# Patient Record
Sex: Female | Born: 1940
Health system: Southern US, Community
[De-identification: ages and names within clinical notes are randomized; demographics above are authoritative.]

## PROBLEM LIST (undated history)

## (undated) DIAGNOSIS — S62309A Unspecified fracture of unspecified metacarpal bone, initial encounter for closed fracture: Secondary | ICD-10-CM

## (undated) DIAGNOSIS — T148XXA Other injury of unspecified body region, initial encounter: Secondary | ICD-10-CM

## (undated) DIAGNOSIS — Z923 Personal history of irradiation: Secondary | ICD-10-CM

## (undated) DIAGNOSIS — T8859XA Other complications of anesthesia, initial encounter: Secondary | ICD-10-CM

## (undated) DIAGNOSIS — Z9289 Personal history of other medical treatment: Secondary | ICD-10-CM

## (undated) DIAGNOSIS — C50919 Malignant neoplasm of unspecified site of unspecified female breast: Secondary | ICD-10-CM

## (undated) DIAGNOSIS — M810 Age-related osteoporosis without current pathological fracture: Secondary | ICD-10-CM

## (undated) DIAGNOSIS — N8501 Benign endometrial hyperplasia: Secondary | ICD-10-CM

## (undated) DIAGNOSIS — E039 Hypothyroidism, unspecified: Secondary | ICD-10-CM

## (undated) DIAGNOSIS — M5020 Other cervical disc displacement, unspecified cervical region: Secondary | ICD-10-CM

## (undated) DIAGNOSIS — T4145XA Adverse effect of unspecified anesthetic, initial encounter: Secondary | ICD-10-CM

## (undated) DIAGNOSIS — N840 Polyp of corpus uteri: Secondary | ICD-10-CM

## (undated) DIAGNOSIS — C4441 Basal cell carcinoma of skin of scalp and neck: Secondary | ICD-10-CM

## (undated) HISTORY — PX: TONSILLECTOMY: SUR1361

## (undated) HISTORY — DX: Personal history of other medical treatment: Z92.89

## (undated) HISTORY — PX: OTHER SURGICAL HISTORY: SHX169

## (undated) HISTORY — DX: Malignant neoplasm of unspecified site of unspecified female breast: C50.919

## (undated) HISTORY — DX: Basal cell carcinoma of skin of scalp and neck: C44.41

## (undated) HISTORY — DX: Personal history of irradiation: Z92.3

## (undated) HISTORY — DX: Benign endometrial hyperplasia: N85.01

## (undated) HISTORY — DX: Polyp of corpus uteri: N84.0

## (undated) HISTORY — DX: Unspecified fracture of unspecified metacarpal bone, initial encounter for closed fracture: S62.309A

## (undated) HISTORY — DX: Age-related osteoporosis without current pathological fracture: M81.0

## (undated) HISTORY — DX: Other injury of unspecified body region, initial encounter: T14.8XXA

## (undated) HISTORY — DX: Hypothyroidism, unspecified: E03.9

---
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
## 1969-04-01 DIAGNOSIS — Z9289 Personal history of other medical treatment: Secondary | ICD-10-CM

## 1969-04-01 HISTORY — DX: Personal history of other medical treatment: Z92.89

## 1991-08-02 DIAGNOSIS — Z8489 Family history of other specified conditions: Secondary | ICD-10-CM

## 1991-08-02 HISTORY — DX: Family history of other specified conditions: Z84.89

## 1997-10-23 ENCOUNTER — Other Ambulatory Visit: Admission: RE | Admit: 1997-10-23 | Discharge: 1997-10-23 | Payer: Self-pay | Admitting: Obstetrics and Gynecology

## 1999-05-21 ENCOUNTER — Encounter: Admission: RE | Admit: 1999-05-21 | Discharge: 1999-05-21 | Payer: Self-pay | Admitting: Obstetrics and Gynecology

## 1999-05-21 ENCOUNTER — Encounter: Payer: Self-pay | Admitting: Obstetrics and Gynecology

## 1999-07-02 HISTORY — PX: HYSTEROSCOPY: SHX211

## 1999-07-06 ENCOUNTER — Ambulatory Visit (HOSPITAL_COMMUNITY): Admission: RE | Admit: 1999-07-06 | Discharge: 1999-07-06 | Payer: Self-pay | Admitting: Obstetrics and Gynecology

## 2000-01-05 ENCOUNTER — Encounter: Payer: Self-pay | Admitting: Obstetrics and Gynecology

## 2000-01-05 ENCOUNTER — Encounter: Admission: RE | Admit: 2000-01-05 | Discharge: 2000-01-05 | Payer: Self-pay | Admitting: Obstetrics and Gynecology

## 2001-06-13 ENCOUNTER — Encounter: Payer: Self-pay | Admitting: Obstetrics and Gynecology

## 2001-06-13 ENCOUNTER — Encounter: Admission: RE | Admit: 2001-06-13 | Discharge: 2001-06-13 | Payer: Self-pay | Admitting: Obstetrics and Gynecology

## 2001-06-21 ENCOUNTER — Other Ambulatory Visit: Admission: RE | Admit: 2001-06-21 | Discharge: 2001-06-21 | Payer: Self-pay | Admitting: Obstetrics and Gynecology

## 2002-06-17 ENCOUNTER — Encounter: Payer: Self-pay | Admitting: Internal Medicine

## 2002-06-17 ENCOUNTER — Encounter: Admission: RE | Admit: 2002-06-17 | Discharge: 2002-06-17 | Payer: Self-pay | Admitting: Internal Medicine

## 2002-07-15 ENCOUNTER — Other Ambulatory Visit: Admission: RE | Admit: 2002-07-15 | Discharge: 2002-07-15 | Payer: Self-pay | Admitting: *Deleted

## 2003-06-25 ENCOUNTER — Encounter: Admission: RE | Admit: 2003-06-25 | Discharge: 2003-06-25 | Payer: Self-pay | Admitting: Internal Medicine

## 2004-07-06 ENCOUNTER — Encounter: Admission: RE | Admit: 2004-07-06 | Discharge: 2004-07-06 | Payer: Self-pay | Admitting: Internal Medicine

## 2004-07-19 ENCOUNTER — Encounter: Admission: RE | Admit: 2004-07-19 | Discharge: 2004-07-19 | Payer: Self-pay | Admitting: Internal Medicine

## 2005-04-15 ENCOUNTER — Other Ambulatory Visit: Admission: RE | Admit: 2005-04-15 | Discharge: 2005-04-15 | Payer: Self-pay | Admitting: *Deleted

## 2005-08-04 ENCOUNTER — Encounter: Admission: RE | Admit: 2005-08-04 | Discharge: 2005-08-04 | Payer: Self-pay | Admitting: Internal Medicine

## 2006-07-10 ENCOUNTER — Encounter: Admission: RE | Admit: 2006-07-10 | Discharge: 2006-07-10 | Payer: Self-pay | Admitting: Internal Medicine

## 2006-08-07 ENCOUNTER — Encounter: Admission: RE | Admit: 2006-08-07 | Discharge: 2006-08-07 | Payer: Self-pay | Admitting: Internal Medicine

## 2007-08-06 ENCOUNTER — Other Ambulatory Visit: Admission: RE | Admit: 2007-08-06 | Discharge: 2007-08-06 | Payer: Self-pay | Admitting: Obstetrics & Gynecology

## 2007-08-09 ENCOUNTER — Encounter: Admission: RE | Admit: 2007-08-09 | Discharge: 2007-08-09 | Payer: Self-pay | Admitting: Internal Medicine

## 2008-07-14 ENCOUNTER — Encounter: Admission: RE | Admit: 2008-07-14 | Discharge: 2008-07-14 | Payer: Self-pay | Admitting: Internal Medicine

## 2008-08-11 ENCOUNTER — Encounter: Admission: RE | Admit: 2008-08-11 | Discharge: 2008-08-11 | Payer: Self-pay | Admitting: Internal Medicine

## 2008-08-11 ENCOUNTER — Other Ambulatory Visit: Admission: RE | Admit: 2008-08-11 | Discharge: 2008-08-11 | Payer: Self-pay | Admitting: Obstetrics & Gynecology

## 2009-08-12 ENCOUNTER — Encounter: Admission: RE | Admit: 2009-08-12 | Discharge: 2009-08-12 | Payer: Self-pay | Admitting: Internal Medicine

## 2010-07-15 ENCOUNTER — Encounter
Admission: RE | Admit: 2010-07-15 | Discharge: 2010-07-15 | Payer: Self-pay | Source: Home / Self Care | Attending: Internal Medicine | Admitting: Internal Medicine

## 2010-08-13 ENCOUNTER — Encounter
Admission: RE | Admit: 2010-08-13 | Discharge: 2010-08-13 | Payer: Self-pay | Source: Home / Self Care | Attending: Internal Medicine | Admitting: Internal Medicine

## 2010-08-22 ENCOUNTER — Encounter: Payer: Self-pay | Admitting: Internal Medicine

## 2010-12-10 ENCOUNTER — Emergency Department (HOSPITAL_BASED_OUTPATIENT_CLINIC_OR_DEPARTMENT_OTHER): Admission: EM | Admit: 2010-12-10 | Payer: Medicare Other | Source: Home / Self Care

## 2010-12-10 ENCOUNTER — Emergency Department (HOSPITAL_BASED_OUTPATIENT_CLINIC_OR_DEPARTMENT_OTHER)
Admission: EM | Admit: 2010-12-10 | Discharge: 2010-12-10 | Disposition: A | Discharge status: Home / Self Care | Payer: Medicare Other | Attending: Emergency Medicine | Admitting: Emergency Medicine

## 2010-12-10 DIAGNOSIS — H81399 Other peripheral vertigo, unspecified ear: Secondary | ICD-10-CM | POA: Insufficient documentation

## 2010-12-10 DIAGNOSIS — M810 Age-related osteoporosis without current pathological fracture: Secondary | ICD-10-CM | POA: Insufficient documentation

## 2010-12-10 DIAGNOSIS — E039 Hypothyroidism, unspecified: Secondary | ICD-10-CM | POA: Insufficient documentation

## 2010-12-17 NOTE — Op Note (Signed)
[facility]  Patient:    Jamie Cole                      MRN: [id number] Proc. Date: 07/06/99 Adm. Date:  [id number] Attending:  Amanda Cockayne                           Operative Report  PREOPERATIVE DIAGNOSIS:       Thick endometrium on ultrasound.  POSTOPERATIVE DIAGNOSIS:      Thick endometrium on ultrasound.  Probably endometrial polyp or fibroid, I could not be sure.  OPERATION:                    Resection of endometrial growth, anterior wall. Suture of cervix.  SURGEON:                      Esmeralda Arthur, M.D.  ASSISTANT:  ANESTHESIA:                   General anesthesia.  PACKS:                        None.  ESTIMATED BLOOD LOSS:  DESCRIPTION OF PROCEDURE:     The patient was carried to the operating room and  after satisfactory general anesthesia, placed in the lithotomy position and prepped and draped in the usual sterile fashion.  Examination revealed the uterus to feel anterior.  I could feel no mass of the adnexa.  A weighted speculum was placed in the posterior vagina.  The patient has a large enterocele, it was hard to see the cervix.  We finally grasped the cervix.  She was easily sounded to 7 cm.  We then dilated her to a #23 Hegar and looked with the  observation scope.  There was an anterior growth coming from the uterus and I think a lateral posterior.  I then could see under this to see the tubal openings.  I  then changed and then dilated her to a #33.  We did tear the cervix anteriorly dilating her because of the pressure.  She has an atrophic cervix.  We then put the resectoscope in, pressure of 90.  We resected this area.  The monitor was fluxuating from 40 to 80 and then when we were through, it suddenly went to 350.  I was not aware of perforating the uterus and I wonder if it was just that the machine was not measuring accurately.  However, we did stop.  We observed the bleeding.   She did not have excess bleeding. She did have some bleeding from the cervix.  So I put two sutures in the anterior cervix of 0 chromic.  The patient tolerated the procedure well and was carried to the recovery room in good condition. DD:  07/06/99 TD:  07/07/99 Job: [id number]

## 2011-07-19 ENCOUNTER — Other Ambulatory Visit: Payer: Self-pay | Admitting: Internal Medicine

## 2011-07-19 DIAGNOSIS — Z1231 Encounter for screening mammogram for malignant neoplasm of breast: Secondary | ICD-10-CM

## 2011-08-08 ENCOUNTER — Encounter: Payer: Self-pay | Admitting: Internal Medicine

## 2011-08-11 DIAGNOSIS — L820 Inflamed seborrheic keratosis: Secondary | ICD-10-CM | POA: Diagnosis not present

## 2011-08-11 DIAGNOSIS — L821 Other seborrheic keratosis: Secondary | ICD-10-CM | POA: Diagnosis not present

## 2011-08-11 DIAGNOSIS — D485 Neoplasm of uncertain behavior of skin: Secondary | ICD-10-CM | POA: Diagnosis not present

## 2011-08-11 DIAGNOSIS — Z411 Encounter for cosmetic surgery: Secondary | ICD-10-CM | POA: Diagnosis not present

## 2011-08-11 DIAGNOSIS — L439 Lichen planus, unspecified: Secondary | ICD-10-CM | POA: Diagnosis not present

## 2011-08-15 ENCOUNTER — Ambulatory Visit
Admission: RE | Admit: 2011-08-15 | Discharge: 2011-08-15 | Disposition: A | Discharge status: Home / Self Care | Payer: Medicare Other | Source: Ambulatory Visit | Attending: Internal Medicine | Admitting: Internal Medicine

## 2011-08-15 DIAGNOSIS — Z1231 Encounter for screening mammogram for malignant neoplasm of breast: Secondary | ICD-10-CM | POA: Diagnosis not present

## 2011-08-19 ENCOUNTER — Other Ambulatory Visit: Payer: Self-pay | Admitting: Internal Medicine

## 2011-08-19 DIAGNOSIS — R928 Other abnormal and inconclusive findings on diagnostic imaging of breast: Secondary | ICD-10-CM

## 2011-08-21 ENCOUNTER — Emergency Department (HOSPITAL_BASED_OUTPATIENT_CLINIC_OR_DEPARTMENT_OTHER)
Admission: EM | Admit: 2011-08-21 | Discharge: 2011-08-22 | Disposition: A | Discharge status: Home / Self Care | Payer: Medicare Other | Attending: Emergency Medicine | Admitting: Emergency Medicine

## 2011-08-21 ENCOUNTER — Encounter (HOSPITAL_BASED_OUTPATIENT_CLINIC_OR_DEPARTMENT_OTHER): Payer: Self-pay | Admitting: *Deleted

## 2011-08-21 DIAGNOSIS — M545 Low back pain, unspecified: Secondary | ICD-10-CM | POA: Insufficient documentation

## 2011-08-21 NOTE — ED Notes (Signed)
Pt states she fell a couple of weeks ago and tonight "almost couldn't get out of the bed"

## 2011-08-21 NOTE — ED Provider Notes (Signed)
History   This chart was scribed for Hanley Seamen, MD by Melba Coon. The patient was seen in room MH04/MH04 and the patient's care was started at 11:35PM.    CSN: [id number]  Arrival date & time 08/21/11  2201   First MD Initiated Contact with Patient 08/21/11 2332      Chief Complaint  Patient presents with  . Back Pain    (Consider location/radiation/quality/duration/timing/severity/associated sxs/prior treatment) HPI Jamie Cole is a 71 y.o. female who presents to the Emergency Department complaining of constant, moderate to severe lower back pain with an onset 2 weeks ago. Pt was involved in a fall forward towards a table and the impact involved her being crunched over the table (did not fall flat). After fall, pt was sore all over but was ambulatory. Pt has been "self-doctoring" her pain with her own pain meds and hot showers, but pt has been having a hard time getting out of bed and her back has been "locking up". Her whole back aches but the pain is worse in her lower back. Sitting up aggravates the pain. No numbness or weakness or change in bowel patterns.    History reviewed. No pertinent past medical history.  Past Surgical History  Procedure Date  . Endometrial polyp   . Tonsillectomy     History reviewed. No pertinent family history.  History  Substance Use Topics  . Smoking status: Never Smoker   . Smokeless tobacco: Not on file  . Alcohol Use: No   Pt is an Tourist information centre manager.  OB History    Grav Para Term Preterm Abortions TAB SAB Ect Mult Living                  Review of Systems 10 Systems reviewed and are negative for acute change except as noted in the HPI.  Allergies  Review of patient's allergies indicates no known allergies.  Home Medications   Current Outpatient Rx  Name Route Sig Dispense Refill  . ASPIRIN EC 81 MG PO TBEC Oral Take 81 mg by mouth daily.    Marland Kitchen LEVOTHYROXINE SODIUM 112 MCG PO TABS Oral Take 112 mcg by  mouth daily.    Marland Kitchen VITAMIN D (ERGOCALCIFEROL) 50000 UNITS PO CAPS Oral Take 50,000 Units by mouth every 14 (fourteen) days.      BP 160/73  Pulse 90  Temp(Src) 97.9 F (36.6 C) (Oral)  Resp 18  Ht 5\' 5"  (1.651 m)  Wt 205 lb (92.987 kg)  BMI 34.11 kg/m2  SpO2 100%  Physical Exam  Constitutional: She is oriented to person, place, and time. She appears well-developed.  HENT:  Head: Normocephalic and atraumatic.  Right Ear: External ear normal.  Left Ear: External ear normal.  Eyes: Conjunctivae and EOM are normal. Pupils are equal, round, and reactive to light. No scleral icterus.  Neck: Normal range of motion. Neck supple. No thyromegaly present.  Cardiovascular: Normal rate, regular rhythm and normal heart sounds.  Exam reveals no gallop and no friction rub.   No murmur heard. Pulmonary/Chest: Effort normal and breath sounds normal. No stridor. She has no wheezes. She has no rales. She exhibits no tenderness.  Abdominal: Soft. She exhibits no distension. There is no tenderness. There is no rebound.  Musculoskeletal: Normal range of motion. She exhibits tenderness (Bilateral sacroiliac tenderness). She exhibits no edema.       Straight leg raise test  Lymphadenopathy:    She has no cervical adenopathy.  Neurological: She is  alert and oriented to person, place, and time. Coordination normal.  Skin: No rash noted. No erythema.  Psychiatric: She has a normal mood and affect. Her behavior is normal.    ED Course  Procedures (including critical care time)  DIAGNOSTIC STUDIES: Oxygen Saturation is 100% on room air, normal by my interpretation.       MDM  Nursing notes and vitals signs, including pulse oximetry, reviewed.  Summary of this visit's results, reviewed by myself:   Imaging Studies: Dg Lumbar Spine Complete  08/22/2011  *RADIOLOGY REPORT*  Clinical Data: Status post fall 2 weeks ago, with lower back pain.  LUMBAR SPINE - COMPLETE 4+ VIEW  Comparison: None.   Findings: There is no evidence of acute fracture or subluxation. Mild grade 1 anterolisthesis of L4 on L5 appears to reflect facet disease.  Vertebral bodies demonstrate normal height and alignment. Intervertebral disc spaces are grossly preserved.  The visualized bowel gas pattern is unremarkable in appearance; air and stool are noted within the colon.  The sacroiliac joints are within normal limits.  IMPRESSION:  1.  No evidence of acute fracture or subluxation along the lumbar spine. 2.  Mild grade 1 anterolisthesis of L4 on L5 appears to reflect underlying facet disease.  Original Report Authenticated By: Tonia Ghent, M.D.      I personally performed the services described in this documentation, which was scribed in my presence.  The recorded information has been reviewed and considered.          Hanley Seamen, MD 08/22/11 [phone]

## 2011-08-22 ENCOUNTER — Emergency Department (INDEPENDENT_AMBULATORY_CARE_PROVIDER_SITE_OTHER): Payer: Medicare Other

## 2011-08-22 DIAGNOSIS — W19XXXA Unspecified fall, initial encounter: Secondary | ICD-10-CM | POA: Diagnosis not present

## 2011-08-22 DIAGNOSIS — M545 Low back pain, unspecified: Secondary | ICD-10-CM | POA: Diagnosis not present

## 2011-08-22 MED ORDER — NAPROXEN 250 MG PO TABS
500.0000 mg | ORAL_TABLET | Freq: Once | ORAL | Status: AC
  Administered 2011-08-22: 500 mg via ORAL
  Filled 2011-08-22: qty 2

## 2011-08-22 MED ORDER — HYDROCODONE-ACETAMINOPHEN 5-325 MG PO TABS
1.0000 | ORAL_TABLET | Freq: Once | ORAL | Status: AC
  Administered 2011-08-22: 1 via ORAL
  Filled 2011-08-22: qty 1

## 2011-08-22 MED ORDER — HYDROCODONE-ACETAMINOPHEN 5-325 MG PO TABS: 1.0000 | ORAL_TABLET | Freq: Four times a day (QID) | ORAL | Status: AC | PRN

## 2011-08-31 ENCOUNTER — Ambulatory Visit
Admission: RE | Admit: 2011-08-31 | Discharge: 2011-08-31 | Disposition: A | Discharge status: Home / Self Care | Payer: BC Managed Care – PPO | Source: Ambulatory Visit | Attending: Internal Medicine | Admitting: Internal Medicine

## 2011-08-31 ENCOUNTER — Other Ambulatory Visit: Payer: Self-pay | Admitting: Internal Medicine

## 2011-08-31 ENCOUNTER — Ambulatory Visit
Admission: RE | Admit: 2011-08-31 | Discharge: 2011-08-31 | Disposition: A | Discharge status: Home / Self Care | Payer: Medicare Other | Source: Ambulatory Visit | Attending: Internal Medicine | Admitting: Internal Medicine

## 2011-08-31 DIAGNOSIS — N63 Unspecified lump in unspecified breast: Secondary | ICD-10-CM

## 2011-08-31 DIAGNOSIS — C50419 Malignant neoplasm of upper-outer quadrant of unspecified female breast: Secondary | ICD-10-CM | POA: Diagnosis not present

## 2011-08-31 DIAGNOSIS — R928 Other abnormal and inconclusive findings on diagnostic imaging of breast: Secondary | ICD-10-CM

## 2011-08-31 DIAGNOSIS — C50919 Malignant neoplasm of unspecified site of unspecified female breast: Secondary | ICD-10-CM

## 2011-08-31 HISTORY — DX: Malignant neoplasm of unspecified site of unspecified female breast: C50.919

## 2011-09-01 ENCOUNTER — Other Ambulatory Visit: Payer: Self-pay | Admitting: Internal Medicine

## 2011-09-01 ENCOUNTER — Ambulatory Visit
Admission: RE | Admit: 2011-09-01 | Discharge: 2011-09-01 | Disposition: A | Discharge status: Home / Self Care | Payer: Medicare Other | Source: Ambulatory Visit | Attending: Internal Medicine | Admitting: Internal Medicine

## 2011-09-01 DIAGNOSIS — N63 Unspecified lump in unspecified breast: Secondary | ICD-10-CM | POA: Diagnosis not present

## 2011-09-01 DIAGNOSIS — C50911 Malignant neoplasm of unspecified site of right female breast: Secondary | ICD-10-CM

## 2011-09-01 DIAGNOSIS — S2000XA Contusion of breast, unspecified breast, initial encounter: Secondary | ICD-10-CM | POA: Diagnosis not present

## 2011-09-01 DIAGNOSIS — D0590 Unspecified type of carcinoma in situ of unspecified breast: Secondary | ICD-10-CM | POA: Diagnosis not present

## 2011-09-02 ENCOUNTER — Telehealth: Payer: Self-pay | Admitting: *Deleted

## 2011-09-02 ENCOUNTER — Other Ambulatory Visit: Payer: Self-pay | Admitting: *Deleted

## 2011-09-02 DIAGNOSIS — C50419 Malignant neoplasm of upper-outer quadrant of unspecified female breast: Secondary | ICD-10-CM

## 2011-09-02 HISTORY — PX: BREAST LUMPECTOMY: SHX2

## 2011-09-02 NOTE — Telephone Encounter (Signed)
Confirmed [facility] for 09/07/11 at 0815 .  Instructions and contact information given.  

## 2011-09-06 ENCOUNTER — Encounter: Payer: Medicare Other | Admitting: Internal Medicine

## 2011-09-06 ENCOUNTER — Ambulatory Visit
Admission: RE | Admit: 2011-09-06 | Discharge: 2011-09-06 | Disposition: A | Discharge status: Home / Self Care | Payer: Medicare Other | Source: Ambulatory Visit | Attending: Internal Medicine | Admitting: Internal Medicine

## 2011-09-06 DIAGNOSIS — C50419 Malignant neoplasm of upper-outer quadrant of unspecified female breast: Secondary | ICD-10-CM | POA: Diagnosis not present

## 2011-09-06 DIAGNOSIS — N63 Unspecified lump in unspecified breast: Secondary | ICD-10-CM | POA: Diagnosis not present

## 2011-09-06 DIAGNOSIS — C50911 Malignant neoplasm of unspecified site of right female breast: Secondary | ICD-10-CM

## 2011-09-06 MED ORDER — GADOBENATE DIMEGLUMINE 529 MG/ML IV SOLN: 19.0000 mL | Freq: Once | INTRAVENOUS | Status: AC | PRN

## 2011-09-07 ENCOUNTER — Encounter: Payer: Self-pay | Admitting: Oncology

## 2011-09-07 ENCOUNTER — Ambulatory Visit: Payer: BC Managed Care – PPO

## 2011-09-07 ENCOUNTER — Ambulatory Visit (HOSPITAL_BASED_OUTPATIENT_CLINIC_OR_DEPARTMENT_OTHER): Payer: BC Managed Care – PPO | Admitting: General Surgery

## 2011-09-07 ENCOUNTER — Encounter (INDEPENDENT_AMBULATORY_CARE_PROVIDER_SITE_OTHER): Payer: Self-pay | Admitting: General Surgery

## 2011-09-07 ENCOUNTER — Other Ambulatory Visit: Payer: Self-pay | Admitting: Internal Medicine

## 2011-09-07 ENCOUNTER — Ambulatory Visit: Payer: Medicare Other | Attending: General Surgery | Admitting: Physical Therapy

## 2011-09-07 ENCOUNTER — Encounter: Payer: Self-pay | Admitting: *Deleted

## 2011-09-07 ENCOUNTER — Ambulatory Visit
Admission: RE | Admit: 2011-09-07 | Discharge: 2011-09-07 | Disposition: A | Discharge status: Home / Self Care | Payer: BC Managed Care – PPO | Source: Ambulatory Visit | Attending: Radiation Oncology | Admitting: Radiation Oncology

## 2011-09-07 ENCOUNTER — Other Ambulatory Visit: Payer: BC Managed Care – PPO | Admitting: Lab

## 2011-09-07 ENCOUNTER — Ambulatory Visit (HOSPITAL_BASED_OUTPATIENT_CLINIC_OR_DEPARTMENT_OTHER): Payer: BC Managed Care – PPO | Admitting: Oncology

## 2011-09-07 VITALS — BP 150/84 | HR 81 | Temp 97.8°F | Ht 64.0 in | Wt 206.7 lb

## 2011-09-07 DIAGNOSIS — M25619 Stiffness of unspecified shoulder, not elsewhere classified: Secondary | ICD-10-CM | POA: Diagnosis not present

## 2011-09-07 DIAGNOSIS — C50419 Malignant neoplasm of upper-outer quadrant of unspecified female breast: Secondary | ICD-10-CM

## 2011-09-07 DIAGNOSIS — E039 Hypothyroidism, unspecified: Secondary | ICD-10-CM

## 2011-09-07 DIAGNOSIS — IMO0001 Reserved for inherently not codable concepts without codable children: Secondary | ICD-10-CM | POA: Diagnosis not present

## 2011-09-07 DIAGNOSIS — R293 Abnormal posture: Secondary | ICD-10-CM | POA: Insufficient documentation

## 2011-09-07 DIAGNOSIS — Z9181 History of falling: Secondary | ICD-10-CM | POA: Diagnosis not present

## 2011-09-07 LAB — COMPREHENSIVE METABOLIC PANEL
Albumin: 3.9 g/dL (ref 3.5–5.2)
BUN: 18 mg/dL (ref 6–23)
CO2: 28 mEq/L (ref 19–32)
Calcium: 9.9 mg/dL (ref 8.4–10.5)
Chloride: 102 mEq/L (ref 96–112)
Glucose, Bld: 110 mg/dL — ABNORMAL HIGH (ref 70–99)
Potassium: 3.8 mEq/L (ref 3.5–5.3)

## 2011-09-07 LAB — CBC WITH DIFFERENTIAL/PLATELET
Basophils Absolute: 0.1 10*3/uL (ref 0.0–0.1)
Eosinophils Absolute: 0.2 10*3/uL (ref 0.0–0.5)
HGB: 14.7 g/dL (ref 11.6–15.9)
MCV: 94.8 fL (ref 79.5–101.0)
MONO#: 0.6 10*3/uL (ref 0.1–0.9)
NEUT#: 3.8 10*3/uL (ref 1.5–6.5)
RDW: 13.3 % (ref 11.2–14.5)
WBC: 6.9 10*3/uL (ref 3.9–10.3)
lymph#: 2.2 10*3/uL (ref 0.9–3.3)

## 2011-09-07 NOTE — Progress Notes (Signed)
[facility] Radiation Oncology NEW PATIENT EVALUATION  Name: Jamie Cole MRN: [id number]  Date: 09/07/2011  DOB: June 18, 1941  Status: outpatient   CC: No primary provider on file.  Ernestene Mention, MD , Dr. Pierce Crane   REFERRING PHYSICIAN: Ernestene Mention, MD   DIAGNOSIS: Clinical stage I (T1, N0, M0) invasive ductal carcinoma the right breast   HISTORY OF PRESENT ILLNESS:  Jamie Cole is a 71 y.o. female who is seen today at the [facility]. for evaluation of her stage I (T1, N0, M0) invasive ductal carcinoma of the right breast at the time of a screening mammogram on 08/15/2011 she is due to have a possible mass within the right breast. Additional views and ultrasound on 08/31/2011 showed a 6 mm irregular mass with associated calcifications within the upper outer quadrant of the right breast. Calcifications extended 1 cm anterior to the mass. An ultrasound-guided biopsy on 10 10/29/2011 revealed invasive ductal carcinoma with associated calcifications and DCIS present breast MRI on 09/06/2011 showed a 0.9 cm solitary enhancing focus was associated clip artifact within the upper-outer quadrant of the right breast. Her skin that her tumor is ER/PR positive. I do not have this in print. She is seen today along with Dr. Derrell Lolling and Dr. Donnie Coffin   PREVIOUS RADIATION THERAPY: She had 4 kilovoltage treatments to her face for acne back in 1957 and [facility].   PAST MEDICAL HISTORY:  has a past medical history of Skin cancer; Endometrial hyperplasia, simple; Endometrial polyp; Wears glasses; and Hypothyroidism.    PAST SURGICAL HISTORY:  Past Surgical History  Procedure Date  . Endometrial polyp   . Tonsillectomy      FAMILY HISTORY: family history includes Cancer in her cousins and paternal aunt and Cancer (age of onset:70) in her father and paternal grandmother. Her father died following a stroke at 60 and her mother died of what sounds like pulmonary  emboli at age 22.   SOCIAL HISTORY:  reports that she has never smoked. She does not have any smokeless tobacco history on file. She reports that she does not drink alcohol or use illicit drugs. 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 Married, one son.   ALLERGIES: Review of patient's allergies indicates no known allergies.   MEDICATIONS:  Current Outpatient Prescriptions  Medication Sig Dispense Refill  . Ascorbic Acid (VITAMIN C) 1000 MG tablet Take 1,000 mg by mouth daily.      Marland Kitchen aspirin EC 81 MG tablet Take 81 mg by mouth daily.      . calcium citrate-vitamin D (CITRACAL+D) 315-200 MG-UNIT per tablet Take 1 tablet by mouth 2 (two) times daily.      . fish oil-omega-3 fatty acids 1000 MG capsule Take 1 g by mouth daily.      . Flaxseed, Linseed, (FLAX SEEDS PO) Take 1,000 mg by mouth daily.      Marland Kitchen glucosamine-chondroitin 500-400 MG tablet Take 1 tablet by mouth 3 (three) times daily.      Marland Kitchen levothyroxine (LEVOTHROID) 112 MCG tablet Take 112 mcg by mouth daily.      . Methylcellulose, Laxative, (CITRUCEL PO) Take 4 capsules by mouth daily.      . Multiple Vitamin (MULTIVITAMIN) capsule Take 1 capsule by mouth daily.      . Nutritional Supplements (GRAPESEED EXTRACT PO) Take by mouth.      . RESVERATROL PO Take 500 mg by mouth daily.      . Vitamin D, Ergocalciferol, (DRISDOL) 50000  UNITS CAPS Take 50,000 Units by mouth every 14 (fourteen) days.       No current facility-administered medications for this encounter.   Facility-Administered Medications Ordered in Other Encounters  Medication Dose Route Frequency Provider Last Rate Last Dose  . gadobenate dimeglumine (MULTIHANCE) injection 19 mL  19 mL Intravenous Once PRN Medication Radiologist, MD          REVIEW OF SYSTEMS:  Pertinent items are noted in HPI.    PE: Alert and oriented 71 year old female appearing younger than her stated age. Vital signs: BP 150/84, P. 81, RR 20, T. 97.8 Head and neck: Grossly unremarkable. Nodes: Without  palpable cervical or supraclavicular lymphadenopathy. Chest: Lungs clear. Back: Without spinal CVA tenderness. Heart: Regular rate and rhythm. Breasts: There is a punctate biopsy wound at approximately 9:30 to 10:00 within the upper-outer quadrant of the right breast. There is a superficial 8mm nodule noted which could represent tumor or small hematoma from her recent biopsy. There is surrounding ecchymosis. No other masses are appreciated. Left breast without masses or lesions. Abdomen without masses or organomegaly. Extremities without edema. Neurologic examination grossly nonfocal.    LABORATORY DATA:  Lab Results  Component Value Date   WBC 6.9 09/07/2011   HGB 14.7 09/07/2011   HCT 43.7 09/07/2011   MCV 94.8 09/07/2011   PLT 274 09/07/2011   Lab Results  Component Value Date   NA 140 09/07/2011   K 3.8 09/07/2011   CL 102 09/07/2011   CO2 28 09/07/2011   Lab Results  Component Value Date   ALT 13 09/07/2011   AST 15 09/07/2011   ALKPHOS 118* 09/07/2011   BILITOT 0.2* 09/07/2011      IMPRESSION: Clinical stage I (T1, N0, M0) invasive ductal carcinoma the right breast. I explained to the patient and her husband that her local treatment options include mastectomy or partial mastectomy with or without radiation therapy or with without adjuvant hormone therapy. In view of her good performance status with the tail of expectancy, I would offer her hypo-fractionated radiation therapy following a partial mastectomy. She may also take adjuvant hormone following radiation therapy. I discussed the potential acute and late toxicities of radiation therapy, she has been she did breast preservation. I can see her for a followup visit following her definitive surgery with Dr. Derrell Lolling.   PLAN: As discussed above.   I spent 40 minutes minutes face to face with the patient and more than 50% of that time was spent in counseling and/or coordination of care.

## 2011-09-07 NOTE — Progress Notes (Signed)
Patient ID: Jamie Cole, female   DOB: [birth year], 71 y.o.   MRN: [id number]  Chief Complaint  Patient presents with  . Breast Cancer Long Term Follow Up    HPI Jamie Cole is a 71 y.o. female.  The patient was referred to the breast multidisciplinary clinic by Dr. Kerry Kass at the breast and arrange for.  The patient has not had any breast problems in the past. She is generally healthy. Recent screening mammogram showed a 6 mm mass in the upper outer quadrant of the right breast with faint calcifications extending 1 cm anteriorly.  Image guided biopsy revealed invasive ductal carcinoma which was estrogen receptor positive, HER-2-negative, Ki-67 approximately 15%. She has had an MRI which shows a 9 mm focus in the right breast upper outer quadrant, and this is a completely solitary finding. There was a small cyst in the left lobe of the liver.  Family history is positive for breast cancer in 2 aunts and 5 cousins. She has no sisters. She has one son.  She is being seen at the [facility] with  Dr. Chipper Herb and Dr. Pierce Crane. HPI  Past Medical History  Diagnosis Date  . Skin cancer     no additional information known  . Endometrial hyperplasia, simple   . Endometrial polyp   . Wears glasses   . Hypothyroidism     Past Surgical History  Procedure Date  . Endometrial polyp   . Tonsillectomy     Family History  Problem Relation Age of Onset  . Cancer Father 28    "skin cancer"  . Cancer Paternal Aunt     paternal aunts x 2 with breast ca; approx 70 at dx  . Cancer Paternal Grandmother 25    gastric ca  . Cancer Cousin     5 paternal cousins with breast ca; ages 34-60 at dx  . Cancer Cousin     maternal cousins with prostate & tonsillar ca    Social History History  Substance Use Topics  . Smoking status: Never Smoker   . Smokeless tobacco: Not on file  . Alcohol Use: No    No Known Allergies  Current Outpatient Prescriptions  Medication Sig  Dispense Refill  . Ascorbic Acid (VITAMIN C) 1000 MG tablet Take 1,000 mg by mouth daily.      Marland Kitchen aspirin EC 81 MG tablet Take 81 mg by mouth daily.      . calcium citrate-vitamin D (CITRACAL+D) 315-200 MG-UNIT per tablet Take 1 tablet by mouth 2 (two) times daily.      . fish oil-omega-3 fatty acids 1000 MG capsule Take 1 g by mouth daily.      . Flaxseed, Linseed, (FLAX SEEDS PO) Take 1,000 mg by mouth daily.      Marland Kitchen glucosamine-chondroitin 500-400 MG tablet Take 1 tablet by mouth 3 (three) times daily.      Marland Kitchen levothyroxine (LEVOTHROID) 112 MCG tablet Take 112 mcg by mouth daily.      . Methylcellulose, Laxative, (CITRUCEL PO) Take 4 capsules by mouth daily.      . Multiple Vitamin (MULTIVITAMIN) capsule Take 1 capsule by mouth daily.      . Nutritional Supplements (GRAPESEED EXTRACT PO) Take by mouth.      . RESVERATROL PO Take 500 mg by mouth daily.      . Vitamin D, Ergocalciferol, (DRISDOL) 50000 UNITS CAPS Take 50,000 Units by mouth every 14 (fourteen) days.  No current facility-administered medications for this visit.   Facility-Administered Medications Ordered in Other Visits  Medication Dose Route Frequency Provider Last Rate Last Dose  . gadobenate dimeglumine (MULTIHANCE) injection 19 mL  19 mL Intravenous Once PRN Medication Radiologist, MD        Review of Systems Review of Systems  Constitutional: Negative for fever, chills and unexpected weight change.  HENT: Negative for hearing loss, congestion, sore throat, trouble swallowing and voice change.   Eyes: Negative for visual disturbance.  Respiratory: Negative for cough and wheezing.   Cardiovascular: Negative for chest pain, palpitations and leg swelling.  Gastrointestinal: Negative for nausea, vomiting, abdominal pain, diarrhea, constipation, blood in stool, abdominal distention and anal bleeding.  Genitourinary: Negative for hematuria, vaginal bleeding and difficulty urinating.  Musculoskeletal: Negative for  arthralgias.  Skin: Negative for rash and wound.  Neurological: Negative for seizures, syncope and headaches.  Hematological: Negative for adenopathy. Does not bruise/bleed easily.  Psychiatric/Behavioral: Negative for confusion.    There were no vitals taken for this visit.  Physical Exam Physical Exam  Constitutional: She is oriented to person, place, and time. She appears well-developed and well-nourished. No distress.  HENT:  Head: Normocephalic and atraumatic.  Nose: Nose normal.  Mouth/Throat: No oropharyngeal exudate.  Eyes: Conjunctivae and EOM are normal. Pupils are equal, round, and reactive to light. Left eye exhibits no discharge. No scleral icterus.  Neck: Neck supple. No JVD present. No tracheal deviation present. No thyromegaly present.  Cardiovascular: Normal rate, regular rhythm, normal heart sounds and intact distal pulses.   No murmur heard. Pulmonary/Chest: Effort normal and breath sounds normal. No respiratory distress. She has no wheezes. She has no rales. She exhibits no tenderness.         Right breast reveals a small bruise at the 10:00 position. There is no mass in either breast. There is no other skin changes. There is no axillary adenopathy.  Abdominal: Soft. Bowel sounds are normal. She exhibits no distension and no mass. There is no tenderness. There is no rebound and no guarding.  Musculoskeletal: She exhibits no edema and no tenderness.  Lymphadenopathy:    She has no cervical adenopathy.  Neurological: She is alert and oriented to person, place, and time. She exhibits normal muscle tone. Coordination normal.  Skin: Skin is warm. No rash noted. She is not diaphoretic. No erythema. No pallor.  Psychiatric: She has a normal mood and affect. Her behavior is normal. Judgment and thought content normal.    Data Reviewed I have reviewed all of her imaging studies and her pathology slides. I have discussed her care at the breast conference this morning and  at the clinic with Dr. Donnie Coffin and Dr. Dayton Scrape.  Assessment    Invasive ductal carcinoma right breast, 9 mm diameter, upper outer quadrant, clinical stage T1b., N0. ER positive. HER-2 negative. Ki-67 15%  Family history of breast cancer in 7 relatives.    Plan    I had a lengthy discussion with the patient, her husband, and her son. We discussed partial mastectomy and sentinel load biopsy, total mastectomy and sentinel node biopsy, reconstruction following mastectomy. She took some time to make a decision, but at the end of the clinic she clearly wanted to proceed with right partial mastectomy and sentinel lead biopsy. We will schedule that surgery in the near future.  She will be referred to the genetics counselor at this time.  I discussed the indications and details and risks of the proposed surgery. Risks  and complications have been outlined, including but limited to bleeding, infection, cosmetic deformity, reoperation for positive margins were positive nodes, skin necrosis, nerve damage chronic pain. She understands these issues. Her questions were answered. She agrees with this plan.       [name]. Derrell Lolling, M.D., [facility] General and Minimally invasive Surgery Breast and Colorectal Surgery Office:   [phone] Pager:   [phone]  09/07/2011, 11:33 AM

## 2011-09-07 NOTE — Progress Notes (Signed)
Referral MD  Dr Leda Quail; Dr Guerry Bruin; Dr Campbell Stall   Reason for Referral: Breast cancer  HPI  : This is a delightful 71-year-old woman here today with her family for discussion of her recent diagnosis of breast cancer. She does undergo annual screening mammography. She did not take any abnormalities in her breast. A screening mammogram 08/15/2011 showed a possible mass in the right breast additional views were recommended. Last mammogram right breast ultrasound performed 08/31/2011 showed a suspicious 5 x 6 x 6 mm mass in the upper outer right breast with calcifications and a biopsy was recommended. Biopsy. performed 08/31/2011 demonstrated a invasive ductal cancer likely grade 2 HER-2 was negative the ratio 1.56 the tumor was strongly ER and PR positive. An MRI scans performed to 09/06/11 showed a mass in the right breast measuring 9 x 9 x 8 mm. No other lesions were seen. There is no other evidence of adenopathy. A 1 cm lesion in the left lobe of the liver was seen likely a cyst .   Past Medical History  Diagnosis Date  . Skin cancer     no additional information known  . Endometrial hyperplasia, simple   . Endometrial polyp   . Wears glasses   . Hypothyroidism   :  Past Surgical History  Procedure Date  . Endometrial polyp   . Tonsillectomy   :  Current outpatient prescriptions:levothyroxine (LEVOTHROID) 112 MCG tablet, Take 112 mcg by mouth daily., Disp: , Rfl: ;  Ascorbic Acid (VITAMIN C) 1000 MG tablet, Take 1,000 mg by mouth daily., Disp: , Rfl: ;  aspirin EC 81 MG tablet, Take 81 mg by mouth daily., Disp: , Rfl: ;  calcium citrate-vitamin D (CITRACAL+D) 315-200 MG-UNIT per tablet, Take 1 tablet by mouth 2 (two) times daily., Disp: , Rfl:  fish oil-omega-3 fatty acids 1000 MG capsule, Take 1 g by mouth daily., Disp: , Rfl: ;  Flaxseed, Linseed, (FLAX SEEDS PO), Take 1,000 mg by mouth daily., Disp: , Rfl: ;  glucosamine-chondroitin 500-400 MG tablet, Take 1 tablet by  mouth 3 (three) times daily., Disp: , Rfl: ;  Methylcellulose, Laxative, (CITRUCEL PO), Take 4 capsules by mouth daily., Disp: , Rfl:  Multiple Vitamin (MULTIVITAMIN) capsule, Take 1 capsule by mouth daily., Disp: , Rfl: ;  Nutritional Supplements (GRAPESEED EXTRACT PO), Take by mouth., Disp: , Rfl: ;  RESVERATROL PO, Take 500 mg by mouth daily., Disp: , Rfl: ;  Vitamin D, Ergocalciferol, (DRISDOL) 50000 UNITS CAPS, Take 50,000 Units by mouth every 14 (fourteen) days., Disp: , Rfl:  No current facility-administered medications for this visit. Facility-Administered Medications Ordered in Other Visits: gadobenate dimeglumine (MULTIHANCE) injection 19 mL, 19 mL, Intravenous, Once PRN, Medication Radiologist, MD:    :  No Known Allergies:  Family History  Problem Relation Age of Onset  . Cancer Father 36    "skin cancer"  . Cancer Paternal Aunt     paternal aunts x 2 with breast ca; approx 70 at dx  . Cancer Paternal Grandmother 74    gastric ca  . Cancer Cousin     5 paternal cousins with breast ca; ages 69-60 at dx  . Cancer Cousin     maternal cousins with prostate & tonsillar ca  :  History   Social History  . Marital Status: Married x40 y , retired Runner, broadcasting/film/video    Spouse Name: N/A    Number of Children: X 1   . Years of Education: N/A   Occupational  History  . Not on file.  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 Smoking status: Never Smoker   . Smokeless tobacco: Not on file  . Alcohol Use: No  . Drug Use: No  . Sexually Active: Yes    Birth Control/ Protection: Post-menopausal   Other Topics Concern  REPRO Hx      G1P1, menarche 58, menopause 18 HRT x 2-3y   Social History Narrative  . No narrative on file  :  A comprehensive review of systems was negative.  Exam: @IPVITALS @ General appearance: alert, cooperative and appears stated age Eyes: conjunctivae/corneas clear. PERRL, EOM's intact. Fundi benign. Throat: lips, mucosa,  and tongue normal; teeth and gums normal Resp: clear to auscultation bilaterally and normal percussion bilaterally Breasts: normal appearance, no masses or tenderness, Inspection negative, s/p Biopsy Cardio: regular rate and rhythm, S1, S2 normal, no murmur, click, rub or gallop and prominent apical impulse GI: soft, non-tender; bowel sounds normal; no masses,  no organomegaly Extremities: extremities normal, atraumatic, no cyanosis or edema Lymph nodes: Cervical, supraclavicular, and axillary nodes normal. Neurologic: Grossly normal   Basename 09/07/11 0805  WBC 6.9  HGB 14.7  HCT 43.7  PLT 274    Basename 09/07/11 0805  NA 140  K 3.8  CL 102  CO2 28  GLUCOSE 110*  BUN 18  CREATININE 0.68  CALCIUM 9.9    Blood smear review: n/a  Pathology:as above   Dg Lumbar Spine Complete  08/22/2011  *RADIOLOGY REPORT*  Clinical Data: Status post fall 2 weeks ago, with lower back pain.  LUMBAR SPINE - COMPLETE 4+ VIEW  Comparison: None.  Findings: There is no evidence of acute fracture or subluxation. Mild grade 1 anterolisthesis of L4 on L5 appears to reflect facet disease.  Vertebral bodies demonstrate normal height and alignment. Intervertebral disc spaces are grossly preserved.  The visualized bowel gas pattern is unremarkable in appearance; air and stool are noted within the colon.  The sacroiliac joints are within normal limits.  IMPRESSION:  1.  No evidence of acute fracture or subluxation along the lumbar spine. 2.  Mild grade 1 anterolisthesis of L4 on L5 appears to reflect underlying facet disease.  Original Report Authenticated By: Tonia Ghent, M.D.   US Breast Right  08/31/2011  *RADIOLOGY REPORT*  Clinical Data:  71 year old female with abnormal screening mammogram - new right breast mass.  DIGITAL DIAGNOSTIC RIGHT MAMMOGRAM  AND RIGHT BREAST ULTRASOUND:  Comparison:  08/15/2011 and prior mammograms dating back to 08/04/2005  Findings:  CC and MLO spot compression views of the  right breast demonstrate a 6 mm irregular mass with associated calcifications in the upper outer right breast.  There are faint calcifications in the linear configuration extending 1 cm anterior to the mass.  On physical exam, no palpable abnormalities identified in the upper outer right breast  Ultrasound is performed, showing a 5 x 6 x 6 mm irregular hypoechoic mass with echogenic rim and posterior acoustic shadowing located at the 10 o'clock position of the right breast 7 cm from the nipple. No enlarged or abnormal-appearing right axillary lymph nodes are identified.  IMPRESSION: Suspicious 5 x 6 x 6 mm irregular mass in the upper outer right breast with faint calcifications extending 1 cm anteriorly.  Tissue sampling is recommended.  This finding was discussed with the patient and she desires to proceed with ultrasound guided right breast biopsy at this time.  BI-RADS CATEGORY 4:  Suspicious abnormality -  biopsy should be considered.  Recommend ultrasound guided biopsy of the right breast, which will be performed today but dictated in a separate report.  Original Report Authenticated By: Rosendo Gros, M.D.   Mr Breast Bilateral W Wo Contrast  09/06/2011  *RADIOLOGY REPORT*  Clinical Data: Recently diagnosed right breast carcinoma. Preoperative planning.  BUN and creatinine were obtained on site at [facility] at [address] Results:  BUN 12 mg/dL,  Creatinine 0.7 mg/dL.  BILATERAL BREAST MRI WITH AND WITHOUT CONTRAST  Technique: Multiplanar, multisequence MR images of both breasts were obtained prior to and following the intravenous administration of 19ml of Multihance.  Three dimensional images were evaluated at the independent DynaCad workstation.  Comparison:  08/31/2011, 08/15/2011, 08/13/2010, 08/12/2009, 08/11/2008 mammograms.  Findings: There is a small irregular enhancing mass located within the upper-outer quadrant of the posterior one third of the right breast with associated central  clip artifact corresponding to the recently diagnosed right breast carcinoma.  This measures 9 x 9 x 8 mm in size.  There is mild adjacent post biopsy change present. There are no additional worrisome enhancing foci within the right breast and no worrisome enhancing foci within the left breast. There is no evidence for adenopathy.  There is a 1 cm in size due to right lesion within the left lobe of the liver most consistent an incidental cyst.  IMPRESSION:  1.  9 mm solitary enhancing focus with associated clip artifact within the upper-outer quadrant of the right breast corresponding to a recently diagnosed right breast carcinoma. 2.  1 cm T2 and inversion recovery bright lesion within the left lobe of the liver most consistent with an incidental hepatic cyst.  THREE-DIMENSIONAL MR IMAGE RENDERING ON INDEPENDENT WORKSTATION:  Three-dimensional MR images were rendered by post-processing of the original MR data on an independent workstation.  The three- dimensional MR images were interpreted, and findings were reported in the accompanying complete MRI report for this study.  BI-RADS CATEGORY 6:  Known biopsy-proven malignancy - appropriate action should be taken.  Original Report Authenticated By: Rolla Plate, M.D.   [place] Core Biopsy  08/31/2011  *RADIOLOGY REPORT*  Clinical Data:  71 year old female with suspicious 6 mm mass in the upper outer right breast - for tissue sampling  ULTRASOUND GUIDED VACUUM ASSISTED CORE BIOPSY OF THE RIGHT BREAST  Comparison: Prior mammograms  I met with the patient and we discussed the procedure of ultrasound- guided biopsy, including benefits and alternatives.  We discussed the high likelihood of a successful procedure. We discussed the risks of the procedure, including infection, bleeding, tissue injury, clip migration, and inadequate sampling.  Informed, written consent was given.  Using sterile technique, 2% lidocaine, ultrasound guidance, and a 12 gauge vacuum assisted  needle, biopsy was performed of the 5 x 6 mm irregular hypoechoic mass at the 10 o'clock position of the right breast 7 cm from the nipple.  At the conclusion of the procedure, a tissue marker clip was deployed into the biopsy cavity.  Follow-up 2-view mammogram was performed and dictated separately.  IMPRESSION: Ultrasound-guided biopsy of right breast mass.  No apparent complications.  Pathology will be followed.  Original Report Authenticated By: Rosendo Gros, M.D.   Mm Digital Diag Ltd R  08/31/2011  *RADIOLOGY REPORT*  Clinical Data:  71 year old female with abnormal screening mammogram - new right breast mass.  DIGITAL DIAGNOSTIC RIGHT MAMMOGRAM  AND RIGHT BREAST ULTRASOUND:  Comparison:  08/15/2011 and prior mammograms dating back to 08/04/2005  Findings:  CC and MLO spot compression views of the right breast demonstrate a 6 mm irregular mass with associated calcifications in the upper outer right breast.  There are faint calcifications in the linear configuration extending 1 cm anterior to the mass.  On physical exam, no palpable abnormalities identified in the upper outer right breast  Ultrasound is performed, showing a 5 x 6 x 6 mm irregular hypoechoic mass with echogenic rim and posterior acoustic shadowing located at the 10 o'clock position of the right breast 7 cm from the nipple. No enlarged or abnormal-appearing right axillary lymph nodes are identified.  IMPRESSION: Suspicious 5 x 6 x 6 mm irregular mass in the upper outer right breast with faint calcifications extending 1 cm anteriorly.  Tissue sampling is recommended.  This finding was discussed with the patient and she desires to proceed with ultrasound guided right breast biopsy at this time.  BI-RADS CATEGORY 4:  Suspicious abnormality - biopsy should be considered.  Recommend ultrasound guided biopsy of the right breast, which will be performed today but dictated in a separate report.  Original Report Authenticated By: Rosendo Gros, M.D.     Mm Digital Diagnostic Unilat R  08/31/2011  *RADIOLOGY REPORT*  Clinical Data:  Evaluate clip placement following ultrasound guided right breast biopsy.  DIGITAL DIAGNOSTIC RIGHT MAMMOGRAM  Comparison:  Prior studies  Findings:  Films are performed following ultrasound guided biopsy of the 5 x 6 mm irregular mass in the upper outer right breast. The biopsy clip is in satisfactory position. Minimal postbiopsy hemorrhage is identified.  IMPRESSION: Satisfactory clip placement following ultrasound guided right breast biopsy.  Pathology will be followed.  Original Report Authenticated By: Rosendo Gros, M.D.   Mm Digital Screening  08/16/2011  DG SCREEN MAMMOGRAM BILATERAL Bilateral CC and MLO view(s) were taken.  DIGITAL SCREENING MAMMOGRAM WITH CAD: The breast tissue is almost entirely fatty.  A possible mass is noted in the right breast.  Spot  compression views and possibly sonography are recommended for further evaluation.  In the left  breast, no masses or malignant type calcifications are identified.  Compared with prior studies.  Images were processed with CAD.  IMPRESSION: Possible mass, right breast.  Additional evaluation is indicated.  The patient will be contacted  for additional studies and a supplementary report will follow.  No specific mammographic evidence  of malignancy, left breast.  ASSESSMENT: Need additional imaging evaluation and/or prior mammograms for comparison - BI-RADS 0  Further imaging of the right breast. ,   Mm Radiologist Eval And Mgmt  09/01/2011  *RADIOLOGY REPORT*  ESTABLISHED PATIENT OFFICE VISIT - LEVEL II ([id number])  Chief Complaint:  Status post ultrasound-guided core needle biopsy of a 6 mm mass in the 10 o'clock position of the right breast.  History:  The patient reports no pain at the biopsy site.  Exam:  The patient has an approximately 3 cm rounded area of bruising at the biopsy site in the upper outer right breast.  There is no palpable hematoma.  She was not  tender to palpation in that area.  Assessment and Plan:  The final pathological diagnosis is invasive ductal carcinoma and ductal carcinoma in situ.  This is concordant with the imaging findings.  This was discussed with the patient, her husband and her son.  Their questions were answered.  She was given an appointment for a bilateral breast MR without and with contrast at [facility] at [address] at 9:15 a.m.  on 09/06/2011.  She will be contacted by the Multidisciplinary Clinic for an appointment.  Original Report Authenticated By: Darrol Angel, M.D.    Assessment and Plan : Delightful 71 year old woman who presents with a low-grade ER/PR positive lesion in her breast. She was seen today by the radiation oncologist and surgeon as well. I discussed the option of giving her adjuvant hormonal therapy. I do not believe that she will require chemotherapy given the nature of her cancer. I have also discussed side effects of anti-estrogen therapy as well. She has had recent bone density test as well.  I plan to see her again in followup after she has had her surgery to discuss the option of adjuvant antiestrogen therapy more fully. I should mention that a bone density test in December 2011 percent she showed mild  osteopenia in the spine T score -1.7 and a normal bone density in the left hip.   55 minutes was spent with this patient half the time and patient-related counseling  Pierce Crane M.D., [place] [facility]  09/07/2011

## 2011-09-07 NOTE — Patient Instructions (Signed)
You have a small invasive cancer in the upper outer quadrant of your right breast. My office will call you tomorrow to schedule surgery. The surgery will be a right partial mastectomy with needle localization and right axillary sentinel lymph node biopsy.  Lumpectomy, Breast Conserving Surgery A lumpectomy is breast surgery that removes only part of the breast. Another name used may be partial mastectomy. The amount removed varies. Make sure you understand how much of your breast will be removed. Reasons for a lumpectomy:  Any solid breast mass.   Grouped significant nodularity that may be confused with a solitary breast mass.  Lumpectomy is the most common form of breast cancer surgery today. The surgeon removes the portion of your breast which contains the tumor (cancer). This is the lump. Some normal tissue around the lump is also removed to be sure that all the tumor has been removed.  If cancer cells are found in the margins where the breast tissue was removed, your surgeon will do more surgery to remove the remaining cancer tissue. This is called re-excision surgery. Radiation and/or chemotherapy treatments are often given following a lumpectomy to kill any cancer cells that could possibly remain.  REASONS YOU MAY NOT BE ABLE TO HAVE BREAST CONSERVING SURGERY:  The tumor is located in more than one place.   Your breast is small and the tumor is large so the breast would be disfigured.   The entire tumor removal is not successful with a lumpectomy.   You cannot commit to a full course of chemotherapy, radiation therapy or are pregnant and cannot have radiation.   You have previously had radiation to the breast to treat cancer.  HOW A LUMPECTOMY IS PERFORMED If overnight nursing is not required following a biopsy, a lumpectomy can be performed as a same-day surgery. This can be done in a hospital, clinic, or surgical center. The anesthesia used will depend on your surgeon. They will  discuss this with you. A general anesthetic keeps you sleeping through the procedure. LET YOUR CAREGIVERS KNOW ABOUT THE FOLLOWING:  Allergies   Medications taken including herbs, eye drops, over the counter medications, and creams.   Use of steroids (by mouth or creams)   Previous problems with anesthetics or Novocaine.   Possibility of pregnancy, if this applies   History of blood clots (thrombophlebitis)   History of bleeding or blood problems.   Previous surgery   Other health problems  BEFORE THE PROCEDURE You should be present one hour prior to your procedure unless directed otherwise.  AFTER THE PROCEDURE  After surgery, you will be taken to the recovery area where a nurse will watch and check your progress. Once you're awake, stable, and taking fluids well, barring other problems you will be allowed to go home.   Ice packs applied to your operative site may help with discomfort and keep the swelling down.   A small rubber drain may be placed in the breast for a couple of days to prevent a hematoma from developing in the breast.   A pressure dressing may be applied for 24 to 48 hours to prevent bleeding.   Keep the wound dry.   You may resume a normal diet and activities as directed. Avoid strenuous activities affecting the arm on the side of the biopsy site such as tennis, swimming, heavy lifting (more than 10 pounds) or pulling.   Bruising in the breast is normal following this procedure.   Wearing a bra - even to  bed - may be more comfortable and also help keep the dressing on.   Change dressings as directed.   Only take over-the-counter or prescription medicines for pain, discomfort, or fever as directed by your caregiver.  Call for your results as instructed by your surgeon. Remember it is your responsibility to get the results of your lumpectomy if your surgeon asked you to follow-up. Do not assume everything is fine if you have not heard from your  caregiver. SEEK MEDICAL CARE IF:   There is increased bleeding (more than a small spot) from the wound.   You notice redness, swelling, or increasing pain in the wound.   Pus is coming from wound.   An unexplained oral temperature above 102 F (38.9 C) develops.   You notice a foul smell coming from the wound or dressing.  SEEK IMMEDIATE MEDICAL CARE IF:   You develop a rash.   You have difficulty breathing.   You have any allergic problems.  Document Released: 08/29/2006 Document Revised: 03/30/2011 Document Reviewed: 11/30/2006 [facility] Patient Information 2012 [place], [place].

## 2011-09-09 ENCOUNTER — Ambulatory Visit: Payer: BC Managed Care – PPO | Admitting: Lab

## 2011-09-09 ENCOUNTER — Other Ambulatory Visit (INDEPENDENT_AMBULATORY_CARE_PROVIDER_SITE_OTHER): Payer: Self-pay | Admitting: General Surgery

## 2011-09-09 ENCOUNTER — Ambulatory Visit: Payer: BC Managed Care – PPO

## 2011-09-09 ENCOUNTER — Other Ambulatory Visit: Payer: Self-pay | Admitting: Internal Medicine

## 2011-09-09 ENCOUNTER — Encounter: Payer: Self-pay | Admitting: *Deleted

## 2011-09-09 ENCOUNTER — Telehealth: Payer: Self-pay | Admitting: *Deleted

## 2011-09-09 ENCOUNTER — Telehealth (INDEPENDENT_AMBULATORY_CARE_PROVIDER_SITE_OTHER): Payer: Self-pay

## 2011-09-09 DIAGNOSIS — Z1231 Encounter for screening mammogram for malignant neoplasm of breast: Secondary | ICD-10-CM

## 2011-09-09 DIAGNOSIS — C50919 Malignant neoplasm of unspecified site of unspecified female breast: Secondary | ICD-10-CM | POA: Diagnosis not present

## 2011-09-09 DIAGNOSIS — C50419 Malignant neoplasm of upper-outer quadrant of unspecified female breast: Secondary | ICD-10-CM

## 2011-09-09 DIAGNOSIS — Z803 Family history of malignant neoplasm of breast: Secondary | ICD-10-CM | POA: Diagnosis not present

## 2011-09-09 NOTE — Telephone Encounter (Signed)
Spoke to pt concerning [facility] on 2/6.  Pt relate understanding of dx and treatment care plan.  Denies questions or concerns at this time.  Encourage pt to call with needs.  Received verbal understanding.  Contact information given.  Surgery is scheduled for 2/13.

## 2011-09-09 NOTE — Progress Notes (Signed)
Pt seen for genetic counseling.  Blood drawn for BRCA 1/2 

## 2011-09-09 NOTE — Telephone Encounter (Signed)
LMOM that I am returning pts call. I have asked pt to call me back and have me paged. She wants to know if partial mastectomy and lumpectomy are the same procedure.

## 2011-09-09 NOTE — Progress Notes (Signed)
Mailed after appt letter to pt. 

## 2011-09-12 ENCOUNTER — Ambulatory Visit
Admission: RE | Admit: 2011-09-12 | Discharge: 2011-09-12 | Disposition: A | Discharge status: Home / Self Care | Payer: BC Managed Care – PPO | Source: Ambulatory Visit | Attending: General Surgery | Admitting: General Surgery

## 2011-09-12 ENCOUNTER — Encounter: Payer: Self-pay | Admitting: *Deleted

## 2011-09-12 ENCOUNTER — Encounter (HOSPITAL_BASED_OUTPATIENT_CLINIC_OR_DEPARTMENT_OTHER): Payer: Self-pay | Admitting: *Deleted

## 2011-09-12 ENCOUNTER — Encounter (HOSPITAL_BASED_OUTPATIENT_CLINIC_OR_DEPARTMENT_OTHER)
Admission: RE | Admit: 2011-09-12 | Discharge: 2011-09-12 | Disposition: A | Discharge status: Home / Self Care | Payer: BC Managed Care – PPO | Source: Ambulatory Visit | Attending: General Surgery | Admitting: General Surgery

## 2011-09-12 ENCOUNTER — Encounter: Payer: Self-pay | Admitting: Specialist

## 2011-09-12 ENCOUNTER — Telehealth: Payer: Self-pay | Admitting: Oncology

## 2011-09-12 DIAGNOSIS — I517 Cardiomegaly: Secondary | ICD-10-CM | POA: Diagnosis not present

## 2011-09-12 DIAGNOSIS — Z170 Estrogen receptor positive status [ER+]: Secondary | ICD-10-CM | POA: Diagnosis not present

## 2011-09-12 DIAGNOSIS — Z01811 Encounter for preprocedural respiratory examination: Secondary | ICD-10-CM | POA: Diagnosis not present

## 2011-09-12 DIAGNOSIS — E039 Hypothyroidism, unspecified: Secondary | ICD-10-CM | POA: Diagnosis not present

## 2011-09-12 DIAGNOSIS — C50419 Malignant neoplasm of upper-outer quadrant of unspecified female breast: Secondary | ICD-10-CM | POA: Diagnosis not present

## 2011-09-12 LAB — COMPREHENSIVE METABOLIC PANEL
BUN: 14 mg/dL (ref 6–23)
Calcium: 9.6 mg/dL (ref 8.4–10.5)
Creatinine, Ser: 0.55 mg/dL (ref 0.50–1.10)
GFR calc Af Amer: >90 mL/min (ref >90)
Glucose, Bld: 92 mg/dL (ref 70–99)
Sodium: 141 mEq/L (ref 135–145)
Total Protein: 7.1 g/dL (ref 6.0–8.3)

## 2011-09-12 LAB — CBC
HCT: 44.1 % (ref 36.0–46.0)
Hemoglobin: 15.1 g/dL — ABNORMAL HIGH (ref 12.0–15.0)
MCH: 32.1 pg (ref 26.0–34.0)
MCHC: 34.2 g/dL (ref 30.0–36.0)
MCV: 93.6 fL (ref 78.0–100.0)

## 2011-09-12 NOTE — Progress Notes (Signed)
I saw the patient in the [facility] on 09/07/2011.  She was accompanied by her husband and adult son and appeared to be dealing well with her diagnosis and proposed treatment plan.  I told Jamie Cole about the Breast Cancer Support Group, gave her a support program calendar, and at her request, I have made a referral to [facility].

## 2011-09-12 NOTE — Telephone Encounter (Signed)
S/w the pt and she is aware of her feb appts. °

## 2011-09-12 NOTE — Progress Notes (Signed)
To come in for labs,ekg,cxr

## 2011-09-13 ENCOUNTER — Encounter: Payer: Self-pay | Admitting: *Deleted

## 2011-09-13 NOTE — H&P (Signed)
Jamie Cole    MRN: [id number]   Description: 71 year old female  Provider: Ernestene Mention, MD  Department: [facility]    Diagnoses     Cancer of upper-outer quadrant of female breast   - Primary    174.4    Hypothyroid     244.9        History and physical   Ernestene Mention, MD   Patient ID: Jamie Cole, female   DOB: 07-14-1941, 71 y.o.   MRN: [id number]    Chief Complaint   Patient presents with   .  Breast Cancer Long Term Follow Up      HPI Jamie Cole is a 71 y.o. female.  The patient was referred to the breast multidisciplinary clinic by Dr. Zella Ball at the [facility] [place].  The patient has not had any breast problems in the past. She is generally healthy. Recent screening mammogram showed a 6 mm mass in the upper outer quadrant of the right breast with faint calcifications extending 1 cm anteriorly.  Image guided biopsy revealed invasive ductal carcinoma which was estrogen receptor positive, HER-2-negative, Ki-67 approximately 15%. She has had an MRI which shows a 9 mm focus in the right breast upper outer quadrant, and this is a completely solitary finding. There was a small cyst in the left lobe of the liver.  Family history is positive for breast cancer in 2 aunts and 5 cousins. She has no sisters. She has one son.  She is being seen at the [facility] with  Dr. Chipper Herb and Dr. Pierce Crane.    Past Medical History   Diagnosis  Date   .  Skin cancer         no additional information known   .  Endometrial hyperplasia, simple     .  Endometrial polyp     .  Wears glasses     .  Hypothyroidism         Past Surgical History   Procedure  Date   .  Endometrial polyp     .  Tonsillectomy         Family History   Problem  Relation  Age of Onset   .  Cancer  Father  25       "skin cancer"   .  Cancer  Paternal Aunt         paternal aunts x 2 with breast ca; approx 70 at dx   .  Cancer  Paternal Grandmother   42       gastric ca   .  Cancer  Cousin         5 paternal cousins with breast ca; ages 77-60 at dx   .  Cancer  Cousin         maternal cousins with prostate & tonsillar ca     Social History History   Substance Use Topics   .  Smoking status:  Never Smoker    .  Smokeless tobacco:  Not on file   .  Alcohol Use:  No     No Known Allergies    Current Outpatient Prescriptions   Medication  Sig  Dispense  Refill   .  Ascorbic Acid (VITAMIN C) 1000 MG tablet  Take 1,000 mg by mouth daily.         Marland Kitchen  aspirin EC 81 MG tablet  Take 81 mg by mouth daily.         Marland Kitchen  calcium citrate-vitamin D (CITRACAL+D) 315-200 MG-UNIT per tablet  Take 1 tablet by mouth 2 (two) times daily.         .  fish oil-omega-3 fatty acids 1000 MG capsule  Take 1 g by mouth daily.         .  Flaxseed, Linseed, (FLAX SEEDS PO)  Take 1,000 mg by mouth daily.         Marland Kitchen  glucosamine-chondroitin 500-400 MG tablet  Take 1 tablet by mouth 3 (three) times daily.         Marland Kitchen  levothyroxine (LEVOTHROID) 112 MCG tablet  Take 112 mcg by mouth daily.         .  Methylcellulose, Laxative, (CITRUCEL PO)  Take 4 capsules by mouth daily.         .  Multiple Vitamin (MULTIVITAMIN) capsule  Take 1 capsule by mouth daily.         .  Nutritional Supplements (GRAPESEED EXTRACT PO)  Take by mouth.         .  RESVERATROL PO  Take 500 mg by mouth daily.         .  Vitamin D, Ergocalciferol, (DRISDOL) 50000 UNITS CAPS  Take 50,000 Units by mouth every 14 (fourteen) days.             No current facility-administered medications for this visit.       Facility-Administered Medications Ordered in Other Visits   Medication  Dose  Route  Frequency  Provider  Last Rate  Last Dose   .  gadobenate dimeglumine (MULTIHANCE) injection 19 mL   19 mL  Intravenous  Once PRN  Medication Radiologist, MD            Review of Systems   Constitutional: Negative for fever, chills and unexpected weight change.  HENT: Negative for hearing loss,  congestion, sore throat, trouble swallowing and voice change.   Eyes: Negative for visual disturbance.  Respiratory: Negative for cough and wheezing.   Cardiovascular: Negative for chest pain, palpitations and leg swelling.  Gastrointestinal: Negative for nausea, vomiting, abdominal pain, diarrhea, constipation, blood in stool, abdominal distention and anal bleeding.  Genitourinary: Negative for hematuria, vaginal bleeding and difficulty urinating.  Musculoskeletal: Negative for arthralgias.  Skin: Negative for rash and wound.  Neurological: Negative for seizures, syncope and headaches.  Hematological: Negative for adenopathy. Does not bruise/bleed easily.  Psychiatric/Behavioral: Negative for confusion.    There were no vitals taken for this visit.   Physical Exam   Constitutional: She is oriented to person, place, and time. She appears well-developed and well-nourished. No distress.  HENT:   Head: Normocephalic and atraumatic.   Nose: Nose normal.   Mouth/Throat: No oropharyngeal exudate.  Eyes: Conjunctivae and EOM are normal. Pupils are equal, round, and reactive to light. Left eye exhibits no discharge. No scleral icterus.  Neck: Neck supple. No JVD present. No tracheal deviation present. No thyromegaly present.  Cardiovascular: Normal rate, regular rhythm, normal heart sounds and intact distal pulses.    No murmur heard. Pulmonary/Chest: Effort normal and breath sounds normal. No respiratory distress. She has no wheezes. She has no rales. She exhibits no tenderness.        Right breast reveals a small bruise at the 10:00 position. There is no mass in either breast. There is no other skin changes. There is no axillary adenopathy.  Abdominal: Soft. Bowel sounds are normal. She exhibits no distension and no mass. There is no tenderness. There is  no rebound and no guarding.  Musculoskeletal: She exhibits no edema and no tenderness.  Lymphadenopathy:    She has no cervical  adenopathy.  Neurological: She is alert and oriented to person, place, and time. She exhibits normal muscle tone. Coordination normal.  Skin: Skin is warm. No rash noted. She is not diaphoretic. No erythema. No pallor.  Psychiatric: She has a normal mood and affect. Her behavior is normal. Judgment and thought content normal.    Data Reviewed I have reviewed all of her imaging studies and her pathology slides. I have discussed her care at the breast conference this morning and at the clinic with Dr. Donnie Coffin and Dr. Dayton Scrape.   Assessment Invasive ductal carcinoma right breast, 9 mm diameter, upper outer quadrant, clinical stage T1b., N0. ER positive. HER-2 negative. Ki-67 15%  Family history of breast cancer in 7 relatives.   Plan I had a lengthy discussion with the patient, her husband, and her son. We discussed partial mastectomy and sentinel load biopsy, total mastectomy and sentinel node biopsy, reconstruction following mastectomy. She took some time to make a decision, but at the end of the clinic she clearly wanted to proceed with right partial mastectomy and sentinel lead biopsy. We will schedule that surgery in the near future.  She will be referred to the genetics counselor at this time.  I discussed the indications and details and risks of the proposed surgery. Risks and complications have been outlined, including but limited to bleeding, infection, cosmetic deformity, reoperation for positive margins or positive nodes, skin necrosis, nerve damage, chronic pain. She understands these issues. Her questions were answered. She agrees with this plan.    [name]. Derrell Lolling, M.D., [facility] General and Minimally invasive Surgery Breast and Colorectal Surgery Office:   [phone] Pager:   [phone]

## 2011-09-14 ENCOUNTER — Ambulatory Visit
Admission: RE | Admit: 2011-09-14 | Discharge: 2011-09-14 | Disposition: A | Discharge status: Home / Self Care | Payer: Medicare Other | Source: Ambulatory Visit | Attending: General Surgery | Admitting: General Surgery

## 2011-09-14 ENCOUNTER — Encounter (HOSPITAL_BASED_OUTPATIENT_CLINIC_OR_DEPARTMENT_OTHER): Payer: Self-pay | Admitting: Anesthesiology

## 2011-09-14 ENCOUNTER — Other Ambulatory Visit (INDEPENDENT_AMBULATORY_CARE_PROVIDER_SITE_OTHER): Payer: Self-pay | Admitting: General Surgery

## 2011-09-14 ENCOUNTER — Ambulatory Visit (HOSPITAL_BASED_OUTPATIENT_CLINIC_OR_DEPARTMENT_OTHER): Payer: Medicare Other | Admitting: Anesthesiology

## 2011-09-14 ENCOUNTER — Ambulatory Visit (HOSPITAL_COMMUNITY)
Admission: RE | Admit: 2011-09-14 | Discharge: 2011-09-14 | Disposition: A | Discharge status: Home / Self Care | Payer: Medicare Other | Source: Ambulatory Visit | Attending: General Surgery | Admitting: General Surgery

## 2011-09-14 ENCOUNTER — Encounter (HOSPITAL_BASED_OUTPATIENT_CLINIC_OR_DEPARTMENT_OTHER): Payer: Self-pay | Admitting: *Deleted

## 2011-09-14 ENCOUNTER — Ambulatory Visit (HOSPITAL_BASED_OUTPATIENT_CLINIC_OR_DEPARTMENT_OTHER)
Admission: RE | Admit: 2011-09-14 | Discharge: 2011-09-14 | Disposition: A | Discharge status: Home / Self Care | Payer: Medicare Other | Source: Ambulatory Visit | Attending: General Surgery | Admitting: General Surgery

## 2011-09-14 ENCOUNTER — Encounter (HOSPITAL_BASED_OUTPATIENT_CLINIC_OR_DEPARTMENT_OTHER)
Admission: RE | Disposition: A | Discharge status: Home / Self Care | Payer: Self-pay | Source: Ambulatory Visit | Attending: General Surgery

## 2011-09-14 DIAGNOSIS — C50419 Malignant neoplasm of upper-outer quadrant of unspecified female breast: Secondary | ICD-10-CM

## 2011-09-14 DIAGNOSIS — C50919 Malignant neoplasm of unspecified site of unspecified female breast: Secondary | ICD-10-CM

## 2011-09-14 DIAGNOSIS — Z170 Estrogen receptor positive status [ER+]: Secondary | ICD-10-CM | POA: Insufficient documentation

## 2011-09-14 DIAGNOSIS — E039 Hypothyroidism, unspecified: Secondary | ICD-10-CM | POA: Diagnosis not present

## 2011-09-14 DIAGNOSIS — D0590 Unspecified type of carcinoma in situ of unspecified breast: Secondary | ICD-10-CM | POA: Diagnosis not present

## 2011-09-14 HISTORY — DX: Other complications of anesthesia, initial encounter: T88.59XA

## 2011-09-14 HISTORY — PX: MASTECTOMY, PARTIAL: SHX709

## 2011-09-14 LAB — URINALYSIS, ROUTINE W REFLEX MICROSCOPIC
Leukocytes, UA: NEGATIVE
Nitrite: NEGATIVE
Specific Gravity, Urine: >1.03 — ABNORMAL HIGH (ref 1.005–1.030)
pH: 5 (ref 5.0–8.0)

## 2011-09-14 SURGERY — PARTIAL MASTECTOMY WITH NEEDLE LOCALIZATION AND AXILLARY SENTINEL LYMPH NODE BX
Anesthesia: General | Site: Breast | Laterality: Right | Wound class: Clean

## 2011-09-14 MED ORDER — ACETAMINOPHEN 10 MG/ML IV SOLN
1000.0000 mg | Freq: Once | INTRAVENOUS | Status: AC
  Administered 2011-09-14: 1000 mg via INTRAVENOUS

## 2011-09-14 MED ORDER — FENTANYL CITRATE 0.05 MG/ML IJ SOLN
INTRAMUSCULAR | Status: DC | PRN
  Administered 2011-09-14 (×2): 25 ug via INTRAVENOUS
  Administered 2011-09-14 (×2): 50 ug via INTRAVENOUS

## 2011-09-14 MED ORDER — DEXAMETHASONE SODIUM PHOSPHATE 4 MG/ML IJ SOLN
INTRAMUSCULAR | Status: DC | PRN
  Administered 2011-09-14: 10 mg via INTRAVENOUS

## 2011-09-14 MED ORDER — SODIUM CHLORIDE 0.9 % IJ SOLN
INTRAMUSCULAR | Status: DC | PRN
  Administered 2011-09-14: 3 mL via INTRAVENOUS

## 2011-09-14 MED ORDER — PROMETHAZINE HCL 25 MG/ML IJ SOLN: 6.2500 mg | INTRAMUSCULAR | Status: DC | PRN

## 2011-09-14 MED ORDER — CHLORHEXIDINE GLUCONATE 4 % EX LIQD: 1.0000 "application " | Freq: Once | CUTANEOUS | Status: DC

## 2011-09-14 MED ORDER — LACTATED RINGERS IV SOLN
INTRAVENOUS | Status: DC
  Administered 2011-09-14: 11:00:00 via INTRAVENOUS
  Administered 2011-09-14: 20 mL/h via INTRAVENOUS
  Administered 2011-09-14: 12:00:00 via INTRAVENOUS

## 2011-09-14 MED ORDER — CEFAZOLIN SODIUM-DEXTROSE 2-3 GM-% IV SOLR
2.0000 g | INTRAVENOUS | Status: AC
  Administered 2011-09-14: 2 g via INTRAVENOUS

## 2011-09-14 MED ORDER — HYDROMORPHONE HCL PF 1 MG/ML IJ SOLN
0.2500 mg | INTRAMUSCULAR | Status: DC | PRN
  Administered 2011-09-14 (×3): 0.5 mg via INTRAVENOUS

## 2011-09-14 MED ORDER — MIDAZOLAM HCL 2 MG/2ML IJ SOLN
1.0000 mg | INTRAMUSCULAR | Status: DC | PRN
  Administered 2011-09-14: 2 mg via INTRAVENOUS

## 2011-09-14 MED ORDER — TECHNETIUM TC 99M SULFUR COLLOID FILTERED
1.0000 | Freq: Once | INTRAVENOUS | Status: AC | PRN
  Administered 2011-09-14: 1 via INTRADERMAL

## 2011-09-14 MED ORDER — HYDROCODONE-ACETAMINOPHEN 5-325 MG PO TABS: 1.0000 | ORAL_TABLET | ORAL | Status: AC | PRN

## 2011-09-14 MED ORDER — PROPOFOL 10 MG/ML IV EMUL
INTRAVENOUS | Status: DC | PRN
  Administered 2011-09-14: 120 mg via INTRAVENOUS

## 2011-09-14 MED ORDER — MIDAZOLAM HCL 2 MG/2ML IJ SOLN: 1.0000 mg | INTRAMUSCULAR | Status: DC | PRN

## 2011-09-14 MED ORDER — METHYLENE BLUE 1 % INJ SOLN
INTRAMUSCULAR | Status: DC | PRN
  Administered 2011-09-14: 2 mL via SUBMUCOSAL

## 2011-09-14 MED ORDER — LIDOCAINE HCL (CARDIAC) 20 MG/ML IV SOLN
INTRAVENOUS | Status: DC | PRN
  Administered 2011-09-14: 100 mg via INTRAVENOUS

## 2011-09-14 MED ORDER — HEPARIN SODIUM (PORCINE) 5000 UNIT/ML IJ SOLN
5000.0000 [IU] | Freq: Once | INTRAMUSCULAR | Status: AC
  Administered 2011-09-14: 5000 [IU] via SUBCUTANEOUS

## 2011-09-14 MED ORDER — FENTANYL CITRATE 0.05 MG/ML IJ SOLN
50.0000 ug | INTRAMUSCULAR | Status: DC | PRN
  Administered 2011-09-14: 100 ug via INTRAVENOUS

## 2011-09-14 MED ORDER — ONDANSETRON HCL 4 MG/2ML IJ SOLN
INTRAMUSCULAR | Status: DC | PRN
  Administered 2011-09-14: 4 mg via INTRAVENOUS

## 2011-09-14 MED ORDER — BUPIVACAINE-EPINEPHRINE 0.5% -1:200000 IJ SOLN
INTRAMUSCULAR | Status: DC | PRN
  Administered 2011-09-14: 18 mL

## 2011-09-14 MED ORDER — SCOPOLAMINE 1 MG/3DAYS TD PT72
1.0000 | MEDICATED_PATCH | Freq: Once | TRANSDERMAL | Status: DC
  Administered 2011-09-14: 1.5 mg via TRANSDERMAL

## 2011-09-14 MED ORDER — LORAZEPAM 2 MG/ML IJ SOLN: 1.0000 mg | Freq: Once | INTRAMUSCULAR | Status: DC | PRN

## 2011-09-14 SURGICAL SUPPLY — 68 items
ADH SKN CLS APL DERMABOND .7 (GAUZE/BANDAGES/DRESSINGS) ×1
APL SKNCLS STERI-STRIP NONHPOA (GAUZE/BANDAGES/DRESSINGS)
APPLIER CLIP 11 MED OPEN (CLIP)
APR CLP MED 11 20 MLT OPN (CLIP)
BANDAGE ELASTIC 6 VELCRO ST LF (GAUZE/BANDAGES/DRESSINGS) IMPLANT
BENZOIN TINCTURE PRP APPL 2/3 (GAUZE/BANDAGES/DRESSINGS) IMPLANT
BINDER BREAST XLRG (GAUZE/BANDAGES/DRESSINGS) ×1 IMPLANT
BLADE HEX COATED 2.75 (ELECTRODE) ×2 IMPLANT
BLADE SURG 10 STRL SS (BLADE) ×2 IMPLANT
BLADE SURG 15 STRL LF DISP TIS (BLADE) ×2 IMPLANT
BLADE SURG 15 STRL SS (BLADE) ×4
CANISTER SUCTION 1200CC (MISCELLANEOUS) ×2 IMPLANT
CHLORAPREP W/TINT 26ML (MISCELLANEOUS) ×2 IMPLANT
CLIP APPLIE 11 MED OPEN (CLIP) IMPLANT
CLIP TI MEDIUM 6 (CLIP) ×2 IMPLANT
CLOTH BEACON ORANGE TIMEOUT ST (SAFETY) ×2 IMPLANT
COVER MAYO STAND STRL (DRAPES) ×2 IMPLANT
COVER PROBE W GEL 5X96 (DRAPES) ×2 IMPLANT
COVER TABLE BACK 60X90 (DRAPES) ×2 IMPLANT
DECANTER SPIKE VIAL GLASS SM (MISCELLANEOUS) IMPLANT
DERMABOND ADVANCED (GAUZE/BANDAGES/DRESSINGS) ×1
DERMABOND ADVANCED .7 DNX12 (GAUZE/BANDAGES/DRESSINGS) IMPLANT
DEVICE DUBIN W/COMP PLATE 8390 (MISCELLANEOUS) ×2 IMPLANT
DRAIN CHANNEL 19F RND (DRAIN) IMPLANT
DRAIN HEMOVAC 1/8 X 5 (WOUND CARE) IMPLANT
DRAPE LAPAROSCOPIC ABDOMINAL (DRAPES) ×2 IMPLANT
DRAPE UTILITY XL STRL (DRAPES) ×2 IMPLANT
DRSG PAD ABDOMINAL 8X10 ST (GAUZE/BANDAGES/DRESSINGS) IMPLANT
ELECT REM PT RETURN 9FT ADLT (ELECTROSURGICAL) ×2
ELECTRODE REM PT RTRN 9FT ADLT (ELECTROSURGICAL) ×1 IMPLANT
EVACUATOR SILICONE 100CC (DRAIN) IMPLANT
GAUZE SPONGE 4X4 12PLY STRL LF (GAUZE/BANDAGES/DRESSINGS) IMPLANT
GAUZE SPONGE 4X4 16PLY XRAY LF (GAUZE/BANDAGES/DRESSINGS) IMPLANT
GLOVE EUDERMIC 7 POWDERFREE (GLOVE) ×2 IMPLANT
GLOVE SKINSENSE NS SZ7.0 (GLOVE) ×1
GLOVE SKINSENSE STRL SZ7.0 (GLOVE) IMPLANT
GOWN PREVENTION PLUS XLARGE (GOWN DISPOSABLE) ×2 IMPLANT
GOWN PREVENTION PLUS XXLARGE (GOWN DISPOSABLE) ×2 IMPLANT
KIT MARKER MARGIN INK (KITS) ×2 IMPLANT
NDL HYPO 25X1 1.5 SAFETY (NEEDLE) ×2 IMPLANT
NDL SAFETY ECLIPSE 18X1.5 (NEEDLE) ×1 IMPLANT
NEEDLE HYPO 18GX1.5 SHARP (NEEDLE) ×2
NEEDLE HYPO 25X1 1.5 SAFETY (NEEDLE) ×4 IMPLANT
NS IRRIG 1000ML POUR BTL (IV SOLUTION) ×2 IMPLANT
PACK BASIN DAY SURGERY FS (CUSTOM PROCEDURE TRAY) ×2 IMPLANT
PAD ALCOHOL SWAB (MISCELLANEOUS) ×2 IMPLANT
PENCIL BUTTON HOLSTER BLD 10FT (ELECTRODE) ×2 IMPLANT
PIN SAFETY STERILE (MISCELLANEOUS) IMPLANT
SLEEVE SCD COMPRESS KNEE MED (MISCELLANEOUS) ×1 IMPLANT
SPONGE GAUZE 4X4 12PLY (GAUZE/BANDAGES/DRESSINGS) ×2 IMPLANT
SPONGE LAP 18X18 X RAY DECT (DISPOSABLE) IMPLANT
SPONGE LAP 4X18 X RAY DECT (DISPOSABLE) ×2 IMPLANT
STAPLER VISISTAT 35W (STAPLE) ×2 IMPLANT
STRIP CLOSURE SKIN 1/2X4 (GAUZE/BANDAGES/DRESSINGS) IMPLANT
SUT ETHILON 3 0 FSL (SUTURE) IMPLANT
SUT MNCRL AB 4-0 PS2 18 (SUTURE) ×4 IMPLANT
SUT SILK 2 0 SH (SUTURE) ×2 IMPLANT
SUT VIC AB 2-0 CT1 27 (SUTURE)
SUT VIC AB 2-0 CT1 TAPERPNT 27 (SUTURE) IMPLANT
SUT VIC AB 3-0 SH 27 (SUTURE) ×2
SUT VIC AB 3-0 SH 27X BRD (SUTURE) IMPLANT
SUT VICRYL 3-0 CR8 SH (SUTURE) ×1 IMPLANT
SYR CONTROL 10ML LL (SYRINGE) ×4 IMPLANT
TOWEL OR 17X24 6PK STRL BLUE (TOWEL DISPOSABLE) ×3 IMPLANT
TOWEL OR NON WOVEN STRL DISP B (DISPOSABLE) ×2 IMPLANT
TUBE CONNECTING 20X1/4 (TUBING) ×2 IMPLANT
WATER STERILE IRR 1000ML POUR (IV SOLUTION) ×2 IMPLANT
YANKAUER SUCT BULB TIP NO VENT (SUCTIONS) ×2 IMPLANT

## 2011-09-14 NOTE — Discharge Instructions (Signed)
Keep ice pack on wounds intermittently for 36 hours. You may shower in 36 hours. Do not take a tub bath.  You may drive a car in 2 days. No sports or heavy lifting for 2 weeks.  We should be able to call fthe pathology report to you within 48 hours.  See detailed instructions below.   [facility] [employer] Office Phone Number [phone]  BREAST BIOPSY/ PARTIAL MASTECTOMY: POST OP INSTRUCTIONS  Always review your discharge instruction sheet given to you by the facility where your surgery was performed.  IF YOU HAVE DISABILITY OR FAMILY LEAVE FORMS, YOU MUST BRING THEM TO THE OFFICE FOR PROCESSING.  DO NOT GIVE THEM TO YOUR DOCTOR.  1. A prescription for pain medication may be given to you upon discharge.  Take your pain medication as prescribed, if needed.  If narcotic pain medicine is not needed, then you may take acetaminophen (Tylenol) or ibuprofen (Advil) as needed. 2. Take your usually prescribed medications unless otherwise directed 3. If you need a refill on your pain medication, please contact your pharmacy.  They will contact our office to request authorization.  Prescriptions will not be filled after 5pm or on week-ends. 4. You should eat very light the first 24 hours after surgery, such as soup, crackers, pudding, etc.  Resume your normal diet the day after surgery. 5. Most patients will experience some swelling and bruising in the breast.  Ice packs and a good support bra will help.  Swelling and bruising can take several days to resolve.  6. It is common to experience some constipation if taking pain medication after surgery.  Increasing fluid intake and taking a stool softener will usually help or prevent this problem from occurring.  A mild laxative (Milk of Magnesia or Miralax) should be taken according to package directions if there are no bowel movements after 48 hours. 7. Unless discharge instructions indicate otherwise, you may remove your bandages 24-48 hours after  surgery, and you may shower at that time.  You may have steri-strips (small skin tapes) in place directly over the incision.  These strips should be left on the skin for 7-10 days.  If your surgeon used skin glue on the incision, you may shower in 24 hours.  The glue will flake off over the next 2-3 weeks.  Any sutures or staples will be removed at the office during your follow-up visit. 8. ACTIVITIES:  You may resume regular daily activities (gradually increasing) beginning the next day.  Wearing a good support bra or sports bra minimizes pain and swelling.  You may have sexual intercourse when it is comfortable. a. You may drive when you no longer are taking prescription pain medication, you can comfortably wear a seatbelt, and you can safely maneuver your car and apply brakes. b. RETURN TO WORK:  ______________________________________________________________________________________ 9. You should see your doctor in the office for a follow-up appointment approximately two weeks after your surgery.  Your doctor's nurse will typically make your follow-up appointment when she calls you with your pathology report.  Expect your pathology report 2-3 business days after your surgery.  You may call to check if you do not hear from [place] after three days. 10. OTHER INSTRUCTIONS: _______________________________________________________________________________________________ _____________________________________________________________________________________________________________________________________ _____________________________________________________________________________________________________________________________________ _____________________________________________________________________________________________________________________________________  WHEN TO CALL YOUR DOCTOR: 1. Fever over 101.0 2. Nausea and/or vomiting. 3. Extreme swelling or bruising. 4. Continued bleeding from  incision. 5. Increased pain, redness, or drainage from the incision.  The clinic staff is available to answer your questions  during regular business hours.  Please don't hesitate to call and ask to speak to one of the nurses for clinical concerns.  If you have a medical emergency, go to the nearest emergency room or call 911.  A surgeon from [facility] is always on call at the hospital.  For further questions, please visit [email or web address]   [facility]  [address] [place], [place] [address] [phone]      Post Anesthesia Home Care Instructions  Activity: Get plenty of rest for the remainder of the day. A responsible adult should stay with you for 24 hours following the procedure.  For the next 24 hours, DO NOT: -Drive a car -Advertising copywriter -Drink alcoholic beverages -Take any medication unless instructed by your physician -Make any legal decisions or sign important papers.  Meals: Start with liquid foods such as gelatin or soup. Progress to regular foods as tolerated. Avoid greasy, spicy, heavy foods. If nausea and/or vomiting occur, drink only clear liquids until the nausea and/or vomiting subsides. Call your physician if vomiting continues.  Special Instructions/Symptoms: Your throat may feel dry or sore from the anesthesia or the breathing tube placed in your throat during surgery. If this causes discomfort, gargle with warm salt water. The discomfort should disappear within 24 hours.      Call your surgeon if you experience:   1.  Fever over 101.0. 2.  Inability to urinate. 3.  Nausea and/or vomiting. 4.  Extreme swelling or bruising at the surgical site. 5.  Continued bleeding from the incision. 6.  Increased pain, redness or drainage from the incision. 7.  Problems related to your pain medication.

## 2011-09-14 NOTE — Interval H&P Note (Signed)
History and Physical Interval Note:  09/14/2011 11:23 AM  Jamie Cole  has presented today for surgery, with the diagnosis of Invasive ductal carcinoma right breast   The goals of treatment and the various methods of treatment have been discussed with the patient and family. After consideration of risks, benefits and other options for treatment, the patient has consented to  Procedure(s) (LRB): RIGHT PARTIAL MASTECTOMY WITH NEEDLE LOCALIZATION AND AXILLARY SENTINEL LYMPH NODE BX (Right) as a surgical intervention .  The patients' history has been reviewed, patient examined, no change in status, stable for surgery.  I have reviewed the patients' chart and labs.  Questions were answered to the patient's satisfaction.     Ernestene Mention 09/14/2011 11:23 AM

## 2011-09-14 NOTE — Anesthesia Postprocedure Evaluation (Signed)
  Anesthesia Post-op Note  Patient: Jamie Cole  Procedure(s) Performed: Procedure(s) (LRB): PARTIAL MASTECTOMY WITH NEEDLE LOCALIZATION AND AXILLARY SENTINEL LYMPH NODE BX (Right)  Patient Location: PACU  Anesthesia Type: General  Level of Consciousness: awake and alert   Airway and Oxygen Therapy: Patient Spontanous Breathing  Post-op Pain: mild  Post-op Assessment: Post-op Vital signs reviewed, Patient's Cardiovascular Status Stable, Respiratory Function Stable, Patent Airway, No signs of Nausea or vomiting, Adequate PO intake and Pain level controlled  Post-op Vital Signs: stable  Complications: No apparent anesthesia complications

## 2011-09-14 NOTE — Anesthesia Procedure Notes (Signed)
Procedure Name: LMA Insertion Date/Time: 09/14/2011 11:45 AM Performed by: Meyer Russel Pre-anesthesia Checklist: Patient identified, Emergency Drugs available, Suction available, Patient being monitored and Timeout performed Patient Re-evaluated:Patient Re-evaluated prior to inductionOxygen Delivery Method: Circle System Utilized Preoxygenation: Pre-oxygenation with 100% oxygen Intubation Type: IV induction Ventilation: Mask ventilation without difficulty LMA: LMA inserted LMA Size: 4.0 Number of attempts: 1 Airway Equipment and Method: bite block Placement Confirmation: positive ETCO2 and breath sounds checked- equal and bilateral Tube secured with: Tape Dental Injury: Teeth and Oropharynx as per pre-operative assessment

## 2011-09-14 NOTE — Anesthesia Preprocedure Evaluation (Addendum)
Anesthesia Evaluation  Patient identified by MRN, date of birth, ID band Patient awake    Reviewed: Allergy & Precautions, H&P , NPO status , Patient's Chart, lab work & pertinent test results  Airway Mallampati: I TM Distance: >3 FB Neck ROM: Full    Dental   Pulmonary    Pulmonary exam normal       Cardiovascular     Neuro/Psych    GI/Hepatic   Endo/Other  Hypothyroidism   Renal/GU      Musculoskeletal   Abdominal (+) obese,   Peds  Hematology   Anesthesia Other Findings   Reproductive/Obstetrics                           Anesthesia Physical Anesthesia Plan  ASA: II  Anesthesia Plan: General   Post-op Pain Management:    Induction: Intravenous  Airway Management Planned: LMA  Additional Equipment:   Intra-op Plan:   Post-operative Plan: Extubation in OR  Informed Consent: I have reviewed the patients History and Physical, chart, labs and discussed the procedure including the risks, benefits and alternatives for the proposed anesthesia with the patient or authorized representative who has indicated his/her understanding and acceptance.   Dental Advisory Given  Plan Discussed with: CRNA and Surgeon  Anesthesia Plan Comments:        Anesthesia Quick Evaluation

## 2011-09-14 NOTE — Op Note (Signed)
Patient Name:           Jamie Cole   Date of Surgery:        09/14/2011  Pre op Diagnosis:      Invasive ductal carcinoma right breast, upper outer quadrant, clinical stage T1b., N0, ER-positive, HER-2-negative, Ki 67 15%  Post op Diagnosis:    same  Procedure:                 Inject blue dye right breast, right partial mastectomy with needle localization, right axillary sentinel lymph node biopsy  Surgeon:                     [name]. Derrell Lolling, M.D., FACS  Assistant:                      none  Operative Indications:    The patient has not had any breast problems in the past. She is generally healthy. Recent screening mammogram showed a 6 mm mass in the upper outer quadrant of the right breast with faint calcifications extending 1 cm anteriorly.  Image guided biopsy revealed invasive ductal carcinoma which was estrogen receptor positive, HER-2-negative, Ki-67 approximately 15%. She has had an MRI which shows a 9 mm focus in the right breast upper outer quadrant, and this is a completely solitary finding. There was a small cyst in the left lobe of the liver.  Family history is positive for breast cancer in 2 aunts and 5 cousins. She has no sisters. She has one son. She was evaluated in the multidisciplinary clinic, and it was her decision to proceed with breast conservation surgery.   Operative Findings:       The right breast tissue felt normal. The specimen mammogram showed that the marker clip and the wire were contained within the center of the specimen. I found 3 sentinel lymph nodes.  Procedure in Detail:          The patient underwent wire localization at the breast center of [facility] and was sent to [facility]  surgery center. The nuclear medicine technician injected radionuclide into the right breast. The patient was taken to operating room. Gen. Anesthesia was induced. Intravenous antibiotics were given. Surgical time out was performed. Following an alcohol prep I injected 5 cc of  blue dye into the right breast, subareolar area. This was 2 cc of methylene blue mixed with 3 cc of saline. The breast was massaged for 5 minutes.  The right breast and axilla and chest wall were then prepped and draped in a sterile fashion. 0.5% Marcaine with epinephrine was used as a local infiltration anesthetic. I observed the localizing wire entering the very lateral aspect of the right breast at the 9:00 position. A transverse, radially oriented incision was made and dissected down into the breast tissue and around the localizing wire. The specimen was removed and marked with the ink color kit. Specimen mammogram showed that the marker clip and the wire were contained within the center of the specimen. I felt this was adequate. The specimen was sent to pathology. Right breast wound was irrigated with saline. Hemostasis was excellent. I used metal clips to mark the lumpectomy cavity. The tissues were then closed in layers with interrupted sutures of 3-0 Vicryl and the skin closed with a running subcuticular suture of 4-0 Monocryl and Dermabond.  I then made a transverse incision in the right axilla at the hairline. Dissection was carried down through  the subcutaneous tissue. Using the neoprobe guidance directed my dissection and incised the clavipectoral fascia. Isolated 3 very hot, blue lymph nodes. After these 3 sentinel nodes were removed there was no other significant radioactivity. Hemostasis was excellent. The deeper tissues were closed with interrupted sutures of 3-0 Vicryl and the skin closed with running subcuticular suture of 4-0 Monocryl and Dermabond. Dry bandages and a breast binder were placed. The patient tolerated the procedure well and was taken to recovery room stable. EBL 20 cc. Counts correct. Complications none.     [name]. Derrell Lolling, M.D., FACS General and Minimally Invasive Surgery Breast and Colorectal Surgery  09/14/2011 12:57 PM

## 2011-09-14 NOTE — Progress Notes (Signed)
Emotional support during sentinel node injections 

## 2011-09-14 NOTE — Transfer of Care (Signed)
Immediate Anesthesia Transfer of Care Note  Patient: Jamie Cole  Procedure(s) Performed: Procedure(s) (LRB): PARTIAL MASTECTOMY WITH NEEDLE LOCALIZATION AND AXILLARY SENTINEL LYMPH NODE BX (Right)  Patient Location: PACU  Anesthesia Type: General  Level of Consciousness: awake, alert  and oriented  Airway & Oxygen Therapy: Patient Spontanous Breathing and Patient connected to face mask oxygen  Post-op Assessment: Report given to PACU RN and Post -op Vital signs reviewed and stable  Post vital signs: Reviewed and stable  Complications: No apparent anesthesia complications

## 2011-09-15 ENCOUNTER — Telehealth (INDEPENDENT_AMBULATORY_CARE_PROVIDER_SITE_OTHER): Payer: Self-pay | Admitting: General Surgery

## 2011-09-16 NOTE — Progress Notes (Signed)
Quick Note:  Inform patient of Pathology report,. Good margins, negative nodes. ______

## 2011-09-22 ENCOUNTER — Telehealth: Payer: Self-pay | Admitting: *Deleted

## 2011-09-22 NOTE — Telephone Encounter (Signed)
left voice message to inform the patient of the new time and the same date

## 2011-09-22 NOTE — Telephone Encounter (Signed)
Spoke to pt about upcoming appointments.  Confirmed appts with Dr. Donnie Coffin, Dr. Dayton Scrape and Dr. Derrell Lolling.  Encourage pt to call with needs. Received verbal understanding.  Contact information given.

## 2011-09-23 ENCOUNTER — Other Ambulatory Visit: Payer: Self-pay | Admitting: Physician Assistant

## 2011-09-23 DIAGNOSIS — C50419 Malignant neoplasm of upper-outer quadrant of unspecified female breast: Secondary | ICD-10-CM

## 2011-09-26 ENCOUNTER — Ambulatory Visit (HOSPITAL_BASED_OUTPATIENT_CLINIC_OR_DEPARTMENT_OTHER): Payer: BC Managed Care – PPO | Admitting: Oncology

## 2011-09-26 ENCOUNTER — Telehealth: Payer: Self-pay | Admitting: Oncology

## 2011-09-26 ENCOUNTER — Other Ambulatory Visit (HOSPITAL_BASED_OUTPATIENT_CLINIC_OR_DEPARTMENT_OTHER): Payer: BC Managed Care – PPO | Admitting: Lab

## 2011-09-26 VITALS — BP 150/83 | HR 80 | Temp 97.7°F | Ht 64.0 in | Wt 203.7 lb

## 2011-09-26 DIAGNOSIS — C50419 Malignant neoplasm of upper-outer quadrant of unspecified female breast: Secondary | ICD-10-CM

## 2011-09-26 LAB — COMPREHENSIVE METABOLIC PANEL
ALT: 16 U/L (ref 0–35)
AST: 17 U/L (ref 0–37)
Alkaline Phosphatase: 98 U/L (ref 39–117)
CO2: 24 mEq/L (ref 19–32)
Creatinine, Ser: 0.6 mg/dL (ref 0.50–1.10)
Sodium: 141 mEq/L (ref 135–145)
Total Bilirubin: 0.4 mg/dL (ref 0.3–1.2)
Total Protein: 6.3 g/dL (ref 6.0–8.3)

## 2011-09-26 LAB — CBC WITH DIFFERENTIAL/PLATELET
BASO%: 1.7 % (ref 0.0–2.0)
EOS%: 1.4 % (ref 0.0–7.0)
LYMPH%: 30.3 % (ref 14.0–49.7)
MCH: 31.5 pg (ref 25.1–34.0)
MCHC: 33 g/dL (ref 31.5–36.0)
MONO#: 0.5 10*3/uL (ref 0.1–0.9)
NEUT%: 60 % (ref 38.4–76.8)
Platelets: 244 10*3/uL (ref 145–400)
RBC: 4.62 10*6/uL (ref 3.70–5.45)
WBC: 8 10*3/uL (ref 3.9–10.3)
lymph#: 2.4 10*3/uL (ref 0.9–3.3)

## 2011-09-26 LAB — LACTATE DEHYDROGENASE: LDH: 164 U/L (ref 94–250)

## 2011-09-26 MED ORDER — ANASTROZOLE 1 MG PO TABS: 1.0000 mg | ORAL_TABLET | Freq: Every day | ORAL | Status: DC

## 2011-09-26 NOTE — Patient Instructions (Signed)
  You will start taking arimidex 1mg /day after you finish radiation. Expected side effects include joint pains, possible decrease in your bone density. Please continue to take vitamin D as you have been doing. I will see you in 3 months to review how you are doing.

## 2011-09-26 NOTE — Progress Notes (Signed)
Hematology and Oncology Follow Up Visit  Jamie Cole [id number] 11-03-1940 71 y.o. 09/26/2011 4:43 PM PCP  Principle Diagnosis: 71 yo  woman with history of breast cancer  Interim History:  There have been no intercurrent illness, since the ensuing she is undergone lumpectomy and sentinel lymph node evaluation. She showed a 0.9 cm grade 1/3 invasive ductal cancer, sentinel lymph node was negative and the surgical margins were clear.  Medications: I have reviewed the patient's current medications.  Allergies: No Known Allergies  Past Medical History, Surgical history, Social history, and Family History were reviewed and updated.  Review of Systems: Constitutional:  Negative for fever, chills, night sweats, anorexia, weight loss, pain. Cardiovascular: no chest pain or dyspnea on exertion Respiratory: negative Neurological: negative Dermatological: negative ENT: negative Skin Gastrointestinal: negative Genito-Urinary: negative Hematological and Lymphatic: negative Breast: negative Musculoskeletal: negative Remaining ROS negative.  Physical Exam: Blood pressure 150/83, pulse 80, temperature 97.7 F (36.5 C), height 5\' 4"  (1.626 m), weight 203 lb 11.2 oz (92.398 kg). ECOG: 0   Lab Results: Lab Results  Component Value Date   WBC 8.0 09/26/2011   HGB 14.6 09/26/2011   HCT 44.2 09/26/2011   MCV 95.5 09/26/2011   PLT 244 09/26/2011     Chemistry      Component Value Date/Time   NA 141 09/12/2011 1500   K 4.5 09/12/2011 1500   CL 104 09/12/2011 1500   CO2 24 09/12/2011 1500   BUN 14 09/12/2011 1500   CREATININE 0.55 09/12/2011 1500      Component Value Date/Time   CALCIUM 9.6 09/12/2011 1500   ALKPHOS 107 09/12/2011 1500   AST 20 09/12/2011 1500   ALT 15 09/12/2011 1500   BILITOT 0.2* 09/12/2011 1500      .pathology. Radiological Studies: chest X-ray n/a Mammogram n/a Bone density n/a  Impression and Plan: 71 year old woman with history of ER/PR positive grade 1  breast cancer T1bN 0. Discussed her pathology with her and have given her a copy of this. I discussed that adjuvant hormonal therapy would likely be the most efficient way of further reducing her risk of recurrence. She is to see Dr. Dayton Scrape who in turn discuss with your length of therapy. When she was completed therapy I will she will start on Arimidex. I sent a prescription of this to her home pharmacy. I discussed using a vitamin D supplementation and she does have a recent DEXA scan was within past 2 years.   More than 50% of the visit was spent in patient-related counselling   Pierce Crane, MD 2/25/20134:43 PM

## 2011-09-26 NOTE — Telephone Encounter (Signed)
gve the pt her may 2013 appt calendar 

## 2011-09-27 ENCOUNTER — Encounter (INDEPENDENT_AMBULATORY_CARE_PROVIDER_SITE_OTHER): Payer: Self-pay | Admitting: General Surgery

## 2011-09-27 ENCOUNTER — Encounter: Payer: Self-pay | Admitting: Radiation Oncology

## 2011-09-27 ENCOUNTER — Ambulatory Visit (INDEPENDENT_AMBULATORY_CARE_PROVIDER_SITE_OTHER): Payer: BC Managed Care – PPO | Admitting: General Surgery

## 2011-09-27 VITALS — BP 148/100 | HR 84 | Temp 97.4°F | Resp 18 | Ht 64.0 in | Wt 204.4 lb

## 2011-09-27 DIAGNOSIS — C50419 Malignant neoplasm of upper-outer quadrant of unspecified female breast: Secondary | ICD-10-CM

## 2011-09-27 NOTE — Progress Notes (Signed)
Subjective:     Patient ID: Jamie Cole, female   DOB: 10/17/1940, 71 y.o.   MRN: [id number]  HPI This 71 year old woman underwent right partial mastectomy and sentinel lymph node biopsy on September 14, 2011. She is doing well. It just a little bit sore.  The tumor was a 9 mm \ low-grade invasive ductal carcinoma. All 4 sentinel lymph nodes are negative. Margins are greater than 1 cm. Receptor positive. HER-2 negative. Ki-67 18%.  She is scheduled to see Dr. Chipper Cole tomorrow to discuss adjuvant radiation therapy. She has a prescription for arimidex which she is to begin after radiation therapy. She is scheduled to Dr. Donnie Cole in May.  Review of Systems     Objective:   Physical Exam Right breast incision and right axillary incisions look good. Minimal bruising. No infection. No hematoma. Range of motion right shoulder is pretty good. No arm swelling. No sensory deficit of the arm.    Assessment:     Invasive ductal carcinoma right breast, pathologic stage T1b., N0, receptor positive, HER-2-negative, Ki-67 18%.  Doing well in the early postop period following right partial mastectomy and sentinel node biopsy.    Plan:     ABC class referral. Range of motion exercise  Given to the patient. Wound care discussed.  Keep appointment with Dr. Chipper Cole tomorrow. Okay to proceed with adjuvant radiation therapy  Return to see me in 6 weeks.   Jamie Cole. Jamie Cole, M.D., [facility] General and Minimally invasive Surgery Breast and Colorectal Surgery Office:   [phone] Pager:   [phone]

## 2011-09-27 NOTE — Patient Instructions (Signed)
Your right breast and right axillary wounds are healing uneventfully paid to have a copy of her pathology report.  Keep your palm with Dr. Dayton Scrape tomorrow. He will schedule your radiation therapy treatments.  You have been given a referral to the ABC class and you have been given an exercise sheet for your right shoulder. Proceed with range of motion exercises.  Return to see Dr. Derrell Lolling in 6 weeks.

## 2011-09-27 NOTE — Progress Notes (Signed)
71 year old female.  Right partial mastectomy with needle localization, right axillary sentinel lymph node biopsy done 09/14/2011.  Invasive ductal carcinoma right breast, upper outer quadrant, clinical stage T1b, N0 , ER-positive, HER-2 negative. 0.9 cm sentinel lymph node was negative and surgical margins were clear. Dr. Donnie Coffin plans adjuvant hormonal therapy .   NKDA  No indication of a pacemaker No hx of radiation thearpy

## 2011-09-28 ENCOUNTER — Ambulatory Visit
Admission: RE | Admit: 2011-09-28 | Discharge: 2011-09-28 | Disposition: A | Discharge status: Home / Self Care | Payer: Medicare Other | Source: Ambulatory Visit | Attending: Radiation Oncology | Admitting: Radiation Oncology

## 2011-09-28 ENCOUNTER — Encounter: Payer: Self-pay | Admitting: Radiation Oncology

## 2011-09-28 VITALS — BP 145/86 | HR 83 | Temp 97.0°F | Resp 18 | Ht 64.0 in | Wt 203.6 lb

## 2011-09-28 DIAGNOSIS — C50419 Malignant neoplasm of upper-outer quadrant of unspecified female breast: Secondary | ICD-10-CM

## 2011-09-28 DIAGNOSIS — C50919 Malignant neoplasm of unspecified site of unspecified female breast: Secondary | ICD-10-CM | POA: Diagnosis not present

## 2011-09-28 DIAGNOSIS — Z510 Encounter for antineoplastic radiation therapy: Secondary | ICD-10-CM | POA: Insufficient documentation

## 2011-09-28 NOTE — Progress Notes (Signed)
Patient presents to the clinic today accompanied by her daughter and husband for a new consult with Dr. Dayton Scrape reference breast cancer. Patient is alert and oriented to person, place, and time. No distress noted. Steady gait noted. Pleasant affect noted. Patient denies pain at this time. Patient reports her breast continues to be sore. Patient denies nausea, vomiting, headache or dizziness. Patient and family had numerous question and all were answered. Reported all findings to Dr. Dayton Scrape.

## 2011-09-28 NOTE — Progress Notes (Signed)
Please see the Nurse Progress Note in the MD Initial Consult Encounter for this patient. 

## 2011-09-28 NOTE — Progress Notes (Addendum)
Followup note:  Diagnosis pathologic stage I (T1, N0, M0) invasive ductal/DCIS of the right breast  Ms. Sauber is seen today for review and consideration of radiation therapy to her right breast in the management of her stage I (T1 N0) invasive ductal/DCIS of the right breast. I saw the patient in consultation on 09/07/2011. She presented with a 0.6 cm irregular mass within the upper-outer quadrant of the right breast with associated calcifications on mammography. Breast MRI on 09/06/2011 showed a 0.9 cm solitary enhancing focus within the upper-outer quadrant of the right breast. Biopsy on 08/31/2011 was diagnostic for invasive ductal carcinoma with associated calcifications and DCIS. The tumor was strongly ER/PR positive at 100% with a borderline proliferation marker Ki-67 of 19%. On 09/14/2011 she underwent a right partial mastectomy and sentinel lymph node biopsy. She is found to have a 0.9 cm invasive ductal carcinoma along with high grade DCIS (grade 3 of 3) with surgical margins for the invasive disease and DCIS greater than 1 cm. 4 sentinel lymph nodes were free of metastatic disease. The patient was seen by Dr. Donnie Coffin who recommended adjuvant hormone therapy with Arimidex.  Physical examination: Alert and oriented. Vital signs stable. Nodes: Without palpable cervical, supraclavicular, or axillary lymphadenopathy. Chest: Lungs clear. Back: Without spinal or CVA tenderness. Heart: Regular rate and rhythm. Breasts: There is a partial mastectomy wound along the upper-outer quadrant of the right breast in addition to a lower axillary wound, both well healed. No masses are appreciated. Abdomen without hepatomegaly. Extremities without edema.  Impression: Pathologic stage I (T1, N0, M0) invasive ductal/DCIS of the right breast. As discussed previously, her local treatment options include mastectomy versus partial mastectomy with or without radiation therapy and consideration of adjuvant hormone therapy. I  do not feel that she needs to have a repeat mammogram at this time to confirm removal of all suspicious microcalcifications with her white surgical margins. She is probably at higher risk for recurrence of her DCIS since it is high grade with comedonecrosis. The risk for local failure of DCIS alone without adjuvant hormone therapy may be as high as 30%, which could be decreased to 15% with adjuvant hormone therapy. Considering her excellent health and lack of medical comorbidities, I would certainly offer her hypo-fractionated radiation therapy. I discussed the potential acute and late toxicities of radiation therapy and she very much would like to proceed with radiation therapy followed by adjuvant hormone therapy. Consent was signed today. I'll have her return the week of March 11 for treatment planning.  45 minutes was spent face-to-face with the patient, primarily counseling the patient and coordinating her care.

## 2011-09-28 NOTE — Progress Notes (Signed)
Complete PATIENT MEASURE OF DISTRESS worksheet with a score of 3 submitted to social work. Also, completed NUTRITION RISK SCREEN worksheet without concern submitted to Zenovia Jarred, RD.

## 2011-09-29 ENCOUNTER — Telehealth: Payer: Self-pay | Admitting: Radiation Oncology

## 2011-09-29 NOTE — Telephone Encounter (Signed)
Returning patient's call from this morning. Patient phoned to request that Dr. Dayton Scrape call her at home, number [phone]. Patient states,"I got lost in the conversation yesterday and need some clarification." Also, patient states,"I lost it when he reference high grade 3 and now I am unsure as to why he doesn't think I need a repeat mammogram." Read Dr. Rennie Plowman office note to the patient in an attempt to clarify yesterday's conversation but, patient would prefer Dr. Dayton Scrape phone her. Assured patient that Dr. Dayton Scrape would call her later today. Routed this message to Dr. Dayton Scrape.

## 2011-09-29 NOTE — Progress Notes (Signed)
Encounter addended by: Ardell Isaacs, RN on: 09/29/2011 11:55 AM<BR>     Documentation filed: Charges VN, Inpatient Patient Education

## 2011-09-30 ENCOUNTER — Telehealth: Payer: Self-pay | Admitting: Radiation Oncology

## 2011-09-30 NOTE — Telephone Encounter (Signed)
Patient phoned today requesting Dr. Dayton Scrape call her at his earliest convenience. Patient is questioning if she should "remove her whole breast." Instructed patient this writer would relay this message and request he call her at [phone].

## 2011-10-04 ENCOUNTER — Encounter (HOSPITAL_BASED_OUTPATIENT_CLINIC_OR_DEPARTMENT_OTHER): Payer: Self-pay | Admitting: *Deleted

## 2011-10-04 ENCOUNTER — Emergency Department (HOSPITAL_BASED_OUTPATIENT_CLINIC_OR_DEPARTMENT_OTHER)
Admission: EM | Admit: 2011-10-04 | Discharge: 2011-10-04 | Disposition: A | Discharge status: Home / Self Care | Payer: Medicare Other | Attending: Emergency Medicine | Admitting: Emergency Medicine

## 2011-10-04 DIAGNOSIS — Z85828 Personal history of other malignant neoplasm of skin: Secondary | ICD-10-CM | POA: Diagnosis not present

## 2011-10-04 DIAGNOSIS — C50919 Malignant neoplasm of unspecified site of unspecified female breast: Secondary | ICD-10-CM | POA: Insufficient documentation

## 2011-10-04 DIAGNOSIS — Z9010 Acquired absence of unspecified breast and nipple: Secondary | ICD-10-CM | POA: Insufficient documentation

## 2011-10-04 DIAGNOSIS — K921 Melena: Secondary | ICD-10-CM | POA: Diagnosis not present

## 2011-10-04 DIAGNOSIS — Z79899 Other long term (current) drug therapy: Secondary | ICD-10-CM | POA: Diagnosis not present

## 2011-10-04 DIAGNOSIS — K625 Hemorrhage of anus and rectum: Secondary | ICD-10-CM

## 2011-10-04 LAB — DIFFERENTIAL
Basophils Absolute: 0.1 10*3/uL (ref 0.0–0.1)
Basophils Relative: 1 % (ref 0–1)
Eosinophils Absolute: 0.2 10*3/uL (ref 0.0–0.7)
Eosinophils Relative: 4 % (ref 0–5)
Monocytes Absolute: 0.8 10*3/uL (ref 0.1–1.0)
Monocytes Relative: 12 % (ref 3–12)

## 2011-10-04 LAB — COMPREHENSIVE METABOLIC PANEL
AST: 14 U/L (ref 0–37)
Albumin: 3.5 g/dL (ref 3.5–5.2)
BUN: 21 mg/dL (ref 6–23)
Calcium: 9.4 mg/dL (ref 8.4–10.5)
Chloride: 105 mEq/L (ref 96–112)
Creatinine, Ser: 0.6 mg/dL (ref 0.50–1.10)
Total Bilirubin: 0.1 mg/dL — ABNORMAL LOW (ref 0.3–1.2)

## 2011-10-04 LAB — CBC
HCT: 40.2 % (ref 36.0–46.0)
Hemoglobin: 13.9 g/dL (ref 12.0–15.0)
MCH: 31.9 pg (ref 26.0–34.0)
MCHC: 34.6 g/dL (ref 30.0–36.0)
MCV: 92.2 fL (ref 78.0–100.0)
RDW: 12.5 % (ref 11.5–15.5)

## 2011-10-04 LAB — OCCULT BLOOD X 1 CARD TO LAB, STOOL: Fecal Occult Bld: POSITIVE

## 2011-10-04 NOTE — ED Notes (Signed)
Pt c/o bloody loose stool x 1

## 2011-10-04 NOTE — ED Provider Notes (Signed)
History     CSN: [id number]  Arrival date & time 10/04/11  2102   First MD Initiated Contact with Patient 10/04/11 2135      Chief Complaint  Patient presents with  . Rectal Bleeding    (Consider location/radiation/quality/duration/timing/severity/associated sxs/prior treatment) HPI Comments: Went to bathroom to have a bowel movement.  She looked down and saw blood.  There was no pain and she has not been constipated.  Last colonoscopy was 10 years ago and was okay.    Patient is a 71 y.o. female presenting with hematochezia. The history is provided by the patient.  Rectal Bleeding  The current episode started today. Episode frequency: once. The problem has been resolved. The patient is experiencing no pain. The stool is described as soft. There was no prior successful therapy. There was no prior unsuccessful therapy.    Past Medical History  Diagnosis Date  . Endometrial hyperplasia, simple   . Endometrial polyp   . Wears glasses   . Hypothyroidism   . Complication of anesthesia     hard to wake up  . Skin cancer     no additional information known  . Breast cancer     DCIS  . Breast CA     Past Surgical History  Procedure Date  . Endometrial polyp   . Tonsillectomy   . Colonoscopy   . Mastectomy, partial 09/14/11    right breast  . Breast biopsy   . Breast lumpectomy     Family History  Problem Relation Age of Onset  . Cancer Father 33    "skin cancer"  . Cancer Paternal Aunt     paternal aunts x 2 with breast ca; approx 70 at dx  . Cancer Paternal Grandmother 25    gastric ca  . Cancer Cousin     5 paternal cousins with breast ca; ages 45-60 at dx  . Cancer Cousin     maternal cousins with prostate & tonsillar ca  . Cancer Maternal Uncle     brain    History  Substance Use Topics  . Smoking status: Never Smoker   . Smokeless tobacco: Not on file  . Alcohol Use: No    OB History    Grav Para Term Preterm Abortions TAB SAB Ect Mult Living             Review of Systems  Gastrointestinal: Positive for hematochezia.  All other systems reviewed and are negative.    Allergies  Review of patient's allergies indicates no known allergies.  Home Medications   Current Outpatient Rx  Name Route Sig Dispense Refill  . VITAMIN C 1000 MG PO TABS Oral Take 1,000 mg by mouth daily.    . ASPIRIN EC 81 MG PO TBEC Oral Take 81 mg by mouth daily.    Marland Kitchen GRAPE SEED PO Oral Take by mouth daily.    Marland Kitchen CALCIUM CITRATE-VITAMIN D 315-200 MG-UNIT PO TABS Oral Take 1 tablet by mouth 2 (two) times daily.    . OMEGA-3 FATTY ACIDS 1000 MG PO CAPS Oral Take 1 g by mouth daily.    Marland Kitchen FLAX SEEDS PO Oral Take 1,000 mg by mouth daily.    Marland Kitchen GLUCOSAMINE-CHONDROITIN 500-400 MG PO TABS Oral Take 1 tablet by mouth 3 (three) times daily.    Marland Kitchen LEVOTHYROXINE SODIUM 112 MCG PO TABS Oral Take 112 mcg by mouth daily.    Marland Kitchen CITRUCEL PO Oral Take 4 capsules by mouth daily.    Marland Kitchen  RESVERATROL PO Oral Take 500 mg by mouth daily.    . ANASTROZOLE 1 MG PO TABS Oral Take 1 tablet (1 mg total) by mouth daily. 30 tablet 11  . MAGNESIUM-POTASSIUM PO Oral Take 99 mg by mouth daily.      BP 151/78  Pulse 92  Temp(Src) 98.5 F (36.9 C) (Oral)  Resp 16  Ht 5\' 4"  (1.626 m)  Wt 203 lb (92.08 kg)  BMI 34.84 kg/m2  SpO2 100%  Physical Exam  Nursing note and vitals reviewed. Constitutional: She is oriented to person, place, and time. She appears well-developed and well-nourished. No distress.  HENT:  Head: Normocephalic and atraumatic.  Neck: Normal range of motion. Neck supple.  Cardiovascular: Normal rate and regular rhythm.  Exam reveals no gallop and no friction rub.   No murmur heard. Pulmonary/Chest: Effort normal and breath sounds normal. No respiratory distress. She has no wheezes.  Abdominal: Soft. Bowel sounds are normal. She exhibits no distension. There is no tenderness.  Genitourinary:       Rectal exam normal.  Traces of red, suspect this is likely blood.    Musculoskeletal: Normal range of motion.  Neurological: She is alert and oriented to person, place, and time.  Skin: Skin is warm and dry. She is not diaphoretic.    ED Course  Procedures (including critical care time)   Labs Reviewed  CBC  DIFFERENTIAL  COMPREHENSIVE METABOLIC PANEL   No results found.   No diagnosis found.    MDM  Labs look okay, no further bleeding.  She needs to follow up with her pcp tomorrow for GI referral.  She has upcoming radiation treatments for breast cancer and needs to discuss whether colonoscopy should come before or after radiation.  If worsens, to go to [facility] or [facility].        Geoffery Lyons, MD 10/04/11 2300

## 2011-10-04 NOTE — Discharge Instructions (Signed)
Rectal Bleeding  Rectal bleeding is when blood passes out of the anus. It is usually a sign that something is wrong. It may not be serious, but it should always be evaluated. Rectal bleeding may present as bright red blood or extremely dark stools. The color may range from dark red or maroon to black (like tar). It is important that the cause of rectal bleeding be identified so treatment can be started and the problem corrected.  CAUSES    Hemorrhoids. These are enlarged (dilated) blood vessels or veins in the anal or rectal area.   Fistulas. Theseare abnormal, burrowing channels that usually run from inside the rectum to the skin around the anus. They can bleed.   Anal fissures. This is a tear in the tissue of the anus. Bleeding occurs with bowel movements.   Diverticulosis. This is a condition in which pockets or sacs project from the bowel wall. Occasionally, the sacs can bleed.   Diverticulitis. Thisis an infection involving diverticulosis of the colon.   Proctitis and colitis. These are conditions in which the rectum, colon, or both, can become inflamed and pitted (ulcerated).   Polyps and cancer. Polyps are non-cancerous (benign) growths in the colon that may bleed. Certain types of polyps turn into cancer.   Protrusion of the rectum. Part of the rectum can project from the anus and bleed.   Certain medicines.   Intestinal infections.   Blood vessel abnormalities.  HOME CARE INSTRUCTIONS   Eat a high-fiber diet to keep your stool soft.   Limit activity.   Drink enough fluids to keep your urine clear or pale yellow.   Warm baths may be useful to soothe rectal pain.   Follow up with your caregiver as directed.  SEEK IMMEDIATE MEDICAL CARE IF:   You develop increased bleeding.   You have black or dark red stools.   You vomit blood or material that looks like coffee grounds.   You have abdominal pain or tenderness.   You have a fever.   You feel weak, nauseous, or you faint.   You have  severe rectal pain or you are unable to have a bowel movement.  MAKE SURE YOU:   Understand these instructions.   Will watch your condition.   Will get help right away if you are not doing well or get worse.  Document Released: 01/07/2002 Document Revised: 07/07/2011 Document Reviewed: 01/02/2011  ExitCare Patient Information 2012 [employer].

## 2011-10-05 DIAGNOSIS — E559 Vitamin D deficiency, unspecified: Secondary | ICD-10-CM | POA: Diagnosis not present

## 2011-10-06 ENCOUNTER — Encounter: Payer: Self-pay | Admitting: *Deleted

## 2011-10-06 ENCOUNTER — Telehealth: Payer: Self-pay | Admitting: *Deleted

## 2011-10-06 ENCOUNTER — Encounter: Payer: Self-pay | Admitting: Physician Assistant

## 2011-10-06 ENCOUNTER — Ambulatory Visit (INDEPENDENT_AMBULATORY_CARE_PROVIDER_SITE_OTHER): Payer: Medicare Other | Admitting: Physician Assistant

## 2011-10-06 ENCOUNTER — Telehealth: Payer: Self-pay | Admitting: Internal Medicine

## 2011-10-06 VITALS — BP 136/76 | HR 80 | Ht 64.0 in | Wt 203.4 lb

## 2011-10-06 DIAGNOSIS — K625 Hemorrhage of anus and rectum: Secondary | ICD-10-CM | POA: Diagnosis not present

## 2011-10-06 MED ORDER — PEG-KCL-NACL-NASULF-NA ASC-C 100 G PO SOLR: 1.0000 | Freq: Once | ORAL | Status: AC

## 2011-10-06 MED ORDER — PRAMOXINE-HC 1-2.5 % EX CREA: TOPICAL_CREAM | CUTANEOUS | Status: DC

## 2011-10-06 NOTE — Progress Notes (Signed)
[facility] came to the [facility] to inform me that she went to the ER on 10/04/11 for rectal bleeding.  The pt relayed they suggested she have a colonoscopy and if we could order the test.  Per Dr. Donnie Coffin pt needs to see GI physician.  The pt was referred to Dr. Dickie La for evaluation.  The pt saw Dr. Marian Sorrow PA today.  The pt is scheduled for colonoscopy on 3/11.  Her sim was moved to 3/12.

## 2011-10-06 NOTE — Patient Instructions (Signed)
We have scheduled the colonoscopy with Dr. Lina Sar at [facility], 1st floor. Directions provided. We sent a prescription for the Moviprep ( colonoscopy prep) to [facility] [place], [place]. We also sent a prescription for the Analpram rectal cream.

## 2011-10-06 NOTE — Progress Notes (Signed)
I have reviewed this note and agree that  source of bleeding is likely anorectal but she is due for colon screening.

## 2011-10-06 NOTE — Progress Notes (Signed)
Subjective:    Patient ID: Jamie Cole, female    DOB: March 22, 1941, 71 y.o.   MRN: [id number]  HPI Jamie Cole is a pleasant 71 year old white female known to Dr. Lina Cole  from remote colonoscopy which was done in December of 2002. This was a negative exam with finding of a redundant colon. Patient has recently been diagnosed with breast cancer and underwent a right mastectomy about  3 weeks ago. She is scheduled to start radiation treatments on March 18.  Patient comes in today after acute onset of rectal bleeding 2 days ago. She said prior to that time she had been feeling well and had not been having any GI issues. She said she had an urge for bowel movement,then  passed a stool which was coated with blood. The next couple of bowel movements seem to contain less blood. Yesterday she said she had 2 bowel movements , still containing red blood but less amount and today had a brown stool which did not have any obvious blood. She is not having any any rectal pain or pressure, has no complaints of abdominal pain cramping etc. She is not on any regular blood thinners- had been taking a baby aspirin which she has stopped. She did go to the emergency room on [address] on March 5 and they advised her to come here for evaluation. Her hemoglobin at that time was 13.9 hematocrit 40.2 and MCV of 92    Review of Systems  Constitutional: Negative.   HENT: Negative.   Eyes: Negative.   Respiratory: Negative.   Cardiovascular: Negative.   Gastrointestinal: Positive for anal bleeding.  Genitourinary: Negative.   Musculoskeletal: Negative.   Neurological: Negative.   Hematological: Negative.   Psychiatric/Behavioral: Negative.    Outpatient Prescriptions Prior to Visit  Medication Sig Dispense Refill  . levothyroxine (SYNTHROID, LEVOTHROID) 112 MCG tablet Take 112 mcg by mouth daily.      Marland Kitchen anastrozole (ARIMIDEX) 1 MG tablet Take 1 tablet (1 mg total) by mouth daily.  30 tablet  11  . Ascorbic Acid  (VITAMIN C) 1000 MG tablet Take 1,000 mg by mouth daily.      Marland Kitchen aspirin EC 81 MG tablet Take 81 mg by mouth daily.      Marland Kitchen Bioflavonoid Products (GRAPE SEED PO) Take by mouth daily.      . calcium citrate-vitamin D (CITRACAL+D) 315-200 MG-UNIT per tablet Take 1 tablet by mouth 2 (two) times daily.      . fish oil-omega-3 fatty acids 1000 MG capsule Take 1 g by mouth daily.      . Flaxseed, Linseed, (FLAX SEEDS PO) Take 1,000 mg by mouth daily.      Marland Kitchen glucosamine-chondroitin 500-400 MG tablet Take 1 tablet by mouth 3 (three) times daily.      Marland Kitchen MAGNESIUM-POTASSIUM PO Take 99 mg by mouth daily.      . Methylcellulose, Laxative, (CITRUCEL PO) Take 4 capsules by mouth daily.      Marland Kitchen RESVERATROL PO Take 500 mg by mouth daily.       No Known Allergies Patient Active Problem List  Diagnoses  . Cancer of upper-outer quadrant of female breast  . Hypothyroid       Objective:   Physical Exam well-developed white female in no acute distress, pleasant, accompanied by her husband. Blood pressure 136/76 pulse 80. HEENT; nontraumatic normocephalic EOMI PERRLA sclera anicteric,Neck; Supple no JVD, Cardiovascular; regular rate and rhythm with S1-S2 no murmur gallop, Pulmonary; clear bilaterally, Abdomen; soft  nontender nondistended bowel sounds are active no palpable mass or hepatosplenomegaly, Rectal; exam she does have an inflamed external hemorrhoid which is nontender and nonthrombosed, stool is brown and trace Hemoccult positive, Extremities; no clubbing, cyanosis, or edema skin warm dry, Psych; mood, and affect normal and appropriate        Assessment & Plan:  #29 71 year old female with new onset of painless rectal bleeding with initial episode 2 days ago. I suspect her bleeding may be secondary to the external hemorrhoid, however her last colonoscopy was a remote and cannot rule out occult colon lesion or low-grade diverticular bleeding it has resolved. #2 new diagnosis of breast cancer status post  mastectomy 3 weeks ago and anticipating initiation of radiation therapy on 10/17/2011.  Plan; Analpram cream 2.5% 2-3 times daily over the next week and then as needed Schedule for colonoscopy with Dr. Juanda Cole on Monday, March 11 procedure discussed in detail with the patient and she is agreeable to proceed and would very much appreciate having the colonoscopy completed prior to starting her radiation. Patient had been scheduled for a simulation appointment on March 11 and we have discussed this with the breast cancer navigator and her appointment was moved to Tuesday, March 12.

## 2011-10-06 NOTE — Telephone Encounter (Signed)
Spoke with Lawson Fiscal and scheduled patient with Mike Gip, PA today at 2:30 PM.

## 2011-10-06 NOTE — Telephone Encounter (Signed)
left voice message informing the patient of the new date and time of the appointment with dr.brodie's pa on 10-06-2011 at 2:15pm arrival time

## 2011-10-07 ENCOUNTER — Telehealth: Payer: Self-pay | Admitting: *Deleted

## 2011-10-07 ENCOUNTER — Telehealth: Payer: Self-pay | Admitting: Physician Assistant

## 2011-10-07 DIAGNOSIS — K625 Hemorrhage of anus and rectum: Secondary | ICD-10-CM

## 2011-10-07 NOTE — Telephone Encounter (Signed)
Called patient and left information on her answering machine that Dr. Juanda Chance had a cancellation in our Endoscopy Unit 4th floor for Tues 10-11-2011 . We wanted to change her appointment for the colonoscopy for that date.  I asked that she please call me today at [phone] and ask for Alton Bouknight.

## 2011-10-10 ENCOUNTER — Ambulatory Visit
Admission: RE | Admit: 2011-10-10 | Discharge: 2011-10-10 | Disposition: A | Discharge status: Home / Self Care | Payer: Medicare Other | Source: Ambulatory Visit | Attending: Radiation Oncology | Admitting: Radiation Oncology

## 2011-10-10 ENCOUNTER — Ambulatory Visit: Payer: Medicare Other | Admitting: Radiation Oncology

## 2011-10-10 ENCOUNTER — Encounter (HOSPITAL_COMMUNITY): Admission: RE | Payer: Self-pay | Source: Ambulatory Visit

## 2011-10-10 ENCOUNTER — Ambulatory Visit (INDEPENDENT_AMBULATORY_CARE_PROVIDER_SITE_OTHER): Payer: Medicare Other | Admitting: General Surgery

## 2011-10-10 ENCOUNTER — Ambulatory Visit (HOSPITAL_COMMUNITY): Admission: RE | Admit: 2011-10-10 | Payer: Medicare Other | Source: Ambulatory Visit | Admitting: Internal Medicine

## 2011-10-10 ENCOUNTER — Encounter (INDEPENDENT_AMBULATORY_CARE_PROVIDER_SITE_OTHER): Payer: Self-pay | Admitting: General Surgery

## 2011-10-10 ENCOUNTER — Other Ambulatory Visit: Payer: Self-pay | Admitting: Radiation Oncology

## 2011-10-10 ENCOUNTER — Encounter: Payer: Self-pay | Admitting: *Deleted

## 2011-10-10 ENCOUNTER — Telehealth: Payer: Self-pay | Admitting: Physician Assistant

## 2011-10-10 VITALS — BP 170/94 | HR 80 | Temp 97.7°F | Resp 18 | Ht 64.0 in | Wt 198.4 lb

## 2011-10-10 DIAGNOSIS — C50419 Malignant neoplasm of upper-outer quadrant of unspecified female breast: Secondary | ICD-10-CM | POA: Diagnosis not present

## 2011-10-10 DIAGNOSIS — C50919 Malignant neoplasm of unspecified site of unspecified female breast: Secondary | ICD-10-CM | POA: Diagnosis not present

## 2011-10-10 DIAGNOSIS — Z510 Encounter for antineoplastic radiation therapy: Secondary | ICD-10-CM | POA: Diagnosis not present

## 2011-10-10 SURGERY — COLONOSCOPY
Anesthesia: Moderate Sedation

## 2011-10-10 NOTE — Telephone Encounter (Signed)
Changed the patients Colon appt with Dr. Juanda Chance to 10-11-2011.  [facility] and [facility] the patient's appt for the Simulation for her breast cancer radiology to Monday 10-10-2011.  I gave the patient the times for both of these appointments.  She was fine with the changes of the appointments. I faxed the patient the appropriate paperwork to sign.  She signed it and faxed it back to me and I gave it to Eugene Garnet, Friday 10-07-2011.

## 2011-10-10 NOTE — Patient Instructions (Signed)
You may have a low grade infection in your right breast. I do not think there is any abscess. You have been given a prescription for doxycycline, and I want you to start taking those medications today.  Return to see me at your regularly scheduled appointment, which is April 11.  Return to see me sooner if any new problems arise.

## 2011-10-10 NOTE — Progress Notes (Signed)
Simulation/treatment planning note:  The patient was taken to the CT simulator for simulation/treatment planning in the management of her stage I (T1, N0, M0) invasive ductal/DCIS of the right breast. She is without complaints today, that she is having more erythema along the central right breast. She was placed supine in a custom neck mold/breast board was used for immobilization. A right partial mastectomy scar was marked with a radiopaque wire. I define the borders of her tangential fields, clinically. She was then scanned. I chose and isocenter along the central breast. She was set up to medial and lateral tangential fields and two separate and unique multileaf collimators were designed. I initially prescribed 4500 cGy in 17 sessions (hypo-fractionation), but the patient expresses anxiety about having a short course of radiation therapy compared to conventional radiation therapy. She insists that it would be not be a problem for her to come for 5 weeks instead of 3 one half weeks. I reassured her that hypo-fractionated radiation therapy would certainly be well except it considering her excellent prognosis and age, she still like to have standard fractionation. Therefore I prescribing 5000 cGy in 25 sessions (no boost). An isodose plan and dosimetry are requested.  I told the patient that I would like for Dr. Derrell Lolling one of his associates to take a look at her right breast which is certainly more erythematous centrally and previously noted. I defer as to whether or not an antibiotic should be prescribed. Conceivably, this could represent a delayed hypersensitivity to her sentinel lymph node tracer. She is not scheduled to begin her treatment until next week.

## 2011-10-10 NOTE — Progress Notes (Signed)
Met with patient to discuss RO billing.  Rad Tx: [id number] Extrl Beam  Dx: 174.4 Upper-outer quadrant of breast   Attending Rad: Dr. Dayton Scrape

## 2011-10-10 NOTE — Progress Notes (Signed)
Subjective:     Patient ID: Jamie Cole, female   DOB: 05-06-1941, 71 y.o.   MRN: [id number]  HPI This patient is being evaluated as a work in today because of suspected cellulitis of the right breast.  On June 13, 2012 she underwent right partial mastectomy and sentinel node biopsy. She had a 0.9 cm invasive duct carcinoma, pathologic stage T1b., N0, receptor positive, HER-2/neu negative, with widely negative margins.  She went for stimulation today and they thought her right breast looked a little bit red. She was referred for my evaluation.  She also had some painless hematochezia recently and is going to a bowel prep today and is going to have a colonoscopy by Dr. Lina Sar tomorrow.  She denies fever or chills or significant pain in the right breast. She denies right arm swelling. Review of Systems     Objective:   Physical Exam Patient is alert and in no distress. Temp 97.7. Pulse 80.  Breasts: Right breast shows right axillary incision and right lateral partial mastectomy incision. There is no drainage. There is no mass. There is no abscess. There is faint erythema throughout the breast. There is no induration.  There is no arm swelling on the right. Range of motion is normal.    Assessment:     Possible low-grade cellulitis right breast, postop partial mastectomy and sentinel biopsy.  Invasive ductal carcinoma right breast, pathologic stage T1b., N0, receptor positive, HER-2-negative. Status post recent partial mastectomy and sentinel node biopsy.    Plan:     Doxycycline 100 mg p.o. b.i.d. for 8 days.  Prescription for right upper extremity lymphedema sleeve given to patient, as this was requested as a prophylactic device by the physical therapist.  Colonoscopy will be performed tomorrow because a recent onset rectal bleeding.  Return to see me April 11.   [name]. Derrell Lolling, M.D., [facility] General and Minimally invasive  Surgery Breast and Colorectal Surgery Office:   [phone] Pager:   [phone]

## 2011-10-10 NOTE — Progress Notes (Signed)
Pt came to office after being sim'd with Dr. Dayton Scrape.  Pt relate that entire right breast was pink and Dr. Dayton Scrape informed pt she needed to have Dr. Derrell Lolling assess right breast.  I asked pt if it was tender, draining, hot to touch or itching.  Pt said "No" to all.  Pt stated she didn't notice it was pink until today while she was undressing for simulation.  I spoke with nursing triage at [facility].  Pt will see Dr. Derrell Lolling today at 1300.  Gave pt date and time.

## 2011-10-10 NOTE — Telephone Encounter (Signed)
Told pt it was ok to take Doxycycline

## 2011-10-11 ENCOUNTER — Ambulatory Visit (AMBULATORY_SURGERY_CENTER): Payer: Medicare Other | Admitting: Internal Medicine

## 2011-10-11 ENCOUNTER — Encounter: Payer: Self-pay | Admitting: Internal Medicine

## 2011-10-11 ENCOUNTER — Ambulatory Visit: Payer: Medicare Other | Admitting: Radiation Oncology

## 2011-10-11 DIAGNOSIS — K625 Hemorrhage of anus and rectum: Secondary | ICD-10-CM

## 2011-10-11 DIAGNOSIS — K648 Other hemorrhoids: Secondary | ICD-10-CM

## 2011-10-11 DIAGNOSIS — K635 Polyp of colon: Secondary | ICD-10-CM

## 2011-10-11 DIAGNOSIS — E079 Disorder of thyroid, unspecified: Secondary | ICD-10-CM | POA: Diagnosis not present

## 2011-10-11 DIAGNOSIS — D126 Benign neoplasm of colon, unspecified: Secondary | ICD-10-CM

## 2011-10-11 DIAGNOSIS — R195 Other fecal abnormalities: Secondary | ICD-10-CM | POA: Diagnosis not present

## 2011-10-11 MED ORDER — SODIUM CHLORIDE 0.9 % IV SOLN: 500.0000 mL | INTRAVENOUS | Status: DC

## 2011-10-11 MED ORDER — HYDROCORTISONE ACETATE 25 MG RE SUPP: 25.0000 mg | Freq: Two times a day (BID) | RECTAL | Status: DC

## 2011-10-11 NOTE — Patient Instructions (Addendum)

## 2011-10-11 NOTE — Op Note (Signed)
 [facility] [address] [place], [place]  [address]  COLONOSCOPY PROCEDURE REPORT  PATIENT:  Jamie Cole, Jamie Cole  MR#:  [id number] BIRTHDATE:  22-Dec-1940, 70 yrs. old  GENDER:  female ENDOSCOPIST:  Hedwig Morton. Juanda Chance, MD REF. BY:  Guerry Bruin, M.D. PROCEDURE DATE:  10/11/2011 PROCEDURE:  Colonoscopy with biopsy and snare polypectomy ASA CLASS:  Class II INDICATIONS:  hematochezia last colon 2002 MEDICATIONS:   MAC sedation, administered by CRNA, propofol (Diprivan) 300 mcg  DESCRIPTION OF PROCEDURE:   After the risks and benefits and of the procedure were explained, informed consent was obtained. Digital rectal exam was performed and revealed no rectal masses. The [id number] endoscope was introduced through the anus and advanced to the cecum, which was identified by both the appendix and ileocecal valve.  The quality of the prep was good, using MoviPrep.  The instrument was then slowly withdrawn as the colon was fully examined. <<PROCEDUREIMAGES>>  FINDINGS:  Three polyps were found in the sigmoid colon. at 20 cm 4mm polyp x 3 The polyps were removed using cold biopsy forceps (see image5, image4, and image6).  Internal Hemorrhoids were found (see image7, image8, and image9).  This was otherwise a normal examination of the colon (see image3, image2, and image1). Retroflexed views in the rectum revealed no abnormalities.    The scope was then withdrawn from the patient and the procedure completed.  COMPLICATIONS:  None ENDOSCOPIC IMPRESSION: 1) Three polyps in the sigmoid colon 2) Internal hemorrhoids 3) Otherwise normal examination henatochezia was likely caused by internal hems,first grade, there were no diverticuli RECOMMENDATIONS: 1) Await pathology results 2) High fiber diet. Anusol HC supp 1 hs  REPEAT EXAM:  In 7 - 10 year(s) for.  ______________________________ Hedwig Morton. Juanda Chance, MD  CC:  n. eSIGNED:   Hedwig Morton. Sergio Zawislak at 10/11/2011 04:00  PM  Winifred Olive, [id number]

## 2011-10-11 NOTE — Progress Notes (Signed)
Patient did not experience any of the following events: a burn prior to discharge; a fall within the facility; wrong site/side/patient/procedure/implant event; or a hospital transfer or hospital admission upon discharge from the facility. (G8907) Patient did not have preoperative order for IV antibiotic SSI prophylaxis. (G8918)  

## 2011-10-12 ENCOUNTER — Telehealth: Payer: Self-pay | Admitting: *Deleted

## 2011-10-12 ENCOUNTER — Telehealth: Payer: Self-pay | Admitting: Radiation Oncology

## 2011-10-12 DIAGNOSIS — C50419 Malignant neoplasm of upper-outer quadrant of unspecified female breast: Secondary | ICD-10-CM | POA: Diagnosis not present

## 2011-10-12 DIAGNOSIS — C50919 Malignant neoplasm of unspecified site of unspecified female breast: Secondary | ICD-10-CM | POA: Diagnosis not present

## 2011-10-12 DIAGNOSIS — Z510 Encounter for antineoplastic radiation therapy: Secondary | ICD-10-CM | POA: Diagnosis not present

## 2011-10-12 NOTE — Telephone Encounter (Signed)
Patient phoned this morning requesting that Dr. Dayton Scrape phone her at home. Number [phone]. Patient verbalized she had more questions related to radiation therapy. Offered to answer questions but, patient is persistent about speaking with Dr. Dayton Scrape. Informed patient Dr. Dayton Scrape would phone as soon as he became available. Routed this message to Dr. Dayton Scrape.

## 2011-10-12 NOTE — Telephone Encounter (Signed)
Called the pharmacy back.  Someone called [place] from [facility] on 10-07-2011.  I didn't see a response from [place]. I spoke to the patient since 10-07-2011 regarding her colonoscopy prep and procedure and the patient didn't have any medication issues.  I asked the pharmacist if there were any pending prescriptions for [place] and he said no there were not.

## 2011-10-12 NOTE — Telephone Encounter (Signed)
  Follow up Call-  Call back number 10/11/2011  Post procedure Call Back phone  # [phone]  Permission to leave phone message Yes     Patient questions:  Do you have a fever, pain , or abdominal swelling? no Pain Score  0 *  Have you tolerated food without any problems? yes  Have you been able to return to your normal activities? yes  Do you have any questions about your discharge instructions: Diet   no Medications  no Follow up visit  no  Do you have questions or concerns about your Care? no  Actions: * If pain score is 4 or above: No action needed, pain <4.

## 2011-10-17 ENCOUNTER — Ambulatory Visit
Admission: RE | Admit: 2011-10-17 | Discharge: 2011-10-17 | Disposition: A | Discharge status: Home / Self Care | Payer: Medicare Other | Source: Ambulatory Visit | Attending: Radiation Oncology | Admitting: Radiation Oncology

## 2011-10-17 ENCOUNTER — Encounter: Payer: Self-pay | Admitting: Radiation Oncology

## 2011-10-17 DIAGNOSIS — C50419 Malignant neoplasm of upper-outer quadrant of unspecified female breast: Secondary | ICD-10-CM | POA: Diagnosis not present

## 2011-10-17 DIAGNOSIS — Z510 Encounter for antineoplastic radiation therapy: Secondary | ICD-10-CM | POA: Diagnosis not present

## 2011-10-17 DIAGNOSIS — C50919 Malignant neoplasm of unspecified site of unspecified female breast: Secondary | ICD-10-CM | POA: Diagnosis not present

## 2011-10-17 MED ORDER — ALRA NON-METALLIC DEODORANT (RAD-ONC)
1.0000 "application " | Freq: Once | TOPICAL | Status: AC
  Administered 2011-10-17: 1 via TOPICAL

## 2011-10-17 MED ORDER — RADIAPLEXRX EX GEL
Freq: Once | CUTANEOUS | Status: AC
  Administered 2011-10-17: 18:00:00 via TOPICAL

## 2011-10-17 NOTE — Progress Notes (Signed)
Received patient and her husband in the clinic today for PUT with Dr. Dayton Scrape and POST [facility] EDUCATION with Sam, RN. Patient is alert and oriented to person, place, and time. No distress noted. Steady gait noted. Pleasant affect noted. Patient denies pain at this time. Oriented patient to staff and routine of the clinic. Provided patient with RADIATION THERAPY AND YOU handbook then, reviewed pertinent information. Educated patient on potential side effects and appropriate management. Provided patient with Radiaplex and Alra then, directed upon use. Provided patient with this writer's business card and encouraged to call with needs. Patient verbalized understanding of all things reviewed.

## 2011-10-17 NOTE — Progress Notes (Signed)
Simulation verification note:  The patient underwent simulation verification for treatment to her right breast. Her isocenter is in good position and the multileaf collimators contoured the treatment volume approriately.

## 2011-10-18 ENCOUNTER — Ambulatory Visit: Payer: Medicare Other

## 2011-10-18 ENCOUNTER — Ambulatory Visit
Admission: RE | Admit: 2011-10-18 | Discharge: 2011-10-18 | Disposition: A | Discharge status: Home / Self Care | Payer: Medicare Other | Source: Ambulatory Visit | Attending: Radiation Oncology | Admitting: Radiation Oncology

## 2011-10-18 ENCOUNTER — Encounter: Payer: Self-pay | Admitting: Internal Medicine

## 2011-10-18 DIAGNOSIS — C50919 Malignant neoplasm of unspecified site of unspecified female breast: Secondary | ICD-10-CM | POA: Diagnosis not present

## 2011-10-18 DIAGNOSIS — C50419 Malignant neoplasm of upper-outer quadrant of unspecified female breast: Secondary | ICD-10-CM | POA: Diagnosis not present

## 2011-10-18 DIAGNOSIS — Z510 Encounter for antineoplastic radiation therapy: Secondary | ICD-10-CM | POA: Diagnosis not present

## 2011-10-19 ENCOUNTER — Ambulatory Visit
Admission: RE | Admit: 2011-10-19 | Discharge: 2011-10-19 | Disposition: A | Discharge status: Home / Self Care | Payer: Medicare Other | Source: Ambulatory Visit | Attending: Radiation Oncology | Admitting: Radiation Oncology

## 2011-10-19 ENCOUNTER — Encounter: Payer: Medicare Other | Admitting: Internal Medicine

## 2011-10-19 DIAGNOSIS — Z510 Encounter for antineoplastic radiation therapy: Secondary | ICD-10-CM | POA: Diagnosis not present

## 2011-10-19 DIAGNOSIS — C50919 Malignant neoplasm of unspecified site of unspecified female breast: Secondary | ICD-10-CM | POA: Diagnosis not present

## 2011-10-20 ENCOUNTER — Ambulatory Visit
Admission: RE | Admit: 2011-10-20 | Discharge: 2011-10-20 | Disposition: A | Discharge status: Home / Self Care | Payer: Medicare Other | Source: Ambulatory Visit | Attending: Radiation Oncology | Admitting: Radiation Oncology

## 2011-10-20 DIAGNOSIS — Z510 Encounter for antineoplastic radiation therapy: Secondary | ICD-10-CM | POA: Diagnosis not present

## 2011-10-20 DIAGNOSIS — C50919 Malignant neoplasm of unspecified site of unspecified female breast: Secondary | ICD-10-CM | POA: Diagnosis not present

## 2011-10-21 ENCOUNTER — Ambulatory Visit
Admission: RE | Admit: 2011-10-21 | Discharge: 2011-10-21 | Disposition: A | Discharge status: Home / Self Care | Payer: Medicare Other | Source: Ambulatory Visit | Attending: Radiation Oncology | Admitting: Radiation Oncology

## 2011-10-21 DIAGNOSIS — Z510 Encounter for antineoplastic radiation therapy: Secondary | ICD-10-CM | POA: Diagnosis not present

## 2011-10-21 DIAGNOSIS — C50919 Malignant neoplasm of unspecified site of unspecified female breast: Secondary | ICD-10-CM | POA: Diagnosis not present

## 2011-10-24 ENCOUNTER — Ambulatory Visit
Admission: RE | Admit: 2011-10-24 | Discharge: 2011-10-24 | Disposition: A | Discharge status: Home / Self Care | Payer: Medicare Other | Source: Ambulatory Visit | Attending: Radiation Oncology | Admitting: Radiation Oncology

## 2011-10-24 ENCOUNTER — Encounter: Payer: Self-pay | Admitting: Radiation Oncology

## 2011-10-24 VITALS — BP 142/83 | HR 80 | Resp 18 | Wt 200.5 lb

## 2011-10-24 DIAGNOSIS — C50419 Malignant neoplasm of upper-outer quadrant of unspecified female breast: Secondary | ICD-10-CM

## 2011-10-24 DIAGNOSIS — C50919 Malignant neoplasm of unspecified site of unspecified female breast: Secondary | ICD-10-CM | POA: Diagnosis not present

## 2011-10-24 DIAGNOSIS — Z510 Encounter for antineoplastic radiation therapy: Secondary | ICD-10-CM | POA: Diagnosis not present

## 2011-10-24 NOTE — Progress Notes (Signed)
Patient presents to the clinic today accompanied by her husband for an under treat visit with Dr. Dayton Scrape. Patient is alert and oriented to person, place, and time. No distress noted. Steady gait noted. Pleasant affect noted. Patient denies pain at this time. Patient reports mild burning under her right axilla. Recommended that patient begin using Radiaplex as directed. Patient reports that occasional she gets a slight headache which does not require any medication. Patient denies nausea, vomiting, headache, dizziness or diarrhea. Patient has no other complaints at this time. Reported all findings to Dr. Dayton Scrape.

## 2011-10-24 NOTE — Progress Notes (Signed)
Weekly Management Note:  Site:R Breast Current Dose:  1000  cGy Projected Dose: 4800  cGy  Narrative: The patient is seen today for routine under treatment assessment. CBCT/MVCT images/port films were reviewed. The chart was reviewed.   No complaints today. She still remains somewhat anxious. She finished her antibiotics for her breast erythema/possible cellulitis.  Physical Examination:  Filed Vitals:   10/24/11 1114  BP: 142/83  Pulse: 80  Resp: 18  .  Weight: 200 lb 8 oz (90.946 kg). There is slight erythema along the right central breast which appears to be somewhat improved compared to last week.  Impression: Tolerating radiation therapy well.  Plan: Continue radiation therapy as planned.

## 2011-10-25 ENCOUNTER — Ambulatory Visit
Admission: RE | Admit: 2011-10-25 | Discharge: 2011-10-25 | Disposition: A | Discharge status: Home / Self Care | Payer: Medicare Other | Source: Ambulatory Visit | Attending: Radiation Oncology | Admitting: Radiation Oncology

## 2011-10-25 DIAGNOSIS — C50419 Malignant neoplasm of upper-outer quadrant of unspecified female breast: Secondary | ICD-10-CM | POA: Diagnosis not present

## 2011-10-25 DIAGNOSIS — Z510 Encounter for antineoplastic radiation therapy: Secondary | ICD-10-CM | POA: Diagnosis not present

## 2011-10-25 DIAGNOSIS — C50919 Malignant neoplasm of unspecified site of unspecified female breast: Secondary | ICD-10-CM | POA: Diagnosis not present

## 2011-10-26 ENCOUNTER — Ambulatory Visit
Admission: RE | Admit: 2011-10-26 | Discharge: 2011-10-26 | Disposition: A | Discharge status: Home / Self Care | Payer: Medicare Other | Source: Ambulatory Visit | Attending: Radiation Oncology | Admitting: Radiation Oncology

## 2011-10-26 VITALS — Resp 18 | Wt 201.2 lb

## 2011-10-26 DIAGNOSIS — E039 Hypothyroidism, unspecified: Secondary | ICD-10-CM | POA: Diagnosis not present

## 2011-10-26 DIAGNOSIS — C50419 Malignant neoplasm of upper-outer quadrant of unspecified female breast: Secondary | ICD-10-CM

## 2011-10-26 DIAGNOSIS — C50919 Malignant neoplasm of unspecified site of unspecified female breast: Secondary | ICD-10-CM | POA: Diagnosis not present

## 2011-10-26 DIAGNOSIS — Z0000 Encounter for general adult medical examination without abnormal findings: Secondary | ICD-10-CM | POA: Diagnosis not present

## 2011-10-26 DIAGNOSIS — Z510 Encounter for antineoplastic radiation therapy: Secondary | ICD-10-CM | POA: Diagnosis not present

## 2011-10-26 DIAGNOSIS — M949 Disorder of cartilage, unspecified: Secondary | ICD-10-CM | POA: Diagnosis not present

## 2011-10-26 DIAGNOSIS — E785 Hyperlipidemia, unspecified: Secondary | ICD-10-CM | POA: Diagnosis not present

## 2011-10-26 NOTE — Progress Notes (Signed)
Patient presents to the clinic today unaccompanied requesting to be seen by Dr. Dayton Scrape following treatment. Patient is alert and oriented to person, place, and time. No distress noted. Steady gait noted. Pleasant affect noted. Patient denies pain at this time. Patient requesting her skin be checked by Dr. Dayton Scrape because "its red and afraid infection is back." Slight hyperpigmentation of the treated/right breast noted without edema or warmth. Also, patient reports occasional sharp shooting pain in her treated breast. Instructed patient all symptoms were normal. Reinforced skin care. Reviewed potential side effects and management. Encouraged patient to use Radiaplex and Alra as directed. Reported all findings to Dr. Dayton Scrape.

## 2011-10-26 NOTE — Progress Notes (Signed)
Weekly Management Note:  Site:R Breast Current Dose:  1400  cGy Projected Dose: 4800  cGy  Narrative: The patient is seen today for routine under treatment assessment. CBCT/MVCT images/port films were reviewed. The chart was reviewed.   The patient is seen today with a complaint of shooting right breast pains. These lasted a few seconds she has not had them for some time. She remains quite anxious and also is to make sure that she does not have an infection.  Physical Examination:  Filed Vitals:   10/26/11 1127  Resp: 18  .  Weight: 201 lb 3.2 oz (91.264 kg). There is slight central erythema along the right breast which is unchanged. There is no pain on palpation of the right breast.  Impression: Tolerating radiation therapy well. I suspect that her right breast pain is from reinnervation of damage nerves within the right breast. Her symptoms are typical. She is reassured.  Plan: Continue radiation therapy as planned.

## 2011-10-27 ENCOUNTER — Ambulatory Visit
Admission: RE | Admit: 2011-10-27 | Discharge: 2011-10-27 | Disposition: A | Discharge status: Home / Self Care | Payer: Medicare Other | Source: Ambulatory Visit | Attending: Radiation Oncology | Admitting: Radiation Oncology

## 2011-10-27 DIAGNOSIS — C50919 Malignant neoplasm of unspecified site of unspecified female breast: Secondary | ICD-10-CM | POA: Diagnosis not present

## 2011-10-27 DIAGNOSIS — Z510 Encounter for antineoplastic radiation therapy: Secondary | ICD-10-CM | POA: Diagnosis not present

## 2011-10-28 ENCOUNTER — Ambulatory Visit
Admission: RE | Admit: 2011-10-28 | Discharge: 2011-10-28 | Disposition: A | Discharge status: Home / Self Care | Payer: Medicare Other | Source: Ambulatory Visit | Attending: Radiation Oncology | Admitting: Radiation Oncology

## 2011-10-28 DIAGNOSIS — Z510 Encounter for antineoplastic radiation therapy: Secondary | ICD-10-CM | POA: Diagnosis not present

## 2011-10-28 DIAGNOSIS — C50919 Malignant neoplasm of unspecified site of unspecified female breast: Secondary | ICD-10-CM | POA: Diagnosis not present

## 2011-10-31 ENCOUNTER — Ambulatory Visit
Admission: RE | Admit: 2011-10-31 | Discharge: 2011-10-31 | Disposition: A | Discharge status: Home / Self Care | Payer: Medicare Other | Source: Ambulatory Visit | Attending: Radiation Oncology | Admitting: Radiation Oncology

## 2011-10-31 ENCOUNTER — Encounter: Payer: Self-pay | Admitting: Radiation Oncology

## 2011-10-31 DIAGNOSIS — Z510 Encounter for antineoplastic radiation therapy: Secondary | ICD-10-CM | POA: Diagnosis not present

## 2011-10-31 DIAGNOSIS — C50419 Malignant neoplasm of upper-outer quadrant of unspecified female breast: Secondary | ICD-10-CM

## 2011-10-31 DIAGNOSIS — C50919 Malignant neoplasm of unspecified site of unspecified female breast: Secondary | ICD-10-CM | POA: Diagnosis not present

## 2011-10-31 NOTE — Progress Notes (Signed)
   Weekly Management Note, right breast cancer Current Dose:   2000 cGy  Projected Dose: 4800 cGy   Narrative:  The patient presents for routine under treatment assessment.  CBCT/MVCT images/Port film x-rays were reviewed.  The chart was checked. She is doing well. The skin over her right breast is slightly erythematous.  Physical Findings: Weight:  . There were no vitals filed for this visit. She has erythema over her right breast. Skin is intact. There is a red bump in the upper inner quadrant. It is not concerning; it is consistent with a erythematous mole  Impression:  The patient is tolerating radiotherapy.  Plan:  Continue radiotherapy as planned.

## 2011-10-31 NOTE — Progress Notes (Signed)
HERE TODAY FOR PUT.  SKIN LOOKS GOOD, PINK IN COLOR.  SHE POINTS OUT A RED BUMP ON HER BREAST THAT IS NEW.

## 2011-11-01 ENCOUNTER — Ambulatory Visit
Admission: RE | Admit: 2011-11-01 | Discharge: 2011-11-01 | Disposition: A | Discharge status: Home / Self Care | Payer: Medicare Other | Source: Ambulatory Visit | Attending: Radiation Oncology | Admitting: Radiation Oncology

## 2011-11-01 DIAGNOSIS — C50419 Malignant neoplasm of upper-outer quadrant of unspecified female breast: Secondary | ICD-10-CM | POA: Diagnosis not present

## 2011-11-01 DIAGNOSIS — Z510 Encounter for antineoplastic radiation therapy: Secondary | ICD-10-CM | POA: Diagnosis not present

## 2011-11-01 DIAGNOSIS — C50919 Malignant neoplasm of unspecified site of unspecified female breast: Secondary | ICD-10-CM | POA: Diagnosis not present

## 2011-11-02 ENCOUNTER — Ambulatory Visit
Admission: RE | Admit: 2011-11-02 | Discharge: 2011-11-02 | Disposition: A | Discharge status: Home / Self Care | Payer: Medicare Other | Source: Ambulatory Visit | Attending: Radiation Oncology | Admitting: Radiation Oncology

## 2011-11-02 DIAGNOSIS — Z510 Encounter for antineoplastic radiation therapy: Secondary | ICD-10-CM | POA: Diagnosis not present

## 2011-11-02 DIAGNOSIS — C50919 Malignant neoplasm of unspecified site of unspecified female breast: Secondary | ICD-10-CM | POA: Diagnosis not present

## 2011-11-03 ENCOUNTER — Ambulatory Visit
Admission: RE | Admit: 2011-11-03 | Discharge: 2011-11-03 | Disposition: A | Discharge status: Home / Self Care | Payer: Medicare Other | Source: Ambulatory Visit | Attending: Radiation Oncology | Admitting: Radiation Oncology

## 2011-11-03 DIAGNOSIS — Z510 Encounter for antineoplastic radiation therapy: Secondary | ICD-10-CM | POA: Diagnosis not present

## 2011-11-03 DIAGNOSIS — E785 Hyperlipidemia, unspecified: Secondary | ICD-10-CM | POA: Diagnosis not present

## 2011-11-03 DIAGNOSIS — Z0000 Encounter for general adult medical examination without abnormal findings: Secondary | ICD-10-CM | POA: Diagnosis not present

## 2011-11-03 DIAGNOSIS — C50919 Malignant neoplasm of unspecified site of unspecified female breast: Secondary | ICD-10-CM | POA: Diagnosis not present

## 2011-11-03 DIAGNOSIS — E039 Hypothyroidism, unspecified: Secondary | ICD-10-CM | POA: Diagnosis not present

## 2011-11-03 DIAGNOSIS — R7309 Other abnormal glucose: Secondary | ICD-10-CM | POA: Diagnosis not present

## 2011-11-04 ENCOUNTER — Ambulatory Visit
Admission: RE | Admit: 2011-11-04 | Discharge: 2011-11-04 | Disposition: A | Discharge status: Home / Self Care | Payer: Medicare Other | Source: Ambulatory Visit | Attending: Radiation Oncology | Admitting: Radiation Oncology

## 2011-11-04 DIAGNOSIS — Z510 Encounter for antineoplastic radiation therapy: Secondary | ICD-10-CM | POA: Diagnosis not present

## 2011-11-04 DIAGNOSIS — C50919 Malignant neoplasm of unspecified site of unspecified female breast: Secondary | ICD-10-CM | POA: Diagnosis not present

## 2011-11-07 ENCOUNTER — Ambulatory Visit
Admission: RE | Admit: 2011-11-07 | Discharge: 2011-11-07 | Disposition: A | Discharge status: Home / Self Care | Payer: Medicare Other | Source: Ambulatory Visit | Attending: Radiation Oncology | Admitting: Radiation Oncology

## 2011-11-07 ENCOUNTER — Encounter: Payer: Self-pay | Admitting: Radiation Oncology

## 2011-11-07 ENCOUNTER — Ambulatory Visit: Admission: RE | Admit: 2011-11-07 | Payer: Medicare Other | Source: Ambulatory Visit

## 2011-11-07 VITALS — BP 144/86 | HR 82 | Resp 18 | Wt 201.5 lb

## 2011-11-07 DIAGNOSIS — C50419 Malignant neoplasm of upper-outer quadrant of unspecified female breast: Secondary | ICD-10-CM

## 2011-11-07 DIAGNOSIS — C50919 Malignant neoplasm of unspecified site of unspecified female breast: Secondary | ICD-10-CM | POA: Diagnosis not present

## 2011-11-07 DIAGNOSIS — Z510 Encounter for antineoplastic radiation therapy: Secondary | ICD-10-CM | POA: Diagnosis not present

## 2011-11-07 NOTE — Progress Notes (Signed)
Weekly Management Note:  Site:R breast  Current Dose:  3000  cGy Projected Dose: 4800  cGy  Narrative: The patient is seen today for routine under treatment assessment. CBCT/MVCT images/port films were reviewed. The chart was reviewed.   She uses Radioplex gel when necessary. No new complaints today.  Physical Examination:  Filed Vitals:   11/07/11 1133  BP: 144/86  Pulse: 82  Resp: 18  .  Weight: 201 lb 8 oz (91.4 kg). There is erythema the skin along the right breast, particularly the inframammary region. No areas of desquamation.  Impression: Tolerating radiation therapy well.  Plan: Continue radiation therapy as planned.

## 2011-11-07 NOTE — Progress Notes (Signed)
Patient presents to the clinic today accompanied by her husband for an under treat visit with Dr. Dayton Scrape. Patient is alert and oriented to person, place, and time. No distress noted. Steady gait noted. Pleasant affect noted. Patient denies pain at this time. Patient reports she is eating and sleeping well. Patient reports using Radiaplex as directed. Skin appears normal without hyperpigmentation or desquamation. Reported all findings to Dr. Dayton Scrape.

## 2011-11-08 ENCOUNTER — Encounter (INDEPENDENT_AMBULATORY_CARE_PROVIDER_SITE_OTHER): Payer: Self-pay | Admitting: General Surgery

## 2011-11-08 ENCOUNTER — Ambulatory Visit (INDEPENDENT_AMBULATORY_CARE_PROVIDER_SITE_OTHER): Payer: Medicare Other | Admitting: General Surgery

## 2011-11-08 ENCOUNTER — Ambulatory Visit
Admission: RE | Admit: 2011-11-08 | Discharge: 2011-11-08 | Disposition: A | Discharge status: Home / Self Care | Payer: Medicare Other | Source: Ambulatory Visit | Attending: Radiation Oncology | Admitting: Radiation Oncology

## 2011-11-08 VITALS — BP 120/76 | HR 68 | Temp 97.4°F | Resp 16 | Ht 64.0 in | Wt 201.2 lb

## 2011-11-08 DIAGNOSIS — Z510 Encounter for antineoplastic radiation therapy: Secondary | ICD-10-CM | POA: Diagnosis not present

## 2011-11-08 DIAGNOSIS — C50419 Malignant neoplasm of upper-outer quadrant of unspecified female breast: Secondary | ICD-10-CM | POA: Diagnosis not present

## 2011-11-08 DIAGNOSIS — C50919 Malignant neoplasm of unspecified site of unspecified female breast: Secondary | ICD-10-CM | POA: Diagnosis not present

## 2011-11-08 NOTE — Patient Instructions (Signed)
Your breast incision and your axillary incision have healed nicely. There is no sign of any complication. There is no sign of any nerve damage or swelling of the arm.  Keep your followup appointments with Dr. Donnie Coffin and Dr. Dayton Scrape.  Repeat bilateral mammograms in January 2013.  Return to see Dr. Derrell Lolling in February 2013.

## 2011-11-08 NOTE — Progress Notes (Signed)
Subjective:     Patient ID: Jamie Cole, female   DOB: [birth year], 71 y.o.   MRN: [id number]  HPI This patient underwent right partial mastectomy and sentinel node biopsy on September 14, 2011. Her final pathology report showed invasive ductal carcinoma, low-grade, 0.9 cm, nodes negative, HER-2-negative, receptor positive, Ki-67 19%.  T1b, N0.  She has almost finished her radiation therapy under the guidance of Dr. Chipper Herb. She plans to start arimidex after the radiation therapy and will followup with Dr. Donnie Coffin in a month or 2. She is also on vitamin D.  She has no complaints about her breast. She denies arm swelling or numbness. She has good range of motion.  Review of Systems     Objective:   Physical Exam Patient looks well. In good spirits. In no distress. Her husband is with her.  Right arm shows no swelling or nerve deficit.  Right breast is pink. The incisions has to have healed well. There is no sign of any infection or seroma or hematoma. Right breast is slightly smaller than the left. Cosmetic results otherwise very good.    Assessment:     Low-grade invasive ductal carcinoma right breast, pathologic stage TI B., N0, receptor positive, HER-2-negative, Ki-67 19%.  Doing well 2 months postop right partial mastectomy and sentinel muscle biopsy.    Plan:     Complete radiation therapy, and then start arimidex.  She will follow up with Dr. Donnie Coffin soon.   MGM's should be repeated in January 2014 and see me in Feb., 2014   [name]. Derrell Lolling, M.D., [facility] General and Minimally invasive Surgery Breast and Colorectal Surgery Office:   [phone] Pager:   [phone]

## 2011-11-09 ENCOUNTER — Ambulatory Visit
Admission: RE | Admit: 2011-11-09 | Discharge: 2011-11-09 | Disposition: A | Discharge status: Home / Self Care | Payer: Medicare Other | Source: Ambulatory Visit | Attending: Radiation Oncology | Admitting: Radiation Oncology

## 2011-11-09 DIAGNOSIS — Z510 Encounter for antineoplastic radiation therapy: Secondary | ICD-10-CM | POA: Diagnosis not present

## 2011-11-09 DIAGNOSIS — C50919 Malignant neoplasm of unspecified site of unspecified female breast: Secondary | ICD-10-CM | POA: Diagnosis not present

## 2011-11-10 ENCOUNTER — Encounter (INDEPENDENT_AMBULATORY_CARE_PROVIDER_SITE_OTHER): Payer: Medicare Other | Admitting: General Surgery

## 2011-11-10 ENCOUNTER — Ambulatory Visit
Admission: RE | Admit: 2011-11-10 | Discharge: 2011-11-10 | Disposition: A | Discharge status: Home / Self Care | Payer: Medicare Other | Source: Ambulatory Visit | Attending: Radiation Oncology | Admitting: Radiation Oncology

## 2011-11-10 ENCOUNTER — Encounter: Payer: Self-pay | Admitting: Radiation Oncology

## 2011-11-10 DIAGNOSIS — Z510 Encounter for antineoplastic radiation therapy: Secondary | ICD-10-CM | POA: Diagnosis not present

## 2011-11-10 DIAGNOSIS — C50919 Malignant neoplasm of unspecified site of unspecified female breast: Secondary | ICD-10-CM | POA: Diagnosis not present

## 2011-11-10 NOTE — Progress Notes (Signed)
Weekly Management Note:  Site:R Breast Current Dose:  3600  cGy Projected Dose: 4800  cGy  Narrative: The patient is seen today for routine under treatment assessment. CBCT/MVCT images/port films were reviewed. The chart was reviewed.   No complaints today. She seen on the treatment unit.  Physical Examination: There were no vitals filed for this visit..  Weight:  . There is mild erythema the skin, particularly in the right inframammary region.  Impression: Tolerating radiation therapy well.  Plan: Continue radiation therapy as planned.

## 2011-11-11 ENCOUNTER — Ambulatory Visit
Admission: RE | Admit: 2011-11-11 | Discharge: 2011-11-11 | Disposition: A | Discharge status: Home / Self Care | Payer: Medicare Other | Source: Ambulatory Visit | Attending: Radiation Oncology | Admitting: Radiation Oncology

## 2011-11-11 DIAGNOSIS — C50919 Malignant neoplasm of unspecified site of unspecified female breast: Secondary | ICD-10-CM | POA: Diagnosis not present

## 2011-11-11 DIAGNOSIS — Z510 Encounter for antineoplastic radiation therapy: Secondary | ICD-10-CM | POA: Diagnosis not present

## 2011-11-14 ENCOUNTER — Ambulatory Visit
Admission: RE | Admit: 2011-11-14 | Discharge: 2011-11-14 | Disposition: A | Discharge status: Home / Self Care | Payer: Medicare Other | Source: Ambulatory Visit | Attending: Radiation Oncology | Admitting: Radiation Oncology

## 2011-11-14 DIAGNOSIS — Z510 Encounter for antineoplastic radiation therapy: Secondary | ICD-10-CM | POA: Diagnosis not present

## 2011-11-14 DIAGNOSIS — C50919 Malignant neoplasm of unspecified site of unspecified female breast: Secondary | ICD-10-CM | POA: Diagnosis not present

## 2011-11-15 ENCOUNTER — Ambulatory Visit
Admission: RE | Admit: 2011-11-15 | Discharge: 2011-11-15 | Disposition: A | Discharge status: Home / Self Care | Payer: Medicare Other | Source: Ambulatory Visit | Attending: Radiation Oncology | Admitting: Radiation Oncology

## 2011-11-15 ENCOUNTER — Encounter: Payer: Self-pay | Admitting: Radiation Oncology

## 2011-11-15 VITALS — BP 130/82 | HR 72 | Resp 18 | Wt 202.5 lb

## 2011-11-15 DIAGNOSIS — C50419 Malignant neoplasm of upper-outer quadrant of unspecified female breast: Secondary | ICD-10-CM

## 2011-11-15 DIAGNOSIS — C50919 Malignant neoplasm of unspecified site of unspecified female breast: Secondary | ICD-10-CM | POA: Diagnosis not present

## 2011-11-15 DIAGNOSIS — Z510 Encounter for antineoplastic radiation therapy: Secondary | ICD-10-CM | POA: Diagnosis not present

## 2011-11-15 MED ORDER — RADIAPLEXRX EX GEL
Freq: Once | CUTANEOUS | Status: AC
  Administered 2011-11-15: 1 via TOPICAL

## 2011-11-15 NOTE — Telephone Encounter (Signed)
completed

## 2011-11-15 NOTE — Progress Notes (Signed)
Weekly Management Note:  Site:R Breast Current Dose:  4200  cGy Projected Dose: 4800  cGy  Narrative: The patient is seen today for routine under treatment assessment. CBCT/MVCT images/port films were reviewed. The chart was reviewed.   No new complaints today. She continues with her Radioplex gel.  Physical Examination:  Filed Vitals:   11/15/11 1116  BP: 130/82  Pulse: 72  Resp: 18  .  Weight: 202 lb 8 oz (91.853 kg). There is erythema along the right breast, particularly along the inframammary region and lower axilla as expected.  Impression: Tolerating radiation therapy well. She'll finish her radiation therapy this Friday.  Plan: Continue radiation therapy as planned. She was given a one-month followup appointment.

## 2011-11-15 NOTE — Progress Notes (Signed)
Patient presents to the clinic today accompanied by her husband for an under treat visit with Dr. Dayton Scrape. Patient is alert and oriented to person, place, and time. No distress noted. Steady gait noted. Pleasant affect noted. Patient denies pain at this time. Hyperpigmentation without desquamation noted of the right/treated breast. Patient has no other complaints at this time. Reported all findings to Dr. Dayton Scrape. Appointment card given to schedule one month follow up. Encouraged to call with needs. Encouraged to use Radiaplex for a least two weeks following completion twice daily.  Patient verbalized understanding.

## 2011-11-16 ENCOUNTER — Ambulatory Visit
Admission: RE | Admit: 2011-11-16 | Discharge: 2011-11-16 | Disposition: A | Discharge status: Home / Self Care | Payer: Medicare Other | Source: Ambulatory Visit | Attending: Radiation Oncology | Admitting: Radiation Oncology

## 2011-11-16 DIAGNOSIS — Z510 Encounter for antineoplastic radiation therapy: Secondary | ICD-10-CM | POA: Diagnosis not present

## 2011-11-16 DIAGNOSIS — C50919 Malignant neoplasm of unspecified site of unspecified female breast: Secondary | ICD-10-CM | POA: Diagnosis not present

## 2011-11-17 ENCOUNTER — Ambulatory Visit
Admission: RE | Admit: 2011-11-17 | Discharge: 2011-11-17 | Disposition: A | Discharge status: Home / Self Care | Payer: Medicare Other | Source: Ambulatory Visit | Attending: Radiation Oncology | Admitting: Radiation Oncology

## 2011-11-17 DIAGNOSIS — C50919 Malignant neoplasm of unspecified site of unspecified female breast: Secondary | ICD-10-CM | POA: Diagnosis not present

## 2011-11-17 DIAGNOSIS — Z510 Encounter for antineoplastic radiation therapy: Secondary | ICD-10-CM | POA: Diagnosis not present

## 2011-11-18 ENCOUNTER — Ambulatory Visit: Payer: Medicare Other

## 2011-11-18 ENCOUNTER — Ambulatory Visit
Admission: RE | Admit: 2011-11-18 | Discharge: 2011-11-18 | Disposition: A | Discharge status: Home / Self Care | Payer: Medicare Other | Source: Ambulatory Visit | Attending: Radiation Oncology | Admitting: Radiation Oncology

## 2011-11-18 DIAGNOSIS — Z510 Encounter for antineoplastic radiation therapy: Secondary | ICD-10-CM | POA: Diagnosis not present

## 2011-11-18 DIAGNOSIS — C50919 Malignant neoplasm of unspecified site of unspecified female breast: Secondary | ICD-10-CM | POA: Diagnosis not present

## 2011-11-21 ENCOUNTER — Encounter: Payer: Self-pay | Admitting: Radiation Oncology

## 2011-11-21 ENCOUNTER — Ambulatory Visit: Payer: Medicare Other

## 2011-11-21 NOTE — Progress Notes (Signed)
[facility] Radiation Oncology End of Treatment Note  Name:Jamie Cole  Date: 11/21/2011 JXB:[id number] DOB:24-Jun-1941   Status:outpatient    CC: Gaspar Garbe, MD, MD  Dr. Claud Kelp  REFERRING PHYSICIAN: Dr. Claud Kelp    DIAGNOSIS: Stage I (T1, N0, M0) invasive ductal/DCIS of the right breast   INDICATION FOR TREATMENT: Curative   TREATMENT DATES: 10/18/2011 through 11/18/2011                          SITE/DOSE:  Right breast 4800 cGy 24 sessions (no boost)                          BEAMS/ENERGY:    Mixed 6 MV/10 MV photons tangential fields to the right breast               NARRATIVE:     The patient tolerated her treatment well with only moderate radiation dermatitis by completion of therapy.  She used Radioplex gel when necessary.                     PLAN: Routine followup in one month. Patient instructed to call if questions or worsening complaints in interim.

## 2011-11-22 ENCOUNTER — Ambulatory Visit: Payer: Medicare Other

## 2011-11-23 ENCOUNTER — Ambulatory Visit: Payer: Medicare Other

## 2011-11-24 ENCOUNTER — Encounter (INDEPENDENT_AMBULATORY_CARE_PROVIDER_SITE_OTHER): Payer: Self-pay

## 2011-11-24 ENCOUNTER — Ambulatory Visit: Payer: Medicare Other

## 2011-11-25 ENCOUNTER — Ambulatory Visit: Payer: Medicare Other

## 2011-11-28 ENCOUNTER — Ambulatory Visit: Payer: Medicare Other

## 2011-12-09 ENCOUNTER — Encounter: Payer: Self-pay | Admitting: Radiation Oncology

## 2011-12-09 DIAGNOSIS — Z923 Personal history of irradiation: Secondary | ICD-10-CM | POA: Insufficient documentation

## 2011-12-14 ENCOUNTER — Ambulatory Visit: Payer: Medicare Other | Admitting: Radiation Oncology

## 2011-12-14 ENCOUNTER — Ambulatory Visit
Admission: RE | Admit: 2011-12-14 | Discharge: 2011-12-14 | Disposition: A | Discharge status: Home / Self Care | Payer: Medicare Other | Source: Ambulatory Visit | Attending: Radiation Oncology | Admitting: Radiation Oncology

## 2011-12-14 ENCOUNTER — Encounter: Payer: Self-pay | Admitting: Radiation Oncology

## 2011-12-14 VITALS — BP 121/77 | HR 71 | Temp 97.0°F | Resp 20 | Wt 202.8 lb

## 2011-12-14 DIAGNOSIS — C50419 Malignant neoplasm of upper-outer quadrant of unspecified female breast: Secondary | ICD-10-CM

## 2011-12-14 NOTE — Progress Notes (Signed)
Followup note:  The patient returns today approximately 1 month following completion of radiation therapy following conservative surgery in the management of her T1 N0 invasive ductal/DCIS of the right breast. She is to do well. She is now on her Arimidex. She'll see Dr. Donnie Coffin in late May. She does remain quite anxious.  Physical examination: Alert and oriented. Vital signs: Wt Readings from Last 3 Encounters:  12/14/11 202 lb 12.8 oz (91.989 kg)  11/15/11 202 lb 8 oz (91.853 kg)  11/08/11 201 lb 4 oz (91.286 kg)   Temp Readings from Last 3 Encounters:  12/14/11 97 F (36.1 C) Oral  11/08/11 97.4 F (36.3 C) Temporal  10/11/11 97 F (36.1 C)    BP Readings from Last 3 Encounters:  12/14/11 121/77  11/15/11 130/82  11/08/11 120/76   Pulse Readings from Last 3 Encounters:  12/14/11 71  11/15/11 72  11/08/11 68   Head and neck examination: Grossly unremarkable. Nodes: Without palpable cervical, supraclavicular, or axillary lymphadenopathy. Chest: Lungs clear. Rest: There is residual erythema along the right breast with slight keloid formation along her breast and axillary scars. No masses are appreciated. There is slight thickening of the right breast. Left breast without masses or lesions.  Impression: Satisfactory progress.  Plan: She typically gets her mammography in January, and I do feel comfortable waiting until January to have a baseline mammogram on the right a screening mammogram on the left. On the other hand, is perfectly acceptable for her to her have a baseline right breast mammogram in July and then have bilateral mammography in January. I defer to Dr. Donnie Coffin who will see her in late May. I've not scheduled the patient for a formal followup visit. She'll also maintain followup with Dr. Derrell Lolling .

## 2011-12-14 NOTE — Progress Notes (Signed)
Pt has no c/o, still lingering fatigue.

## 2011-12-20 DIAGNOSIS — Z9189 Other specified personal risk factors, not elsewhere classified: Secondary | ICD-10-CM | POA: Diagnosis not present

## 2011-12-20 DIAGNOSIS — E559 Vitamin D deficiency, unspecified: Secondary | ICD-10-CM | POA: Diagnosis not present

## 2011-12-20 DIAGNOSIS — Z01419 Encounter for gynecological examination (general) (routine) without abnormal findings: Secondary | ICD-10-CM | POA: Diagnosis not present

## 2011-12-20 DIAGNOSIS — Z124 Encounter for screening for malignant neoplasm of cervix: Secondary | ICD-10-CM | POA: Diagnosis not present

## 2011-12-29 ENCOUNTER — Ambulatory Visit (HOSPITAL_BASED_OUTPATIENT_CLINIC_OR_DEPARTMENT_OTHER): Payer: Medicare Other | Admitting: Oncology

## 2011-12-29 ENCOUNTER — Telehealth: Payer: Self-pay | Admitting: Oncology

## 2011-12-29 VITALS — BP 148/76 | HR 71 | Ht 64.0 in | Wt 201.8 lb

## 2011-12-29 DIAGNOSIS — Z170 Estrogen receptor positive status [ER+]: Secondary | ICD-10-CM

## 2011-12-29 DIAGNOSIS — H2510 Age-related nuclear cataract, unspecified eye: Secondary | ICD-10-CM | POA: Diagnosis not present

## 2011-12-29 DIAGNOSIS — H25049 Posterior subcapsular polar age-related cataract, unspecified eye: Secondary | ICD-10-CM | POA: Diagnosis not present

## 2011-12-29 DIAGNOSIS — L910 Hypertrophic scar: Secondary | ICD-10-CM

## 2011-12-29 DIAGNOSIS — C50419 Malignant neoplasm of upper-outer quadrant of unspecified female breast: Secondary | ICD-10-CM

## 2011-12-29 DIAGNOSIS — C9210 Chronic myeloid leukemia, BCR/ABL-positive, not having achieved remission: Secondary | ICD-10-CM

## 2011-12-29 DIAGNOSIS — E559 Vitamin D deficiency, unspecified: Secondary | ICD-10-CM

## 2011-12-29 NOTE — Progress Notes (Signed)
Hematology and Oncology Follow Up Visit  Jamie Cole [id number] 1940-09-07 71 y.o. 12/29/2011 11:57 AM PCP  Principle Diagnosis: 71 yo  woman with history of breast cancer, er+ T1b, N0   Interim History: lumpectomy 09/14/11; xrt completed 11/18/11 on arimidex Medications: I have reviewed the patient's current medications.  Allergies: No Known Allergies  Past Medical History, Surgical history, Social history, and Family History were reviewed and updated.  Review of Systems: Constitutional:  Negative for fever, chills, night sweats, anorexia, weight loss, pain. Cardiovascular: no chest pain or dyspnea on exertion Respiratory: negative Neurological: negative Dermatological: negative ENT: negative Skin has noted some keloid formatioj on breast Gastrointestinal: negative Genito-Urinary: negative Hematological and Lymphatic: negative Breast: negative Musculoskeletal: negative Remaining ROS negative.  Physical Exam: Blood pressure 148/76, pulse 71, height 5\' 4"  (1.626 m), weight 201 lb 12.8 oz (91.536 kg). ECOG: 0 Oropharynx- nl Chest-clear Heart-wnl Abdomen-wnl Breasts- wnl, keloid on scars, no adenopathy Abdomen- wnl Ext- wnl    Lab Results: Lab Results  Component Value Date   WBC 6.3 10/04/2011   HGB 13.9 10/04/2011   HCT 40.2 10/04/2011   MCV 92.2 10/04/2011   PLT 259 10/04/2011     Chemistry      Component Value Date/Time   NA 140 10/04/2011 2159   K 4.0 10/04/2011 2159   CL 105 10/04/2011 2159   CO2 26 10/04/2011 2159   BUN 21 10/04/2011 2159   CREATININE 0.60 10/04/2011 2159      Component Value Date/Time   CALCIUM 9.4 10/04/2011 2159   ALKPHOS 102 10/04/2011 2159   AST 14 10/04/2011 2159   ALT 11 10/04/2011 2159   BILITOT 0.1* 10/04/2011 2159      .pathology. Radiological Studies: chest X-ray n/a Mammogram n/a Bone density n/a  Impression and Plan: 71 year old woman with history of ER/PR positive grade 1 breast cancer T1bN 0, She has completed xrt and is on  arimidex which she tolerates.  I have recommended she see a dermatologist, re the keloid areas.  I will see her in 6  Months, and will get a mammogram in October. Her bone density will be scheduled for 12/13. More than 50% of the visit was spent in patient-related counselling   Pierce Crane, MD 5/30/201311:57 AM

## 2011-12-29 NOTE — Telephone Encounter (Signed)
gve the pt her nov,dec 2013 appt calendar along with the mammo appt 

## 2012-01-03 ENCOUNTER — Telehealth (INDEPENDENT_AMBULATORY_CARE_PROVIDER_SITE_OTHER): Payer: Self-pay | Admitting: General Surgery

## 2012-01-03 NOTE — Telephone Encounter (Signed)
Patient called in stating that her incision site has been slightly tender, red and feels lumpy. Patient requested to see Dr. Derrell Lolling. I asked her how long the site has been this way. Patient stated surgery was 09/14/11, and she has been this way for awhile at least a month. He is DOW this week, so he does not have clinic this week. I advised the patient that either myself or his nurse Arline Asp will call her back with an appointment date. Patient gave permission for message to be left if she is unable to take call. Call back # [phone].

## 2012-01-03 NOTE — Telephone Encounter (Signed)
Called back patient to advise that I spoke with Dr. Jacinto Halim nurse who advised she be seen in urgent office. Confirmed patient to be seen at 3:45 on 01/04/12. Patient had partial mastectomy w/LOC and sentinel node biopsy 09/14/11.

## 2012-01-03 NOTE — Telephone Encounter (Signed)
Addition to message just typed:  Patient stated there was no discharge or odor from the site.

## 2012-01-04 ENCOUNTER — Ambulatory Visit (INDEPENDENT_AMBULATORY_CARE_PROVIDER_SITE_OTHER): Payer: Medicare Other | Admitting: Surgery

## 2012-01-04 ENCOUNTER — Encounter (INDEPENDENT_AMBULATORY_CARE_PROVIDER_SITE_OTHER): Payer: Self-pay | Admitting: Surgery

## 2012-01-04 VITALS — BP 116/76 | HR 79 | Temp 98.0°F | Ht 64.0 in | Wt 202.4 lb

## 2012-01-04 DIAGNOSIS — N61 Mastitis without abscess: Secondary | ICD-10-CM | POA: Diagnosis not present

## 2012-01-04 NOTE — Progress Notes (Signed)
Subjective:     Patient ID: Jamie Cole, female   DOB: 14-Jan-1941, 71 y.o.   MRN: [id number]  HPI This is a patient of Dr. Jacinto Halim who had a right breast lobectomy and sentinel node biopsy in February. She has completed her course of radiation. She reports she has noticed increasing erythema at the incisions as was a fullness in the right axilla. She has had no fever. She reports erythematous and along the incisions and has not really changed in the last several weeks. She previously had cellulitis  postoperatively  Review of Systems     Objective:   Physical Exam On exam, the 2 incisions appear well healed. There are tiny areas of erythema along the incision at all suture sites. There is also some mild fullness which is nontender in the right axilla. There is no fluctuance    Assessment:     Mild incisional infection versus radiation changes in    Plan:     I would go ahead and place her back on doxycycline. I will have her follow up with Dr. Derrell Lolling in approximately 2 weeks

## 2012-01-24 ENCOUNTER — Ambulatory Visit (INDEPENDENT_AMBULATORY_CARE_PROVIDER_SITE_OTHER): Payer: Medicare Other | Admitting: General Surgery

## 2012-01-24 ENCOUNTER — Encounter (INDEPENDENT_AMBULATORY_CARE_PROVIDER_SITE_OTHER): Payer: Self-pay | Admitting: General Surgery

## 2012-01-24 VITALS — BP 124/70 | HR 70 | Temp 97.9°F | Resp 16 | Ht 64.0 in | Wt 204.0 lb

## 2012-01-24 DIAGNOSIS — C50419 Malignant neoplasm of upper-outer quadrant of unspecified female breast: Secondary | ICD-10-CM

## 2012-01-24 NOTE — Patient Instructions (Signed)
The  red spots on your right axillary incision and your right lumpectomy incision are unusual. I do not think you have any significant cellulitis or deep infection in the breast. This may be a reaction to suture material, or maybe some other form of dermatologic condition. I am not sure.  Since it is getting better I would do nothing further at this time.  You do not need to take any further antibiotics.  Return to see Dr. Derrell Lolling in 6 months after you get your annual mammograms.  If the red spots flare up again, call Dr. Derrell Lolling recall and he will arrange dermatologic consultation.

## 2012-01-24 NOTE — Progress Notes (Signed)
Subjective:     Patient ID: Jamie Cole, female   DOB: 10-02-1940, 71 y.o.   MRN: [id number]  HPI  This patient returns for a recheck of redness in her right breast and right axillla. On 09/14/11 she  underwent right partial mastectomy and sentinal node biopsy. Final diagnosis was invasive ductal carcinoma, HER-2 negative, ER positive, Ki-67 19%, pathologic stage TI B., N0.  She had some erythema of her breast before she started radiation therapy and we put her on doxycycline and she got somewhat  better. She went ahead with radiation therapy and that went well. I saw her  on April 9 and she looked fine.  She saw Dr.Rubin on May 30 and there was no comment about abnormal breast.   Dr. Carman Ching saw herjune 5 and he noted some erythema of the incisions, but no drainage and perhaps just a little bit of puffiness and minimal pain. He put her on  Doxycycline empirically  She has gotten a little bit better but there are still some red spots on the incisions. She has good range of motion of her shoulder. No fever or chills. No real pain.   She is tolerating her arrimidex  And otherwise is doing well.  Review of Systems     Objective:   Physical Exam Patient is alert. In good spirits. No distress. Her husband is with her.  Examination of the right breast and axilla and right arm showed only some intermittent superficial red spots limited to the incisions. The rest of the breast and axillary skin looks normal.. These are not raised. They are not tender. There is no drainage. There is no fluctuance. There is no palpable fluid collection. There is no evidence of hematoma, abscess, or cellulitis. This is not tender.  Right arm feels fine. No swelling. Good range of motion.    Assessment:     Low-grade, skin rash of right axillary and right breast incisions. I wonder if this is some form of dermatologic reaction, perhaps to suture material. It does not look like abscess or  cellulitis.  Invasive ductal carcinoma right breast, pathologic stage TI B., N0, receptor positive, HER-2-negative, Ki-67 19%  Status post  right partial mastectomy and sentinel biopsy September 14, 2011.  Status post adjuvant radiation therapy and at currently on adjuvant arimadex.    Plan:     Discontinue antibiotics.  No further intervention unless this skin rash flares up. If that occurs I would refer her back to Dr. Danella Deis, her dermatologist.  Return to see me if she develops pain or fever or diffuse redness  Otherwise see me in 6 months after she gets her annual mammograms.   [name]. Derrell Lolling, M.D., [facility] General and Minimally invasive Surgery Breast and Colorectal Surgery Office:   [phone] Pager:   [phone]

## 2012-01-27 ENCOUNTER — Ambulatory Visit: Payer: Medicare Other | Admitting: Physician Assistant

## 2012-01-27 ENCOUNTER — Other Ambulatory Visit: Payer: Medicare Other | Admitting: Lab

## 2012-03-05 ENCOUNTER — Encounter: Payer: Self-pay | Admitting: Dietician

## 2012-03-05 NOTE — Progress Notes (Signed)
Brief Out-patient Oncology Nutrition Note  Reason: Patient attended breast cancer nutrition class.   I have educated the patient on plant-based diet. I also discussed and provided nutrition handouts and research based evidence on breast cancer nutrition. We discussed nutrition management for symptoms associated with treatment. The class lasted a duration of 1 hour. The patient's asked good questions and were without any further nutrition related questions or concerns. RD contact information was provided. Patient's were instructed to contact RD for future nutrition questions or concerns.   RD available for nutrition needs.   Jamie Cole [phone] 

## 2012-04-16 ENCOUNTER — Encounter (HOSPITAL_BASED_OUTPATIENT_CLINIC_OR_DEPARTMENT_OTHER): Payer: Self-pay | Admitting: *Deleted

## 2012-04-16 ENCOUNTER — Emergency Department (HOSPITAL_BASED_OUTPATIENT_CLINIC_OR_DEPARTMENT_OTHER): Payer: Medicare Other

## 2012-04-16 ENCOUNTER — Emergency Department (HOSPITAL_BASED_OUTPATIENT_CLINIC_OR_DEPARTMENT_OTHER)
Admission: EM | Admit: 2012-04-16 | Discharge: 2012-04-16 | Disposition: A | Discharge status: Home / Self Care | Payer: Medicare Other | Attending: Emergency Medicine | Admitting: Emergency Medicine

## 2012-04-16 DIAGNOSIS — R51 Headache: Secondary | ICD-10-CM | POA: Diagnosis not present

## 2012-04-16 DIAGNOSIS — Z853 Personal history of malignant neoplasm of breast: Secondary | ICD-10-CM | POA: Insufficient documentation

## 2012-04-16 DIAGNOSIS — Y93B9 Activity, other involving muscle strengthening exercises: Secondary | ICD-10-CM | POA: Insufficient documentation

## 2012-04-16 DIAGNOSIS — S0990XA Unspecified injury of head, initial encounter: Secondary | ICD-10-CM | POA: Insufficient documentation

## 2012-04-16 DIAGNOSIS — Z79899 Other long term (current) drug therapy: Secondary | ICD-10-CM | POA: Diagnosis not present

## 2012-04-16 DIAGNOSIS — Z043 Encounter for examination and observation following other accident: Secondary | ICD-10-CM | POA: Diagnosis not present

## 2012-04-16 DIAGNOSIS — E039 Hypothyroidism, unspecified: Secondary | ICD-10-CM | POA: Insufficient documentation

## 2012-04-16 DIAGNOSIS — W1789XA Other fall from one level to another, initial encounter: Secondary | ICD-10-CM | POA: Insufficient documentation

## 2012-04-16 NOTE — ED Notes (Signed)
Rolled off an exercise ball and hit her head. No loc.

## 2012-04-16 NOTE — ED Provider Notes (Signed)
History     CSN: [id number]  Arrival date & time 04/16/12  1305   First MD Initiated Contact with Patient 04/16/12 1334      Chief Complaint  Patient presents with  . Fall    (Consider location/radiation/quality/duration/timing/severity/associated sxs/prior treatment) HPI Comments: Patient was at workout class when she fell off of an exercise ball and struck head on the mirror.  There was no loc.  She has a mild headache but no visual complaints.  She denies neck pain.  Patient is a 71 y.o. female presenting with head injury. The history is provided by the patient.  Head Injury  The incident occurred less than 1 hour ago. She came to the ER via walk-in. The injury mechanism was a direct blow and a fall. There was no loss of consciousness. There was no blood loss. The quality of the pain is described as dull. The pain is mild. The pain has been constant since the injury. Pertinent negatives include no numbness, no blurred vision and no vomiting. She was found conscious by EMS personnel.    Past Medical History  Diagnosis Date  . Endometrial hyperplasia, simple   . Endometrial polyp   . Wears glasses   . Hypothyroidism   . Complication of anesthesia     hard to wake up  . Skin cancer     no additional information known  . Breast cancer 08/31/11    DCIS; right breast, ER/PR +  . Breast CA   . Hx of radiation therapy 10/18/11 to 11/18/11    right breast  . Hx of radiation therapy 1957    4 tx to face for acne    Past Surgical History  Procedure Date  . Endometrial polyp   . Tonsillectomy   . Colonoscopy   . Mastectomy, partial 09/14/11    right breast    Family History  Problem Relation Age of Onset  . Skin cancer Father 34  . Breast cancer Paternal Aunt     x 2  . Stomach cancer Paternal Grandmother 81  . Breast cancer Cousin     ages 7-60 at dx x 4 paternal  . Prostate cancer Cousin     ????  . Brain cancer Maternal Uncle     brain  . Cancer Cousin    tonsillar  . Colon cancer Neg Hx   . Stroke Father   . Stroke Brother     History  Substance Use Topics  . Smoking status: Never Smoker   . Smokeless tobacco: Never Used  . Alcohol Use: No    OB History    Grav Para Term Preterm Abortions TAB SAB Ect Mult Living   1 1             Obstetric Comments   Menarche age 9, menopause 34, HRT x 2-3 yrs      Review of Systems  Eyes: Negative for blurred vision.  Gastrointestinal: Negative for vomiting.  Neurological: Negative for numbness.  All other systems reviewed and are negative.    Allergies  Other  Home Medications   Current Outpatient Rx  Name Route Sig Dispense Refill  . ANASTROZOLE 1 MG PO TABS      . VITAMIN C 1000 MG PO TABS Oral Take 1,000 mg by mouth daily.    . ASPIRIN EC 81 MG PO TBEC Oral Take 81 mg by mouth daily.    Marland Kitchen GRAPE SEED PO Oral Take by mouth daily.    Marland Kitchen  CALCIUM CITRATE-VITAMIN D 315-200 MG-UNIT PO TABS Oral Take 1 tablet by mouth 2 (two) times daily.    . OMEGA-3 FATTY ACIDS 1000 MG PO CAPS Oral Take 1 g by mouth daily.    Marland Kitchen FLAX SEEDS PO Oral Take 1,000 mg by mouth daily.    Marland Kitchen GLUCOSAMINE-CHONDROITIN 500-400 MG PO TABS Oral Take 1 tablet by mouth 3 (three) times daily.    Marland Kitchen HYDROCORTISONE ACETATE 25 MG RE SUPP Rectal Place 1 suppository (25 mg total) rectally every 12 (twelve) hours. 12 suppository 1  . HYDROCORTISONE ACE-PRAMOXINE 2.5-1 % RE CREA      . LEVOTHYROXINE SODIUM 112 MCG PO TABS Oral Take 112 mcg by mouth daily.    Marland Kitchen MAGNESIUM-POTASSIUM PO Oral Take 99 mg by mouth daily.    Marland Kitchen CITRUCEL PO Oral Take 4 capsules by mouth daily.    Marland Kitchen PRAMOXINE-HC 1-2.5 % EX CREA  Apply to affected area 2 times daily. 30 g 1  . RESVERATROL PO Oral Take 500 mg by mouth daily.    Marland Kitchen VITAMIN D (ERGOCALCIFEROL) 50000 UNITS PO CAPS Oral Take 50,000 Units by mouth every 7 (seven) days.       BP 147/69  Pulse 80  Temp 98 F (36.7 C) (Oral)  Resp 18  SpO2 99%  Physical Exam  Nursing note and vitals  reviewed. Constitutional: She is oriented to person, place, and time. She appears well-developed and well-nourished. No distress.  HENT:  Head: Normocephalic.  Right Ear: External ear normal.  Mouth/Throat: Oropharynx is clear and moist.       There is soft tissue swelling over the left parietal region without laceration or bleeding.  Eyes: EOM are normal. Pupils are equal, round, and reactive to light.  Neck: Normal range of motion. Neck supple.  Cardiovascular: Normal rate and regular rhythm.   Pulmonary/Chest: Effort normal and breath sounds normal.  Abdominal: Soft. Bowel sounds are normal.  Neurological: She is alert and oriented to person, place, and time. No cranial nerve deficit. She exhibits normal muscle tone. Coordination normal.  Skin: Skin is warm and dry. She is not diaphoretic.    ED Course  Procedures (including critical care time)  Labs Reviewed - No data to display No results found.   No diagnosis found.    MDM  The patient presents here for evaluation of head injury.  The ct looks okay and she is neurologically intact.  She is stable for discharge.  To return prn for any problems.        Geoffery Lyons, MD 04/16/12 [id number]

## 2012-05-04 DIAGNOSIS — E669 Obesity, unspecified: Secondary | ICD-10-CM | POA: Diagnosis not present

## 2012-05-04 DIAGNOSIS — Z23 Encounter for immunization: Secondary | ICD-10-CM | POA: Diagnosis not present

## 2012-05-04 DIAGNOSIS — M899 Disorder of bone, unspecified: Secondary | ICD-10-CM | POA: Diagnosis not present

## 2012-05-04 DIAGNOSIS — R7309 Other abnormal glucose: Secondary | ICD-10-CM | POA: Diagnosis not present

## 2012-05-04 DIAGNOSIS — E039 Hypothyroidism, unspecified: Secondary | ICD-10-CM | POA: Diagnosis not present

## 2012-05-22 ENCOUNTER — Telehealth (INDEPENDENT_AMBULATORY_CARE_PROVIDER_SITE_OTHER): Payer: Self-pay | Admitting: General Surgery

## 2012-05-22 DIAGNOSIS — D239 Other benign neoplasm of skin, unspecified: Secondary | ICD-10-CM | POA: Diagnosis not present

## 2012-05-22 DIAGNOSIS — L719 Rosacea, unspecified: Secondary | ICD-10-CM | POA: Diagnosis not present

## 2012-05-22 DIAGNOSIS — L821 Other seborrheic keratosis: Secondary | ICD-10-CM | POA: Diagnosis not present

## 2012-05-22 DIAGNOSIS — Z85828 Personal history of other malignant neoplasm of skin: Secondary | ICD-10-CM | POA: Diagnosis not present

## 2012-05-22 NOTE — Telephone Encounter (Signed)
Called to speak with patient regarding setting up appointment to see Dr. Derrell Lolling in December. Was advised patient would be back home in about an hour. Advised I would call back between 1:00 and 1:15 to talk to patient.  Per Marianne Sofia, patient was advised by our office that she could not get an appointment until February. Reply sent back to [facility] to advise the patient will be set up in December per Dr. Lady Deutscher notation.

## 2012-05-22 NOTE — Telephone Encounter (Signed)
Called patient who stated she was not able to make appointment during lov due to schedule not being available yet. Patient to have been placed on recall list for a December appointment. Advised patient of days available in December week of 07/16/12. Patient agreed to appointment to be set up.

## 2012-05-31 ENCOUNTER — Ambulatory Visit
Admission: RE | Admit: 2012-05-31 | Discharge: 2012-05-31 | Disposition: A | Discharge status: Home / Self Care | Payer: Medicare Other | Source: Ambulatory Visit | Attending: Oncology | Admitting: Oncology

## 2012-05-31 ENCOUNTER — Other Ambulatory Visit: Payer: Self-pay | Admitting: Oncology

## 2012-05-31 DIAGNOSIS — C50419 Malignant neoplasm of upper-outer quadrant of unspecified female breast: Secondary | ICD-10-CM

## 2012-05-31 DIAGNOSIS — Z853 Personal history of malignant neoplasm of breast: Secondary | ICD-10-CM | POA: Diagnosis not present

## 2012-06-25 ENCOUNTER — Other Ambulatory Visit (HOSPITAL_BASED_OUTPATIENT_CLINIC_OR_DEPARTMENT_OTHER): Payer: Medicare Other | Admitting: Lab

## 2012-06-25 ENCOUNTER — Other Ambulatory Visit: Payer: Self-pay | Admitting: *Deleted

## 2012-06-25 DIAGNOSIS — C50419 Malignant neoplasm of upper-outer quadrant of unspecified female breast: Secondary | ICD-10-CM

## 2012-06-25 DIAGNOSIS — C9210 Chronic myeloid leukemia, BCR/ABL-positive, not having achieved remission: Secondary | ICD-10-CM | POA: Diagnosis not present

## 2012-06-25 DIAGNOSIS — E559 Vitamin D deficiency, unspecified: Secondary | ICD-10-CM | POA: Diagnosis not present

## 2012-06-25 LAB — CANCER ANTIGEN 27.29: CA 27.29: 18 U/mL (ref 0–39)

## 2012-06-25 LAB — CBC WITH DIFFERENTIAL/PLATELET
BASO%: 2.3 % — ABNORMAL HIGH (ref 0.0–2.0)
HCT: 42.3 % (ref 34.8–46.6)
LYMPH%: 31 % (ref 14.0–49.7)
MCHC: 33.2 g/dL (ref 31.5–36.0)
MCV: 95.2 fL (ref 79.5–101.0)
MONO#: 0.6 10*3/uL (ref 0.1–0.9)
MONO%: 9.9 % (ref 0.0–14.0)
NEUT%: 53.2 % (ref 38.4–76.8)
Platelets: 228 10*3/uL (ref 145–400)
RBC: 4.45 10*6/uL (ref 3.70–5.45)

## 2012-06-25 LAB — MORPHOLOGY: RBC Comments: NORMAL

## 2012-06-25 LAB — COMPREHENSIVE METABOLIC PANEL (CC13)
Albumin: 4 g/dL (ref 3.5–5.0)
Alkaline Phosphatase: 95 U/L (ref 40–150)
BUN: 13 mg/dL (ref 7.0–26.0)
CO2: 27 mEq/L (ref 22–29)
Glucose: 91 mg/dl (ref 70–99)
Potassium: 4.1 mEq/L (ref 3.5–5.1)
Sodium: 142 mEq/L (ref 136–145)
Total Bilirubin: 0.43 mg/dL (ref 0.20–1.20)
Total Protein: 6.4 g/dL (ref 6.4–8.3)

## 2012-06-26 ENCOUNTER — Other Ambulatory Visit: Payer: Medicare Other | Admitting: Lab

## 2012-06-26 LAB — VITAMIN D 25 HYDROXY (VIT D DEFICIENCY, FRACTURES): Vit D, 25-Hydroxy: 61 ng/mL (ref 30–89)

## 2012-07-03 ENCOUNTER — Other Ambulatory Visit: Payer: Medicare Other | Admitting: Lab

## 2012-07-03 ENCOUNTER — Ambulatory Visit: Payer: Medicare Other | Admitting: Oncology

## 2012-07-05 ENCOUNTER — Telehealth: Payer: Self-pay | Admitting: *Deleted

## 2012-07-05 ENCOUNTER — Encounter: Payer: Self-pay | Admitting: *Deleted

## 2012-07-05 ENCOUNTER — Ambulatory Visit (HOSPITAL_BASED_OUTPATIENT_CLINIC_OR_DEPARTMENT_OTHER): Payer: Medicare Other | Admitting: Oncology

## 2012-07-05 VITALS — BP 115/61 | HR 73 | Temp 97.4°F | Resp 20 | Ht 64.0 in | Wt 197.2 lb

## 2012-07-05 DIAGNOSIS — Z170 Estrogen receptor positive status [ER+]: Secondary | ICD-10-CM

## 2012-07-05 DIAGNOSIS — M8580 Other specified disorders of bone density and structure, unspecified site: Secondary | ICD-10-CM

## 2012-07-05 DIAGNOSIS — L910 Hypertrophic scar: Secondary | ICD-10-CM

## 2012-07-05 DIAGNOSIS — C50419 Malignant neoplasm of upper-outer quadrant of unspecified female breast: Secondary | ICD-10-CM

## 2012-07-05 NOTE — Progress Notes (Signed)
Hematology and Oncology Follow Up Visit  Jamie Cole [id number] Jun 02, 1941 71 y.o. 07/05/2012 11:43 AM PCP  Principle Diagnosis: 71 yo  woman with history of breast cancer, er+ T1b, N0   Interim History: lumpectomy 09/14/11; xrt completed 11/18/11 on arimidex, she tolerates it well. Last rt mammogram 10/13- wnl, a f/u is scheduled for 1/14. A bone density is currently due. She is also on vitamin d replacement.  Medications: I have reviewed the patient's current medications.  Allergies:  Allergies  Allergen Reactions  . Other     PLEASE DO BLOOD PRESSURE IN LEFT ARM ONLY    Past Medical History, Surgical history, Social history, and Family History were reviewed and updated.  Review of Systems: Constitutional:  Negative for fever, chills, night sweats, anorexia, weight loss, pain. Cardiovascular: no chest pain or dyspnea on exertion Respiratory: negative Neurological: negative Dermatological: negative ENT: negative Skin has noted some keloid formatioj on breast Gastrointestinal: negative Genito-Urinary: negative Hematological and Lymphatic: negative Breast: negative Musculoskeletal: negative Remaining ROS negative.  Physical Exam: Blood pressure 115/61, pulse 73, temperature 97.4 F (36.3 C), resp. rate 20, height 5\' 4"  (1.626 m), weight 197 lb 3.2 oz (89.449 kg). ECOG: 0 Oropharynx- nl Chest-clear, no rales or rhonchi Heart- nlheart sounds Abdomen- no organomegaly Breasts- wnl, keloid on scars, no adenopathy Abdomen- wnl Ext- wnl    Lab Results: Lab Results  Component Value Date   WBC 5.9 06/25/2012   HGB 14.1 06/25/2012   HCT 42.3 06/25/2012   MCV 95.2 06/25/2012   PLT 228 06/25/2012     Chemistry      Component Value Date/Time   NA 142 06/25/2012 1323   NA 140 10/04/2011 2159   K 4.1 06/25/2012 1323   K 4.0 10/04/2011 2159   CL 107 06/25/2012 1323   CL 105 10/04/2011 2159   CO2 27 06/25/2012 1323   CO2 26 10/04/2011 2159   BUN 13.0 06/25/2012 1323   BUN 21 10/04/2011 2159   CREATININE 0.7 06/25/2012 1323   CREATININE 0.60 10/04/2011 2159      Component Value Date/Time   CALCIUM 9.4 06/25/2012 1323   CALCIUM 9.4 10/04/2011 2159   ALKPHOS 95 06/25/2012 1323   ALKPHOS 102 10/04/2011 2159   AST 19 06/25/2012 1323   AST 14 10/04/2011 2159   ALT 16 06/25/2012 1323   ALT 11 10/04/2011 2159   BILITOT 0.43 06/25/2012 1323   BILITOT 0.1* 10/04/2011 2159      .pathology. Radiological Studies: chest X-ray n/a Mammogram n/a Bone density n/a  Impression and Plan: 71 year old woman with history of ER/PR positive grade 1 breast cancer T1bN 0, She has completed xrt and is on arimidex which she tolerates.  I have recommended she see a dermatologist, re the keloid areas.  I will see her in 6  Months, and will get a mammogram in October. Her bone density will be scheduled for 12/13. More than 50% of the visit was spent in patient-related counselling   Pierce Crane, MD 12/5/201311:43 AM

## 2012-07-05 NOTE — Telephone Encounter (Signed)
Add on lab only one week before the md appointment

## 2012-07-16 ENCOUNTER — Encounter (INDEPENDENT_AMBULATORY_CARE_PROVIDER_SITE_OTHER): Payer: Self-pay | Admitting: General Surgery

## 2012-07-16 ENCOUNTER — Ambulatory Visit (INDEPENDENT_AMBULATORY_CARE_PROVIDER_SITE_OTHER): Payer: Medicare Other | Admitting: General Surgery

## 2012-07-16 VITALS — BP 126/78 | HR 72 | Temp 98.4°F | Resp 18 | Ht 64.0 in | Wt 197.1 lb

## 2012-07-16 DIAGNOSIS — C50419 Malignant neoplasm of upper-outer quadrant of unspecified female breast: Secondary | ICD-10-CM | POA: Diagnosis not present

## 2012-07-16 NOTE — Patient Instructions (Signed)
Examination of your right breast, left breast, and lymph node areas is normal. There is no evidence of cancer.  Be sure to get the bilateral mammograms that are scheduled in January of 2014.  Stay on the arimidex.  Keep your appointments at the [facility]  Return to see me in 8 months.

## 2012-07-16 NOTE — Progress Notes (Signed)
Patient ID: Jamie Cole, female   DOB: 04-24-1941, 71 y.o.   MRN: [id number]  Chief Complaint  Patient presents with  . Routine Post Op    HPI Jamie Cole is a 71 y.o. female.  She returns for long-term followup regarding her right breast cancer.  On 09/14/2011 she had a right partial mastectomy and sentinel node biopsy. She had invasive ductal carcinoma, ER-positive, HER-2-negative, pathologic stage T1b., N0.  She had a erythematous rash in the right breast that we were never certain about.   It got better.   She saw Dr. Danella Deis, her dermatologist and no further recommendations were made.   Ultrasound and mammogram of the right breast on 05/31/2012 which is category 2, looks good. She has completed her radiation therapy. She is on arimidex. She says she is scheduled for bilateral mammograms in January 2014 for her annual checkup. HPI  Past Medical History  Diagnosis Date  . Endometrial hyperplasia, simple   . Endometrial polyp   . Wears glasses   . Hypothyroidism   . Complication of anesthesia     hard to wake up  . Skin cancer     no additional information known  . Breast cancer 08/31/11    DCIS; right breast, ER/PR +  . Breast CA   . Hx of radiation therapy 10/18/11 to 11/18/11    right breast  . Hx of radiation therapy 1957    4 tx to face for acne    Past Surgical History  Procedure Date  . Endometrial polyp   . Tonsillectomy   . Colonoscopy   . Mastectomy, partial 09/14/11    right breast    Family History  Problem Relation Age of Onset  . Skin cancer Father 79  . Breast cancer Paternal Aunt     x 2  . Stomach cancer Paternal Grandmother 53  . Breast cancer Cousin     ages 36-60 at dx x 4 paternal  . Prostate cancer Cousin     ????  . Brain cancer Maternal Uncle     brain  . Cancer Cousin     tonsillar  . Colon cancer Neg Hx   . Stroke Father   . Stroke Brother     Social History History  Substance Use Topics  . Smoking status: Never Smoker    . Smokeless tobacco: Never Used  . Alcohol Use: No    Allergies  Allergen Reactions  . Other     PLEASE DO BLOOD PRESSURE IN LEFT ARM ONLY    Current Outpatient Prescriptions  Medication Sig Dispense Refill  . anastrozole (ARIMIDEX) 1 MG tablet       . Ascorbic Acid (VITAMIN C) 1000 MG tablet Take 1,000 mg by mouth daily.      . calcium citrate-vitamin D (CITRACAL+D) 315-200 MG-UNIT per tablet Take 1 tablet by mouth 2 (two) times daily.      Marland Kitchen levothyroxine (SYNTHROID, LEVOTHROID) 112 MCG tablet Take 112 mcg by mouth daily.      . Vitamin D, Ergocalciferol, (DRISDOL) 50000 UNITS CAPS Take 50,000 Units by mouth every 7 (seven) days.         Review of Systems Review of Systems  Constitutional: Negative for fever, chills and unexpected weight change.  HENT: Negative for hearing loss, congestion, sore throat, trouble swallowing and voice change.   Eyes: Negative for visual disturbance.  Respiratory: Negative for cough and wheezing.   Cardiovascular: Negative for chest pain, palpitations and leg swelling.  Gastrointestinal: Negative for nausea, vomiting, abdominal pain, diarrhea, constipation, blood in stool, abdominal distention and anal bleeding.  Genitourinary: Negative for hematuria, vaginal bleeding and difficulty urinating.  Musculoskeletal: Negative for arthralgias.  Skin: Negative for rash and wound.  Neurological: Negative for seizures, syncope and headaches.  Hematological: Negative for adenopathy. Does not bruise/bleed easily.  Psychiatric/Behavioral: Negative for confusion.    Blood pressure 126/78, pulse 72, temperature 98.4 F (36.9 C), temperature source Temporal, resp. rate 18, height 5\' 4"  (1.626 m), weight 197 lb 2 oz (89.415 kg).  Physical Exam Physical Exam  Constitutional: She is oriented to person, place, and time. She appears well-developed and well-nourished. No distress.  HENT:  Head: Normocephalic and atraumatic.  Eyes: Conjunctivae normal and EOM are  normal. Pupils are equal, round, and reactive to light.  Neck: Neck supple. No JVD present. No tracheal deviation present. No thyromegaly present.  Cardiovascular: Normal rate, regular rhythm, normal heart sounds and intact distal pulses.   No murmur heard. Pulmonary/Chest: Effort normal and breath sounds normal. No respiratory distress. She has no wheezes. She has no rales. She exhibits no tenderness.       Right breast shows a well-healed lateral scar and well-healed axillary scar. Tissues are soft. No palpable mass in either breast. No axillary adenopathy. No other skin changes.  Abdominal: Soft.  Lymphadenopathy:    She has no cervical adenopathy.  Neurological: She is alert and oriented to person, place, and time. She exhibits normal muscle tone. Coordination normal.  Skin: Skin is warm. No rash noted. She is not diaphoretic. No erythema. No pallor.  Psychiatric: She has a normal mood and affect. Her behavior is normal. Judgment and thought content normal.    Data Reviewed Mammograms and ultrasound from October. Cancer Center notes. My records.  Assessment    Invasive ductal carcinoma right breast, receptor positive, HER-2-negative. Pathologic stage TI B., N0.  No evidence of recurrence 10 months following right partial mastectomy and sentinel node biopsy    Plan    Bilateral mammograms January 2014  Continue Arimidex  Keep appointments at cancer Center with medical oncology  Return to see me in 8 months.       [name]. Derrell Lolling, M.D., [facility] General and Minimally invasive Surgery Breast and Colorectal Surgery Office:   [phone] Pager:   [phone]  07/16/2012, 10:28 AM

## 2012-08-02 ENCOUNTER — Other Ambulatory Visit: Payer: Self-pay | Admitting: Obstetrics & Gynecology

## 2012-08-02 DIAGNOSIS — Z853 Personal history of malignant neoplasm of breast: Secondary | ICD-10-CM

## 2012-08-09 ENCOUNTER — Ambulatory Visit
Admission: RE | Admit: 2012-08-09 | Discharge: 2012-08-09 | Disposition: A | Discharge status: Home / Self Care | Payer: Medicare Other | Source: Ambulatory Visit | Attending: Oncology | Admitting: Oncology

## 2012-08-09 DIAGNOSIS — M8580 Other specified disorders of bone density and structure, unspecified site: Secondary | ICD-10-CM

## 2012-08-09 DIAGNOSIS — M899 Disorder of bone, unspecified: Secondary | ICD-10-CM | POA: Diagnosis not present

## 2012-08-09 DIAGNOSIS — C50419 Malignant neoplasm of upper-outer quadrant of unspecified female breast: Secondary | ICD-10-CM

## 2012-08-09 DIAGNOSIS — M949 Disorder of cartilage, unspecified: Secondary | ICD-10-CM | POA: Diagnosis not present

## 2012-08-15 ENCOUNTER — Ambulatory Visit
Admission: RE | Admit: 2012-08-15 | Discharge: 2012-08-15 | Disposition: A | Discharge status: Home / Self Care | Payer: Medicare Other | Source: Ambulatory Visit | Attending: Obstetrics & Gynecology | Admitting: Obstetrics & Gynecology

## 2012-08-15 DIAGNOSIS — Z853 Personal history of malignant neoplasm of breast: Secondary | ICD-10-CM | POA: Diagnosis not present

## 2012-08-16 ENCOUNTER — Telehealth: Payer: Self-pay | Admitting: *Deleted

## 2012-08-16 NOTE — Telephone Encounter (Signed)
Pt called stating that she would like to transfer to Dr. Darnelle Catalan since she found out that Dr. Donnie Coffin has left the practice.  Informed pt that we will schedule her with Dr. Darnelle Catalan when she was due to f/u with Dr. Donnie Coffin. Received verbal understanding.

## 2012-08-30 ENCOUNTER — Emergency Department (HOSPITAL_BASED_OUTPATIENT_CLINIC_OR_DEPARTMENT_OTHER): Payer: Medicare Other

## 2012-08-30 ENCOUNTER — Encounter (HOSPITAL_BASED_OUTPATIENT_CLINIC_OR_DEPARTMENT_OTHER): Payer: Self-pay

## 2012-08-30 ENCOUNTER — Emergency Department (HOSPITAL_BASED_OUTPATIENT_CLINIC_OR_DEPARTMENT_OTHER)
Admission: EM | Admit: 2012-08-30 | Discharge: 2012-08-30 | Disposition: A | Discharge status: Home / Self Care | Payer: Medicare Other | Attending: Emergency Medicine | Admitting: Emergency Medicine

## 2012-08-30 DIAGNOSIS — IMO0002 Reserved for concepts with insufficient information to code with codable children: Secondary | ICD-10-CM | POA: Insufficient documentation

## 2012-08-30 DIAGNOSIS — Z8742 Personal history of other diseases of the female genital tract: Secondary | ICD-10-CM | POA: Diagnosis not present

## 2012-08-30 DIAGNOSIS — Z79899 Other long term (current) drug therapy: Secondary | ICD-10-CM | POA: Insufficient documentation

## 2012-08-30 DIAGNOSIS — S62308A Unspecified fracture of other metacarpal bone, initial encounter for closed fracture: Secondary | ICD-10-CM

## 2012-08-30 DIAGNOSIS — Z923 Personal history of irradiation: Secondary | ICD-10-CM | POA: Insufficient documentation

## 2012-08-30 DIAGNOSIS — Z8669 Personal history of other diseases of the nervous system and sense organs: Secondary | ICD-10-CM | POA: Insufficient documentation

## 2012-08-30 DIAGNOSIS — S62309A Unspecified fracture of unspecified metacarpal bone, initial encounter for closed fracture: Secondary | ICD-10-CM

## 2012-08-30 DIAGNOSIS — Z85828 Personal history of other malignant neoplasm of skin: Secondary | ICD-10-CM | POA: Insufficient documentation

## 2012-08-30 DIAGNOSIS — E039 Hypothyroidism, unspecified: Secondary | ICD-10-CM | POA: Diagnosis not present

## 2012-08-30 DIAGNOSIS — S62319A Displaced fracture of base of unspecified metacarpal bone, initial encounter for closed fracture: Secondary | ICD-10-CM | POA: Diagnosis not present

## 2012-08-30 DIAGNOSIS — Z853 Personal history of malignant neoplasm of breast: Secondary | ICD-10-CM | POA: Insufficient documentation

## 2012-08-30 DIAGNOSIS — Y929 Unspecified place or not applicable: Secondary | ICD-10-CM | POA: Insufficient documentation

## 2012-08-30 DIAGNOSIS — W010XXA Fall on same level from slipping, tripping and stumbling without subsequent striking against object, initial encounter: Secondary | ICD-10-CM | POA: Insufficient documentation

## 2012-08-30 DIAGNOSIS — Y9389 Activity, other specified: Secondary | ICD-10-CM | POA: Insufficient documentation

## 2012-08-30 HISTORY — DX: Unspecified fracture of unspecified metacarpal bone, initial encounter for closed fracture: S62.309A

## 2012-08-30 MED ORDER — HYDROCODONE-ACETAMINOPHEN 5-325 MG PO TABS: 1.0000 | ORAL_TABLET | ORAL | Status: DC | PRN

## 2012-08-30 NOTE — ED Provider Notes (Signed)
History     CSN: [id number]  Arrival date & time 08/30/12  9147   First MD Initiated Contact with Patient 08/30/12 1838      Chief Complaint  Patient presents with  . Hand Injury    (Consider location/radiation/quality/duration/timing/severity/associated sxs/prior treatment) HPI Comments: Patient comes to the ER for evaluation of right hand injury. Patient reports that she slipped on the ice trying to get the mail. She landed on her right hand. She is having pain at the base of the fourth and fifth fingers of the hand. Pain is mild to moderate but worsens with movement. There was no head injury or loss of consciousness. Patient denies neck and back pain.  Patient is a 72 y.o. female presenting with hand injury.  Hand Injury     Past Medical History  Diagnosis Date  . Endometrial hyperplasia, simple   . Endometrial polyp   . Wears glasses   . Hypothyroidism   . Complication of anesthesia     hard to wake up  . Skin cancer     no additional information known  . Breast cancer 08/31/11    DCIS; right breast, ER/PR +  . Breast CA   . Hx of radiation therapy 10/18/11 to 11/18/11    right breast  . Hx of radiation therapy 1957    4 tx to face for acne    Past Surgical History  Procedure Date  . Endometrial polyp   . Tonsillectomy   . Colonoscopy   . Mastectomy, partial 09/14/11    right breast    Family History  Problem Relation Age of Onset  . Skin cancer Father 11  . Breast cancer Paternal Aunt     x 2  . Stomach cancer Paternal Grandmother 15  . Breast cancer Cousin     ages 8-60 at dx x 4 paternal  . Prostate cancer Cousin     ????  . Brain cancer Maternal Uncle     brain  . Cancer Cousin     tonsillar  . Colon cancer Neg Hx   . Stroke Father   . Stroke Brother     History  Substance Use Topics  . Smoking status: Never Smoker   . Smokeless tobacco: Never Used  . Alcohol Use: No    OB History    Grav Para Term Preterm Abortions TAB SAB Ect Mult  Living   1 1             Obstetric Comments   Menarche age 64, menopause 62, HRT x 2-3 yrs      Review of Systems  Musculoskeletal:       Hand pain  Skin: Positive for wound.    Allergies  Other  Home Medications   Current Outpatient Rx  Name  Route  Sig  Dispense  Refill  . ANASTROZOLE 1 MG PO TABS               . VITAMIN C 1000 MG PO TABS   Oral   Take 1,000 mg by mouth daily.         Marland Kitchen CALCIUM CITRATE-VITAMIN D 315-200 MG-UNIT PO TABS   Oral   Take 1 tablet by mouth 2 (two) times daily.         Marland Kitchen LEVOTHYROXINE SODIUM 112 MCG PO TABS   Oral   Take 112 mcg by mouth daily.         Marland Kitchen VITAMIN D (ERGOCALCIFEROL) 50000 UNITS PO CAPS  Oral   Take 50,000 Units by mouth every 7 (seven) days.            BP 159/69  Pulse 84  Temp 97.9 F (36.6 C) (Oral)  Resp 16  Wt 197 lb (89.359 kg)  SpO2 99%  Physical Exam  Constitutional: She appears well-developed.  Eyes: Pupils are equal, round, and reactive to light.  Neck: Normal range of motion. Neck supple.  Cardiovascular: Normal rate and regular rhythm.   Pulmonary/Chest: Effort normal and breath sounds normal.  Abdominal: Soft.  Musculoskeletal: Normal range of motion.       Right hand: She exhibits tenderness. She exhibits normal range of motion and no deformity.       Hands: Skin: Abrasion noted.    ED Course  Procedures (including critical care time)  Labs Reviewed - No data to display No results found.   Diagnoses: Fifth metacarpal fracture right hand    MDM  This presents to the ER with complaints of hand pain after a fall. Patient has pain over the lateral aspect of the right hand. There is an abrasion over the fifth MCP joint region on the volar aspect of the hand. Is no significant swelling or bruising. X-ray of the hand, however, does reveal slightly angulated nondisplaced fracture at the base of the fifth metacarpal. Patient placed in an ulnar gutter splint and will followup with  orthopedics.        Gilda Crease, MD 08/30/12 Ebony Cargo

## 2012-08-30 NOTE — ED Notes (Signed)
Injury to right hand after a fall.

## 2012-08-31 DIAGNOSIS — S62309A Unspecified fracture of unspecified metacarpal bone, initial encounter for closed fracture: Secondary | ICD-10-CM | POA: Diagnosis not present

## 2012-09-05 ENCOUNTER — Encounter (INDEPENDENT_AMBULATORY_CARE_PROVIDER_SITE_OTHER): Payer: Self-pay | Admitting: General Surgery

## 2012-09-24 DIAGNOSIS — J029 Acute pharyngitis, unspecified: Secondary | ICD-10-CM | POA: Diagnosis not present

## 2012-09-29 ENCOUNTER — Other Ambulatory Visit: Payer: Self-pay | Admitting: Oncology

## 2012-09-29 DIAGNOSIS — C50911 Malignant neoplasm of unspecified site of right female breast: Secondary | ICD-10-CM

## 2012-10-16 DIAGNOSIS — S62309A Unspecified fracture of unspecified metacarpal bone, initial encounter for closed fracture: Secondary | ICD-10-CM | POA: Diagnosis not present

## 2012-10-19 DIAGNOSIS — S62309A Unspecified fracture of unspecified metacarpal bone, initial encounter for closed fracture: Secondary | ICD-10-CM | POA: Diagnosis not present

## 2012-10-20 ENCOUNTER — Telehealth: Payer: Self-pay | Admitting: Oncology

## 2012-10-20 ENCOUNTER — Encounter: Payer: Self-pay | Admitting: Oncology

## 2012-10-22 DIAGNOSIS — S62309A Unspecified fracture of unspecified metacarpal bone, initial encounter for closed fracture: Secondary | ICD-10-CM | POA: Diagnosis not present

## 2012-10-24 ENCOUNTER — Telehealth: Payer: Self-pay | Admitting: *Deleted

## 2012-10-24 NOTE — Telephone Encounter (Signed)
Jamie W. walked over and gave me a note stating that the pt received her letter & calendar in the mail and is not happy that she is seeing a NP and not the doctor.  Called pt and told her that she would see both in the same visit.  When asked which doctor, I told her Dr. Welton Flakes and she had requested Dr. Darnelle Catalan.  Cancelled 5/30 lab and 6/6 appt and Confirmed 12/31/12 appt w/ pt.  Mailed pt a new calendar.

## 2012-10-25 DIAGNOSIS — S62309A Unspecified fracture of unspecified metacarpal bone, initial encounter for closed fracture: Secondary | ICD-10-CM | POA: Diagnosis not present

## 2012-10-29 DIAGNOSIS — S62309A Unspecified fracture of unspecified metacarpal bone, initial encounter for closed fracture: Secondary | ICD-10-CM | POA: Diagnosis not present

## 2012-11-02 DIAGNOSIS — S62309A Unspecified fracture of unspecified metacarpal bone, initial encounter for closed fracture: Secondary | ICD-10-CM | POA: Diagnosis not present

## 2012-11-02 DIAGNOSIS — IMO0001 Reserved for inherently not codable concepts without codable children: Secondary | ICD-10-CM | POA: Diagnosis not present

## 2012-11-06 DIAGNOSIS — S62309A Unspecified fracture of unspecified metacarpal bone, initial encounter for closed fracture: Secondary | ICD-10-CM | POA: Diagnosis not present

## 2012-11-09 DIAGNOSIS — S62309A Unspecified fracture of unspecified metacarpal bone, initial encounter for closed fracture: Secondary | ICD-10-CM | POA: Diagnosis not present

## 2012-11-12 DIAGNOSIS — M810 Age-related osteoporosis without current pathological fracture: Secondary | ICD-10-CM | POA: Diagnosis not present

## 2012-11-12 DIAGNOSIS — S62309A Unspecified fracture of unspecified metacarpal bone, initial encounter for closed fracture: Secondary | ICD-10-CM | POA: Diagnosis not present

## 2012-11-12 DIAGNOSIS — E785 Hyperlipidemia, unspecified: Secondary | ICD-10-CM | POA: Diagnosis not present

## 2012-11-12 DIAGNOSIS — E039 Hypothyroidism, unspecified: Secondary | ICD-10-CM | POA: Diagnosis not present

## 2012-11-12 DIAGNOSIS — Z0000 Encounter for general adult medical examination without abnormal findings: Secondary | ICD-10-CM | POA: Diagnosis not present

## 2012-11-14 DIAGNOSIS — S62309A Unspecified fracture of unspecified metacarpal bone, initial encounter for closed fracture: Secondary | ICD-10-CM | POA: Diagnosis not present

## 2012-11-19 DIAGNOSIS — M899 Disorder of bone, unspecified: Secondary | ICD-10-CM | POA: Diagnosis not present

## 2012-11-19 DIAGNOSIS — E669 Obesity, unspecified: Secondary | ICD-10-CM | POA: Diagnosis not present

## 2012-11-19 DIAGNOSIS — Z1331 Encounter for screening for depression: Secondary | ICD-10-CM | POA: Diagnosis not present

## 2012-11-19 DIAGNOSIS — Z0000 Encounter for general adult medical examination without abnormal findings: Secondary | ICD-10-CM | POA: Diagnosis not present

## 2012-11-19 DIAGNOSIS — Z6831 Body mass index (BMI) 31.0-31.9, adult: Secondary | ICD-10-CM | POA: Diagnosis not present

## 2012-11-19 DIAGNOSIS — E785 Hyperlipidemia, unspecified: Secondary | ICD-10-CM | POA: Diagnosis not present

## 2012-11-19 DIAGNOSIS — E039 Hypothyroidism, unspecified: Secondary | ICD-10-CM | POA: Diagnosis not present

## 2012-11-19 DIAGNOSIS — M949 Disorder of cartilage, unspecified: Secondary | ICD-10-CM | POA: Diagnosis not present

## 2012-11-20 DIAGNOSIS — Z1212 Encounter for screening for malignant neoplasm of rectum: Secondary | ICD-10-CM | POA: Diagnosis not present

## 2012-11-21 ENCOUNTER — Telehealth: Payer: Self-pay | Admitting: *Deleted

## 2012-11-21 DIAGNOSIS — S62309A Unspecified fracture of unspecified metacarpal bone, initial encounter for closed fracture: Secondary | ICD-10-CM | POA: Diagnosis not present

## 2012-11-21 NOTE — Telephone Encounter (Signed)
Pt called in question about an appt w/ Dr. Darnelle Catalan.  Informed her that she was already scheduled to see Annice Pih and Dr. Darnelle Catalan on 6/2.  She said that she was told that she could be seen on the same date as she was scheduled w/ Dr. Donnie Coffin and I informed her that I was the one who spoke with her and scheduled her and the date she was originally scheduled with Dr. Donnie Coffin was on a Friday and Dr. Darnelle Catalan does not work on Friday and that is why we scheduled her for 6/2.  She also said that she was told that she was going to see the Oncologist only and I stated again that I was the one who spoke with her and scheduled this appt and told her that the fist visit, she would meet both Annice Pih and Dr. Darnelle Catalan and that's how we were scheduling all of Dr. Renelda Loma pts.  She was ok with that.

## 2012-11-23 DIAGNOSIS — S62309A Unspecified fracture of unspecified metacarpal bone, initial encounter for closed fracture: Secondary | ICD-10-CM | POA: Diagnosis not present

## 2012-11-26 DIAGNOSIS — S62309A Unspecified fracture of unspecified metacarpal bone, initial encounter for closed fracture: Secondary | ICD-10-CM | POA: Diagnosis not present

## 2012-11-30 DIAGNOSIS — S62309A Unspecified fracture of unspecified metacarpal bone, initial encounter for closed fracture: Secondary | ICD-10-CM | POA: Diagnosis not present

## 2012-12-03 DIAGNOSIS — S62309A Unspecified fracture of unspecified metacarpal bone, initial encounter for closed fracture: Secondary | ICD-10-CM | POA: Diagnosis not present

## 2012-12-11 DIAGNOSIS — S62309A Unspecified fracture of unspecified metacarpal bone, initial encounter for closed fracture: Secondary | ICD-10-CM | POA: Diagnosis not present

## 2012-12-14 DIAGNOSIS — S62309A Unspecified fracture of unspecified metacarpal bone, initial encounter for closed fracture: Secondary | ICD-10-CM | POA: Diagnosis not present

## 2012-12-17 DIAGNOSIS — S62319A Displaced fracture of base of unspecified metacarpal bone, initial encounter for closed fracture: Secondary | ICD-10-CM | POA: Diagnosis not present

## 2012-12-21 DIAGNOSIS — S62319A Displaced fracture of base of unspecified metacarpal bone, initial encounter for closed fracture: Secondary | ICD-10-CM | POA: Diagnosis not present

## 2012-12-26 DIAGNOSIS — S62319A Displaced fracture of base of unspecified metacarpal bone, initial encounter for closed fracture: Secondary | ICD-10-CM | POA: Diagnosis not present

## 2012-12-28 ENCOUNTER — Other Ambulatory Visit: Payer: Medicare Other

## 2012-12-28 DIAGNOSIS — S62319A Displaced fracture of base of unspecified metacarpal bone, initial encounter for closed fracture: Secondary | ICD-10-CM | POA: Diagnosis not present

## 2012-12-31 ENCOUNTER — Telehealth: Payer: Self-pay | Admitting: Oncology

## 2012-12-31 ENCOUNTER — Encounter: Payer: Self-pay | Admitting: Oncology

## 2012-12-31 ENCOUNTER — Ambulatory Visit (HOSPITAL_BASED_OUTPATIENT_CLINIC_OR_DEPARTMENT_OTHER): Payer: Medicare Other | Admitting: Oncology

## 2012-12-31 VITALS — BP 139/83 | HR 84 | Temp 98.0°F | Resp 20 | Ht 64.0 in | Wt 193.8 lb

## 2012-12-31 DIAGNOSIS — S62319A Displaced fracture of base of unspecified metacarpal bone, initial encounter for closed fracture: Secondary | ICD-10-CM | POA: Diagnosis not present

## 2012-12-31 DIAGNOSIS — C50919 Malignant neoplasm of unspecified site of unspecified female breast: Secondary | ICD-10-CM

## 2012-12-31 DIAGNOSIS — Z853 Personal history of malignant neoplasm of breast: Secondary | ICD-10-CM | POA: Diagnosis not present

## 2012-12-31 DIAGNOSIS — C50911 Malignant neoplasm of unspecified site of right female breast: Secondary | ICD-10-CM

## 2012-12-31 MED ORDER — ANASTROZOLE 1 MG PO TABS: 1.0000 mg | ORAL_TABLET | Freq: Every day | ORAL | Status: DC

## 2012-12-31 NOTE — Progress Notes (Signed)
[facility]  Telephone:(336) [phone] Fax:([phone]) [phone]  OFFICE PROGRESS NOTE   ID: Jamie Cole   DOB: July 20, 1941  MR#: [id number]  JJO#:[id number]   PCP: Gaspar Garbe, MD GYN:  SUClaud Kelp, M.D. RAD ONC: Maryln Gottron, M.D.   HISTORY OF PRESENT ILLNESS: From Dr. Renelda Loma new patient evaluation note dated 09/07/2011:  "This is a delightful 72 year old woman here today with her family for discussion of her recent diagnosis of breast cancer. She does undergo annual screening mammography. She did not take any abnormalities in her breast. A screening mammogram 08/15/2011 showed a possible mass in the right breast additional views were recommended. Last mammogram right breast ultrasound performed 08/31/2011 showed a suspicious 5 x 6 x 6 mm mass in the upper outer right breast with calcifications and a biopsy was recommended. Biopsy. performed 08/31/2011 demonstrated a invasive ductal cancer likely grade 2 HER-2 was negative the ratio 1.56 the tumor was strongly ER and PR positive. An MRI scans performed to 09/06/11 showed a mass in the right breast measuring 9 x 9 x 8 mm. No other lesions were seen. There is no other evidence of adenopathy. A 1 cm lesion in the left lobe of the liver was seen likely a cyst."    Her subsequent history is as detailed below.    INTERVAL HISTORY: Dr. Darnelle Catalan and I saw Jamie Cole today for followup of invasive ductal carcinoma with DCIS of the right breast.  This is Jamie Cole is accompanied for today's office visit by her husband Jamie Cole. The patient was last seen by Dr. Donnie Coffin on 07/05/2012 .  Since her last office visit, the patient has been doing relatively well.  She is establishing herself with Dr. Darrall Dears service today.  REVIEW OF SYSTEMS: A 10 point review of systems was completed and is negative except minor hot flashes, night sweats and leg pain.  The patient did suffer from a fall in 08/2012 and had a  fracture of her right hand fifth metacarpal but did not require surgery.  The patient denies any other symptomatology. She is tolerating anastrozole with no unusual side effects.  PAST MEDICAL HISTORY: Past Medical History  Diagnosis Date  . Endometrial hyperplasia, simple   . Endometrial polyp   . Wears glasses   . Hypothyroidism   . Complication of anesthesia     hard to wake up  . Skin cancer     no additional information known  . Breast cancer 08/31/11    DCIS; right breast, ER/PR +  . Breast CA   . Hx of radiation therapy 10/18/11 to 11/18/11    right breast  . Hx of radiation therapy 1957    4 tx to face for acne  . Metacarpal bone fracture 08/30/2012    Right 5th finger fracture    PAST SURGICAL HISTORY: Past Surgical History  Procedure Laterality Date  . Endometrial polyp    . Tonsillectomy    . Colonoscopy    . Mastectomy, partial  09/14/11    right breast    FAMILY HISTORY Family History  Problem Relation Age of Onset  . Skin cancer Father 39  . Breast cancer Paternal Aunt     x 2  . Stomach cancer Paternal Grandmother 60  . Breast cancer Cousin     ages 32-60 at dx x 4 paternal  . Prostate cancer Cousin     ????  . Brain cancer Maternal Uncle     brain  .  Cancer Cousin     tonsillar  . Colon cancer Neg Hx   . Stroke Father   . Stroke Brother     GYNECOLOGIC HISTORY: G1P1, menarche 58, menopause 47, HRT x 2-3y w/o unusual complications  SOCIAL HISTORY: The patient and her husband Jamie Cole have been married since 51.  She is a retired Runner, broadcasting/film/video and her husband is a retired Therapist, occupational. Patient's son is an Pensions consultant in [employer] and is a Engineer, production.  In her spare time the patient enjoys traveling, visiting with friends and family and crocheting.   ADVANCED DIRECTIVES: Not on file  HEALTH MAINTENANCE: History  Substance Use Topics  . Smoking status: Never Smoker   . Smokeless tobacco: Never Used  . Alcohol Use: No    Colonoscopy:  09/2011 PAP: 11/2011 Bone density: showed osteopenia with T -1.6 Lipid panel:  Not on file  Allergies  Allergen Reactions  . Other     PLEASE DO BLOOD PRESSURE IN LEFT ARM ONLY    Current Outpatient Prescriptions  Medication Sig Dispense Refill  . anastrozole (ARIMIDEX) 1 MG tablet Take 1 tablet (1 mg total) by mouth daily.  90 tablet  5  . Ascorbic Acid (VITAMIN C) 1000 MG tablet Take 1,000 mg by mouth daily.      . calcium citrate-vitamin D (CITRACAL+D) 315-200 MG-UNIT per tablet Take 1 tablet by mouth 2 (two) times daily.      Marland Kitchen levothyroxine (SYNTHROID) 112 MCG tablet Take 112 mcg by mouth daily before breakfast. Sunday and Wednesday 1/2 tab, 1 tablet all other days.      . Vitamin D, Ergocalciferol, (DRISDOL) 50000 UNITS CAPS Take 50,000 Units by mouth every 7 (seven) days.        No current facility-administered medications for this visit.    OBJECTIVE: Filed Vitals:   12/31/12 1425  BP: 139/83  Pulse: 84  Temp: 98 F (36.7 C)  Resp: 20     Body mass index is 33.25 kg/(m^2).      ECOG FS: 1 - Symptomatic but completely ambulatory  General appearance: Alert, cooperative, well nourished, no apparent distress Head: Normocephalic, without obvious abnormality, atraumatic Eyes: Arcus senilis, PERRLA, EOMI Nose: Nares, septum and mucosa are normal, no drainage or sinus tenderness Neck: No adenopathy, supple, symmetrical, trachea midline, thyroid not enlarged, no tenderness Resp: Clear to auscultation bilaterally Cardio: Regular rate and rhythm, S1, S2 normal, no murmur, click, rub or gallop Breasts: The right breast is visibly smaller than the left breast,  The right breast has well-healed surgical scar, right breast has visible architectural and radiation changes, thickened right areola area, bilateral firm medial mammary ridge, no lymphadenopathy, no nipple inversion, no axilla fullness GI: Soft, distended, non-tender, normoactive bowel sounds, no organomegaly Extremities:  Extremities normal, atraumatic, no cyanosis or edema, 2/5 bilateral upper extremity strength Skin: seborrheic keratosis on trunk and cervical areas Lymph nodes: Cervical, supraclavicular, and axillary nodes normal Neurologic: Grossly normal   LAB RESULTS: Lab Results  Component Value Date   WBC 5.9 06/25/2012   NEUTROABS 3.1 06/25/2012   HGB 14.1 06/25/2012   HCT 42.3 06/25/2012   MCV 95.2 06/25/2012   PLT 228 06/25/2012      Chemistry      Component Value Date/Time   NA 142 06/25/2012 1323   NA 140 10/04/2011 2159   K 4.1 06/25/2012 1323   K 4.0 10/04/2011 2159   CL 107 06/25/2012 1323   CL 105 10/04/2011 2159   CO2 27 06/25/2012  1323   CO2 26 10/04/2011 2159   BUN 13.0 06/25/2012 1323   BUN 21 10/04/2011 2159   CREATININE 0.7 06/25/2012 1323   CREATININE 0.60 10/04/2011 2159      Component Value Date/Time   CALCIUM 9.4 06/25/2012 1323   CALCIUM 9.4 10/04/2011 2159   ALKPHOS 95 06/25/2012 1323   ALKPHOS 102 10/04/2011 2159   AST 19 06/25/2012 1323   AST 14 10/04/2011 2159   ALT 16 06/25/2012 1323   ALT 11 10/04/2011 2159   BILITOT 0.43 06/25/2012 1323   BILITOT 0.1* 10/04/2011 2159      Lab Results  Component Value Date   LABCA2 18 06/25/2012    Urinalysis    Component Value Date/Time   COLORURINE YELLOW 09/14/2011 1013   APPEARANCEUR CLOUDY* 09/14/2011 1013   LABSPEC >1.030* 09/14/2011 1013   PHURINE 5.0 09/14/2011 1013   GLUCOSEU NEGATIVE 09/14/2011 1013   HGBUR NEGATIVE 09/14/2011 1013   BILIRUBINUR SMALL* 09/14/2011 1013   KETONESUR 15* 09/14/2011 1013   PROTEINUR NEGATIVE 09/14/2011 1013   UROBILINOGEN 0.2 09/14/2011 1013   NITRITE NEGATIVE 09/14/2011 1013   LEUKOCYTESUR NEGATIVE 09/14/2011 1013    STUDIES: bilateral digital diagnostic mammogram on 08/15/2012 showed postoperative/lumpectomy changes within the upper outer right breast again identified.  Right breast skin thickening is compatible with treatment changes.  There is no evidence of suspicious mass, nonsurgically  cold distortion or worrisome calcifications bilaterally.  No mammographic evidence of breast malignancy.  Benign findings.    ASSESSMENT: 72 y.o. BRCA negative [place], [place] woman:  1.  Status post right breast core biopsy 08/31/2011 showing invasive ductal carcinoma, ER 100%, PR 100%, Ki-67 19%, HER-2/neu by [facility] no amplification with a ratio 1.56.  2.  Status post right breast lumpectomy with right axillary sentinel node biopsy 09/14/2011 for a pT1b, pN0, stage IA invasive ductal carcinoma, grade 1, again HER-2/neu by C[facility] no amplification with ratio of 1.32  3. Status post radiation therapy from 10/18/2011 through 11/18/2011.  4. Started antiestrogen therapy with Arimidex in 10/2011.  5.  Osteopenia (last bone density scan on 08/09/2012 showed a T score of -1.6)    PLAN: Margit is doing well with anastrozole and the plan is to continue that at least one more year. Given the concern regarding osteopenia, one option would be to switch to tamoxifen after 2 years of anastrozole and then continue the tamoxifen for 3 years. This is equivalent to taking anastrozole for 5 years in terms of breast cancer risk reduction. We will discuss this further at her next visit here. Otherwise all questions were answered.  The patient was encouraged to contact us in the interim with any problems, questions or concerns.   Jamie Bras, NP-C   I personally saw this patient and performed a substantive portion of this encounter with the listed APP documented above.  Ruthann Cancer, MD 12/31/2012, 6:41 PM

## 2012-12-31 NOTE — Telephone Encounter (Signed)
gv pt appt for June 2013 and January 2015. Pt also given mammo appt for January 2015.

## 2012-12-31 NOTE — Patient Instructions (Addendum)
Please contact us at [phone] if you have any questions or concerns.  Please continue to do well and enjoy life!!!  Get plenty of rest, drink plenty of water, exercise daily (walking), eat a balanced diet.  Continue to take calcium and vitamin D3.

## 2013-01-01 ENCOUNTER — Other Ambulatory Visit (HOSPITAL_BASED_OUTPATIENT_CLINIC_OR_DEPARTMENT_OTHER): Payer: Medicare Other

## 2013-01-01 DIAGNOSIS — E559 Vitamin D deficiency, unspecified: Secondary | ICD-10-CM

## 2013-01-01 DIAGNOSIS — C50419 Malignant neoplasm of upper-outer quadrant of unspecified female breast: Secondary | ICD-10-CM | POA: Diagnosis not present

## 2013-01-01 DIAGNOSIS — Z853 Personal history of malignant neoplasm of breast: Secondary | ICD-10-CM

## 2013-01-01 LAB — COMPREHENSIVE METABOLIC PANEL (CC13)
Albumin: 3.9 g/dL (ref 3.5–5.0)
Alkaline Phosphatase: 96 U/L (ref 40–150)
BUN: 17.6 mg/dL (ref 7.0–26.0)
Calcium: 9.4 mg/dL (ref 8.4–10.4)
Chloride: 106 mEq/L (ref 98–107)
Creatinine: 0.7 mg/dL (ref 0.6–1.1)
Glucose: 94 mg/dl (ref 70–99)
Potassium: 4.1 mEq/L (ref 3.5–5.1)

## 2013-01-01 LAB — CBC WITH DIFFERENTIAL/PLATELET
BASO%: 1.6 % (ref 0.0–2.0)
HCT: 45.6 % (ref 34.8–46.6)
LYMPH%: 28.6 % (ref 14.0–49.7)
MCHC: 33.3 g/dL (ref 31.5–36.0)
MCV: 93.4 fL (ref 79.5–101.0)
MONO#: 0.4 10*3/uL (ref 0.1–0.9)
MONO%: 9.1 % (ref 0.0–14.0)
NEUT%: 57.6 % (ref 38.4–76.8)
Platelets: 231 10*3/uL (ref 145–400)
RBC: 4.88 10*6/uL (ref 3.70–5.45)
WBC: 4.9 10*3/uL (ref 3.9–10.3)
nRBC: 0 % (ref 0–0)

## 2013-01-02 ENCOUNTER — Telehealth: Payer: Self-pay | Admitting: Oncology

## 2013-01-02 ENCOUNTER — Telehealth: Payer: Self-pay

## 2013-01-02 DIAGNOSIS — S62319A Displaced fracture of base of unspecified metacarpal bone, initial encounter for closed fracture: Secondary | ICD-10-CM | POA: Diagnosis not present

## 2013-01-02 NOTE — Telephone Encounter (Signed)
Spoke with pts husband regarding lab results for 6/3. Per [facility], labs are ok. Mr. Syfert voiced understanding and will give the message to his wife. Knows to call office with any further questions. TMB

## 2013-01-02 NOTE — Telephone Encounter (Signed)
, °

## 2013-01-02 NOTE — Telephone Encounter (Signed)
Pt on recall @ [facility] for October f/u w/Dr. Kern Reap. S/w Dondra Spry. Pt  Has all other appts. See also 6/2 note.

## 2013-01-04 ENCOUNTER — Ambulatory Visit: Payer: Medicare Other | Admitting: Oncology

## 2013-01-04 ENCOUNTER — Ambulatory Visit: Payer: Medicare Other | Admitting: Family

## 2013-01-08 ENCOUNTER — Telehealth: Payer: Self-pay | Admitting: *Deleted

## 2013-01-08 NOTE — Telephone Encounter (Signed)
Vitamin D 50,000 IU 1 po qwk # 26/0 refills fax request received on 01/07/2013. Refill request approved and faxed to pharmacy on 01/08/2012.

## 2013-01-09 ENCOUNTER — Other Ambulatory Visit: Payer: Self-pay | Admitting: Obstetrics & Gynecology

## 2013-01-09 DIAGNOSIS — S62319A Displaced fracture of base of unspecified metacarpal bone, initial encounter for closed fracture: Secondary | ICD-10-CM | POA: Diagnosis not present

## 2013-01-10 ENCOUNTER — Encounter: Payer: Self-pay | Admitting: Obstetrics & Gynecology

## 2013-01-10 ENCOUNTER — Ambulatory Visit (INDEPENDENT_AMBULATORY_CARE_PROVIDER_SITE_OTHER): Payer: Medicare Other | Admitting: Obstetrics & Gynecology

## 2013-01-10 VITALS — BP 120/82 | HR 72 | Resp 16 | Ht 64.5 in | Wt 193.0 lb

## 2013-01-10 DIAGNOSIS — Z124 Encounter for screening for malignant neoplasm of cervix: Secondary | ICD-10-CM | POA: Diagnosis not present

## 2013-01-10 DIAGNOSIS — Z01419 Encounter for gynecological examination (general) (routine) without abnormal findings: Secondary | ICD-10-CM | POA: Diagnosis not present

## 2013-01-10 MED ORDER — VITAMIN D (ERGOCALCIFEROL) 1.25 MG (50000 UNIT) PO CAPS: 50000.0000 [IU] | ORAL_CAPSULE | ORAL | Status: DC

## 2013-01-10 NOTE — Progress Notes (Signed)
Patient ID: Jamie Cole, female   DOB: May 12, 1941, 72 y.o.   MRN: [id number] ` 72 y.o. G1P1001 MarriedCaucasianF here for annual exam.  No vaginal bleeding.  Has question for me about switching to Tamoxifen.  Has mild osteopenia on BMD done 1/14.  Reviewed Dr. Darrall Dears note from 12/31/12.  Patient has history of simple endometrial hyperplasia treated with hysteroscopy/D&C 2000.  We dicussed the increased risk of endometrial cancer with Tamoxifen use.  I feel she should stay on the Arimidex and not switch.  Also, we should repeat BMD 1/16 and reassess then.    Trying to lose weight.  Down about 10 pounds.  She was working on weight loss but fell and broke and broke 5th metacarpal on right hand.  Still in PT.  No LMP recorded. Patient is postmenopausal.          Sexually active: no  The current method of family planning is post menopausal status.    Exercising: yes  Gym/ health club routine includes exercise room when she is able to go to gym.. Smoker:  No  Health Maintenance: Pap:  12/20/11 History of abnormal Pap:  no MMG:  08/15/12 Colonoscopy:  09/2011.  Hyperplastic polyp.  Dr Juanda Chance.  Repeat 7-10 yrs.  BMD:   08/09/12  TDaP:  10/04/05  Screening Labs: PCP (Dr. Wylene Simmer), Hb today: PCP, Urine today: PCP   reports that she has never smoked. She has never used smokeless tobacco. She reports that she does not drink alcohol or use illicit drugs.  Past Medical History  Diagnosis Date  . Endometrial hyperplasia, simple   . Endometrial polyp   . Wears glasses   . Hypothyroidism   . Complication of anesthesia     hard to wake up  . Skin cancer     no additional information known  . Breast cancer 08/31/11    DCIS; right breast, ER/PR +  . Breast CA   . Hx of radiation therapy 10/18/11 to 11/18/11    right breast  . Hx of radiation therapy 1957    4 tx to face for acne  . Metacarpal bone fracture 08/30/2012    Right 5th finger fracture  . H/O TB skin testing 1970's    + INH rx  . Breast  cancer 2/13    9 MM R breast- E+/P+ HVR 2-  . Fracture     right arm    Past Surgical History  Procedure Laterality Date  . Endometrial polyp    . Tonsillectomy      adenoids  . Mastectomy, partial  09/14/11    right breast  . Hysteroscopy  12/00    polyps  . Breast lumpectomy  2/13    sentinel node    Current Outpatient Prescriptions  Medication Sig Dispense Refill  . anastrozole (ARIMIDEX) 1 MG tablet Take 1 tablet (1 mg total) by mouth daily.  90 tablet  5  . Ascorbic Acid (VITAMIN C) 1000 MG tablet Take 1,000 mg by mouth daily.      . calcium citrate-vitamin D (CITRACAL+D) 315-200 MG-UNIT per tablet Take 1 tablet by mouth 2 (two) times daily.      Marland Kitchen levothyroxine (SYNTHROID) 112 MCG tablet Take 112 mcg by mouth daily before breakfast. Sunday and Wednesday 1/2 tab, 1 tablet all other days.      . Vitamin D, Ergocalciferol, (DRISDOL) 50000 UNITS CAPS Take 50,000 Units by mouth every 7 (seven) days.        No current facility-administered  medications for this visit.    Family History  Problem Relation Age of Onset  . Skin cancer Father 48  . Breast cancer Paternal Aunt     x 2  . Stomach cancer Paternal Grandmother 67  . Breast cancer Cousin     ages 23-60 at dx x 4 paternal  . Prostate cancer Cousin     ????  . Brain cancer Maternal Uncle     brain  . Cancer Cousin     tonsillar  . Colon cancer Neg Hx   . Stroke Father   . Stroke Brother     ROS:  Pertinent items are noted in HPI.  Otherwise, a comprehensive ROS was negative.  Exam:   BP 120/82  Pulse 72  Resp 16  Ht 5' 4.5" (1.638 m)  Wt 193 lb (87.544 kg)  BMI 32.63 kg/m2  Weight change:-9lbs  Height: 5' 4.5" (163.8 cm)  Ht Readings from Last 3 Encounters:  01/10/13 5' 4.5" (1.638 m)  12/31/12 5\' 4"  (1.626 m)  07/16/12 5\' 4"  (1.626 m)    General appearance: alert, cooperative and appears stated age Head: Normocephalic, without obvious abnormality, atraumatic Neck: no adenopathy, supple,  symmetrical, trachea midline and thyroid normal to inspection and palpation Lungs: clear to auscultation bilaterally Breasts: normal appearance, no masses or tenderness, well healed scars right breast with radiation changes--stable since last exam Heart: regular rate and rhythm Abdomen: soft, non-tender; bowel sounds normal; no masses,  no organomegaly Extremities: extremities normal, atraumatic, no cyanosis or edema Skin: Skin color, texture, turgor normal. No rashes or lesions Lymph nodes: Cervical, supraclavicular, and axillary nodes normal. No abnormal inguinal nodes palpated Neurologic: Grossly normal   Pelvic: External genitalia:  no lesions              Urethra:  normal appearing urethra with no masses, tenderness or lesions              Bartholins and Skenes: normal                 Vagina: normal appearing vagina with normal color and discharge, no lesions              Cervix: no lesions              Pap taken: no Bimanual Exam:  Uterus:  normal size, contour, position, consistency, mobility, non-tender              Adnexa: normal adnexa and no mass, fullness, tenderness               Rectovaginal: Confirms               Anus:  normal sphincter tone, no lesions  A:  Well Woman with normal exam H/o 1a breast cancer, Er/PR +, s/p lumpectomy and radiation Remote hx of endometrial hyperplasia Osteopenia, wrist fx 2/14 from fall Vit D deficiency Hypothyroidism  P:   Mammogram yearly.  Does diagnostic mmg yearly. pap smear done one year ago.  Not done today. BMD 1/16.   Rx for Vit D 50,000 IU to pharmacy for one year. return annually or prn  An After Visit Summary was printed and given to the patient.

## 2013-01-10 NOTE — Patient Instructions (Signed)

## 2013-01-11 DIAGNOSIS — S62319A Displaced fracture of base of unspecified metacarpal bone, initial encounter for closed fracture: Secondary | ICD-10-CM | POA: Diagnosis not present

## 2013-01-15 DIAGNOSIS — S62319A Displaced fracture of base of unspecified metacarpal bone, initial encounter for closed fracture: Secondary | ICD-10-CM | POA: Diagnosis not present

## 2013-01-18 DIAGNOSIS — S62319A Displaced fracture of base of unspecified metacarpal bone, initial encounter for closed fracture: Secondary | ICD-10-CM | POA: Diagnosis not present

## 2013-01-21 DIAGNOSIS — S62319A Displaced fracture of base of unspecified metacarpal bone, initial encounter for closed fracture: Secondary | ICD-10-CM | POA: Diagnosis not present

## 2013-01-23 DIAGNOSIS — S62319A Displaced fracture of base of unspecified metacarpal bone, initial encounter for closed fracture: Secondary | ICD-10-CM | POA: Diagnosis not present

## 2013-01-28 DIAGNOSIS — S62319A Displaced fracture of base of unspecified metacarpal bone, initial encounter for closed fracture: Secondary | ICD-10-CM | POA: Diagnosis not present

## 2013-02-16 ENCOUNTER — Other Ambulatory Visit: Payer: Self-pay | Admitting: Internal Medicine

## 2013-02-28 ENCOUNTER — Other Ambulatory Visit: Payer: Self-pay | Admitting: Dermatology

## 2013-02-28 DIAGNOSIS — B079 Viral wart, unspecified: Secondary | ICD-10-CM | POA: Diagnosis not present

## 2013-02-28 DIAGNOSIS — D485 Neoplasm of uncertain behavior of skin: Secondary | ICD-10-CM | POA: Diagnosis not present

## 2013-03-25 DIAGNOSIS — S62319A Displaced fracture of base of unspecified metacarpal bone, initial encounter for closed fracture: Secondary | ICD-10-CM | POA: Diagnosis not present

## 2013-04-22 DIAGNOSIS — Z23 Encounter for immunization: Secondary | ICD-10-CM | POA: Diagnosis not present

## 2013-05-07 ENCOUNTER — Encounter (INDEPENDENT_AMBULATORY_CARE_PROVIDER_SITE_OTHER): Payer: Self-pay | Admitting: General Surgery

## 2013-05-07 ENCOUNTER — Ambulatory Visit (INDEPENDENT_AMBULATORY_CARE_PROVIDER_SITE_OTHER): Payer: Medicare Other | Admitting: General Surgery

## 2013-05-07 VITALS — BP 150/88 | HR 76 | Temp 97.2°F | Resp 14 | Ht 65.0 in | Wt 196.6 lb

## 2013-05-07 DIAGNOSIS — C50411 Malignant neoplasm of upper-outer quadrant of right female breast: Secondary | ICD-10-CM

## 2013-05-07 DIAGNOSIS — C50419 Malignant neoplasm of upper-outer quadrant of unspecified female breast: Secondary | ICD-10-CM

## 2013-05-07 NOTE — Patient Instructions (Signed)
Your physical exam today is normal. Both breasts and all the lymph node areas are normal. There is no evidence of cancer.  Continue to take the arimidex and keep your regular appointments with Dr. Darnelle Catalan  Be sure to get  mammograms in January of 2015  Return to see Dr. Derrell Lolling in one year.

## 2013-05-07 NOTE — Progress Notes (Signed)
Patient ID: Jamie Cole, female   DOB: 12-11-1940, 73 y.o.   MRN: [id number]  History: Jamie Cole is a 72 y.o. female. She returns for long-term followup regarding her right breast cancer.  On 09/14/2011 she had a right partial mastectomy and sentinel node biopsy. She had invasive ductal carcinoma, ER-positive, HER-2-negative, pathologic stage T1b., N0.  She completed her radiation therapy and is on Demadex. Mammograms on 08/15/2012  is category 2, look good.  She has no specific complaints about her breast. She is scheduled to see Dr. Darnelle Catalan every 6 months.  ROS: 10 system review of systems is negative except as described above.   Examination: Constitutional: She is oriented to person, place, and time. She appears well-developed and well-nourished. No distress.  HENT:  Head: Normocephalic and atraumatic.  Eyes: Conjunctivae normal and EOM are normal. Pupils are equal, round, and reactive to light.  Neck: Neck supple. No JVD present. No tracheal deviation present. No thyromegaly present.  Cardiovascular: Normal rate, regular rhythm, normal heart sounds and intact distal pulses.  No murmur heard.  Pulmonary/Chest: Effort normal and breath sounds normal. No respiratory distress. She has no wheezes. She has no rales. She exhibits no tenderness.  Breasts: Right breast shows a well-healed lateral scar and well-healed axillary scar. Tissues are soft. No palpable mass in either breast. No axillary adenopathy. No other skin changes.    Assessment : Invasive ductal carcinoma right breast, receptor positive, HER-2-negative. Pathologic stage TI B., N0.  No evidence of recurrence 10 months following right partial mastectomy and sentinel node biopsy, adjuvant radiation therapy, and ongoing adjuvant arimidex.  Plan: Reassurance Repeat bilateral mammograms January 2015 Continue her Arimidex See me in one year    Malissia Rabbani M. Derrell Lolling, M.D., [facility] General and  Minimally invasive Surgery Breast and Colorectal Surgery Office:   [phone] Pager:   [phone]

## 2013-06-05 DIAGNOSIS — L719 Rosacea, unspecified: Secondary | ICD-10-CM | POA: Diagnosis not present

## 2013-06-05 DIAGNOSIS — Z85828 Personal history of other malignant neoplasm of skin: Secondary | ICD-10-CM | POA: Diagnosis not present

## 2013-06-05 DIAGNOSIS — L821 Other seborrheic keratosis: Secondary | ICD-10-CM | POA: Diagnosis not present

## 2013-06-05 DIAGNOSIS — L570 Actinic keratosis: Secondary | ICD-10-CM | POA: Diagnosis not present

## 2013-06-05 DIAGNOSIS — D239 Other benign neoplasm of skin, unspecified: Secondary | ICD-10-CM | POA: Diagnosis not present

## 2013-07-01 DIAGNOSIS — H25049 Posterior subcapsular polar age-related cataract, unspecified eye: Secondary | ICD-10-CM | POA: Diagnosis not present

## 2013-07-01 DIAGNOSIS — H2510 Age-related nuclear cataract, unspecified eye: Secondary | ICD-10-CM | POA: Diagnosis not present

## 2013-07-27 IMAGING — MG MM DIGITAL DIAGNOSTIC LIMITED*R*
2 series · 2 of 2 positions shown · non-contrast
Comparison: 08/15/2011 and prior mammograms dating back to
08/04/2005

CLINICAL DATA: 70-year-old female with abnormal screening
mammogram - new right breast mass.

DIGITAL DIAGNOSTIC RIGHT MAMMOGRAM  AND RIGHT BREAST ULTRASOUND:

[R CC]
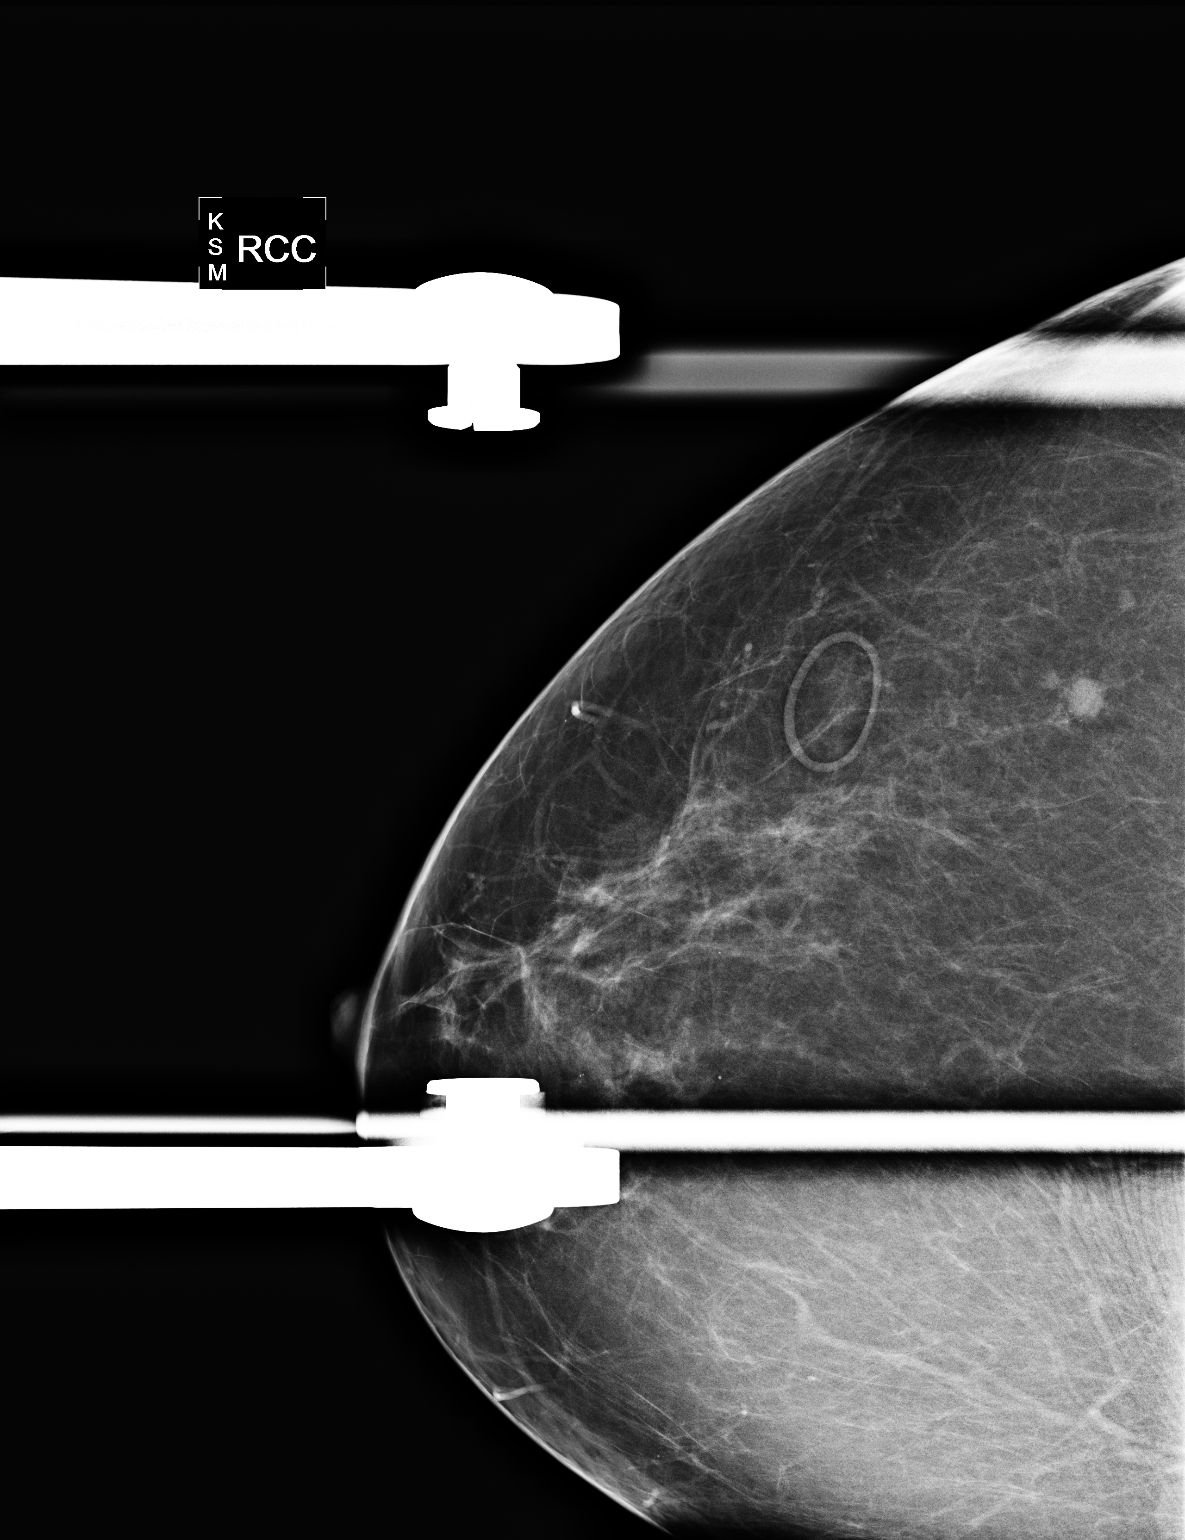

[R MLO]
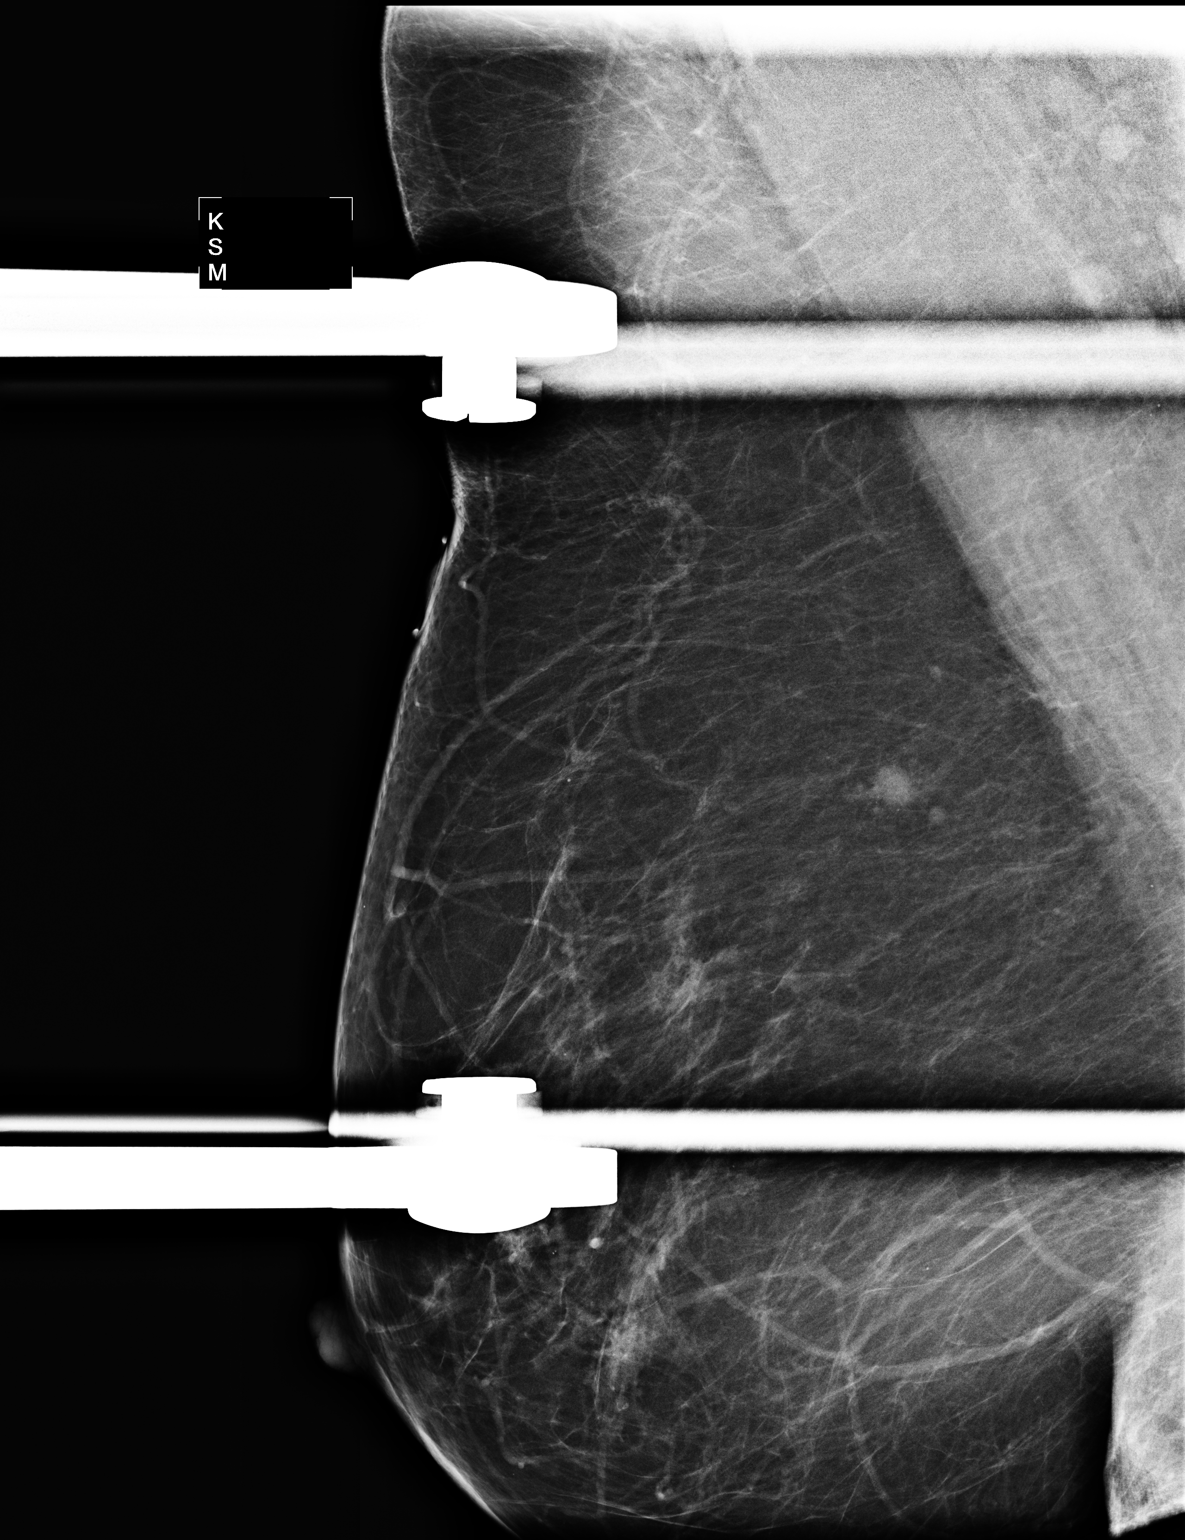

[2 of 2 positions shown; findings below may reference images not displayed]

FINDINGS: CC and MLO spot compression views of the right breast
demonstrate a 6 mm irregular mass with associated calcifications in
the upper outer right breast.  There are faint calcifications in
the linear configuration extending 1 cm anterior to the mass.

On physical exam, no palpable abnormalities identified in the upper
outer right breast

Ultrasound is performed, showing a 5 x 6 x 6 mm irregular
hypoechoic mass with echogenic rim and posterior acoustic shadowing
located at the 10 o'clock position of the right breast 7 cm from
the nipple.
No enlarged or abnormal-appearing right axillary lymph nodes are
identified.
IMPRESSION: Suspicious 5 x 6 x 6 mm irregular mass in the upper outer right
breast with faint calcifications extending 1 cm anteriorly.  Tissue
sampling is recommended.

This finding was discussed with the patient and she desires to
proceed with ultrasound guided right breast biopsy at this time.

BI-RADS CATEGORY 4:  Suspicious abnormality - biopsy should be
considered.

Recommend ultrasound guided biopsy of the right breast, which will
be performed today but dictated in a separate report.

## 2013-08-05 ENCOUNTER — Other Ambulatory Visit: Payer: Medicare Other | Admitting: Lab

## 2013-08-05 ENCOUNTER — Ambulatory Visit: Payer: Medicare Other | Admitting: Oncology

## 2013-08-19 ENCOUNTER — Other Ambulatory Visit: Payer: Self-pay | Admitting: Physician Assistant

## 2013-08-19 ENCOUNTER — Ambulatory Visit
Admission: RE | Admit: 2013-08-19 | Discharge: 2013-08-19 | Disposition: A | Discharge status: Home / Self Care | Payer: Medicare Other | Source: Ambulatory Visit | Attending: Family | Admitting: Family

## 2013-08-19 DIAGNOSIS — C50419 Malignant neoplasm of upper-outer quadrant of unspecified female breast: Secondary | ICD-10-CM

## 2013-08-19 DIAGNOSIS — Z853 Personal history of malignant neoplasm of breast: Secondary | ICD-10-CM

## 2013-08-19 DIAGNOSIS — R928 Other abnormal and inconclusive findings on diagnostic imaging of breast: Secondary | ICD-10-CM | POA: Diagnosis not present

## 2013-08-20 ENCOUNTER — Other Ambulatory Visit (HOSPITAL_BASED_OUTPATIENT_CLINIC_OR_DEPARTMENT_OTHER): Payer: Medicare Other

## 2013-08-20 ENCOUNTER — Encounter (INDEPENDENT_AMBULATORY_CARE_PROVIDER_SITE_OTHER): Payer: Self-pay

## 2013-08-20 DIAGNOSIS — C50419 Malignant neoplasm of upper-outer quadrant of unspecified female breast: Secondary | ICD-10-CM | POA: Diagnosis not present

## 2013-08-20 LAB — COMPREHENSIVE METABOLIC PANEL (CC13)
ALK PHOS: 89 U/L (ref 40–150)
ALT: 14 U/L (ref 0–55)
AST: 16 U/L (ref 5–34)
Albumin: 3.9 g/dL (ref 3.5–5.0)
Anion Gap: 9 mEq/L (ref 3–11)
BILIRUBIN TOTAL: 0.58 mg/dL (ref 0.20–1.20)
BUN: 16.6 mg/dL (ref 7.0–26.0)
CO2: 28 mEq/L (ref 22–29)
CREATININE: 0.7 mg/dL (ref 0.6–1.1)
Calcium: 9.5 mg/dL (ref 8.4–10.4)
Chloride: 106 mEq/L (ref 98–109)
Glucose: 97 mg/dl (ref 70–140)
Potassium: 4.4 mEq/L (ref 3.5–5.1)
Sodium: 142 mEq/L (ref 136–145)
Total Protein: 6.6 g/dL (ref 6.4–8.3)

## 2013-08-20 LAB — CBC WITH DIFFERENTIAL/PLATELET
BASO%: 1 % (ref 0.0–2.0)
Basophils Absolute: 0.1 10*3/uL (ref 0.0–0.1)
EOS%: 3.6 % (ref 0.0–7.0)
Eosinophils Absolute: 0.2 10*3/uL (ref 0.0–0.5)
HEMATOCRIT: 44.1 % (ref 34.8–46.6)
HGB: 14.7 g/dL (ref 11.6–15.9)
LYMPH#: 1.7 10*3/uL (ref 0.9–3.3)
LYMPH%: 30.9 % (ref 14.0–49.7)
MCH: 31.6 pg (ref 25.1–34.0)
MCHC: 33.2 g/dL (ref 31.5–36.0)
MCV: 95.3 fL (ref 79.5–101.0)
MONO#: 0.6 10*3/uL (ref 0.1–0.9)
MONO%: 10.1 % (ref 0.0–14.0)
NEUT#: 3 10*3/uL (ref 1.5–6.5)
NEUT%: 54.4 % (ref 38.4–76.8)
PLATELETS: 246 10*3/uL (ref 145–400)
RBC: 4.63 10*6/uL (ref 3.70–5.45)
RDW: 13.2 % (ref 11.2–14.5)
WBC: 5.5 10*3/uL (ref 3.9–10.3)

## 2013-08-22 ENCOUNTER — Ambulatory Visit (HOSPITAL_BASED_OUTPATIENT_CLINIC_OR_DEPARTMENT_OTHER): Payer: Medicare Other | Admitting: Oncology

## 2013-08-22 ENCOUNTER — Other Ambulatory Visit: Payer: Medicare Other | Admitting: Lab

## 2013-08-22 ENCOUNTER — Ambulatory Visit (HOSPITAL_BASED_OUTPATIENT_CLINIC_OR_DEPARTMENT_OTHER): Payer: Medicare Other

## 2013-08-22 ENCOUNTER — Telehealth: Payer: Self-pay | Admitting: Oncology

## 2013-08-22 VITALS — BP 154/83 | HR 75 | Temp 98.3°F | Resp 20 | Ht 65.0 in | Wt 198.1 lb

## 2013-08-22 DIAGNOSIS — Z170 Estrogen receptor positive status [ER+]: Secondary | ICD-10-CM | POA: Diagnosis not present

## 2013-08-22 DIAGNOSIS — C50419 Malignant neoplasm of upper-outer quadrant of unspecified female breast: Secondary | ICD-10-CM | POA: Diagnosis not present

## 2013-08-22 DIAGNOSIS — C50411 Malignant neoplasm of upper-outer quadrant of right female breast: Secondary | ICD-10-CM

## 2013-08-22 DIAGNOSIS — E559 Vitamin D deficiency, unspecified: Secondary | ICD-10-CM | POA: Diagnosis not present

## 2013-08-22 DIAGNOSIS — M949 Disorder of cartilage, unspecified: Secondary | ICD-10-CM | POA: Diagnosis not present

## 2013-08-22 DIAGNOSIS — M899 Disorder of bone, unspecified: Secondary | ICD-10-CM

## 2013-08-22 LAB — LACTATE DEHYDROGENASE (CC13): LDH: 185 U/L (ref 125–245)

## 2013-08-22 NOTE — Telephone Encounter (Signed)
pt sent back to lab and given appt schedule for july.

## 2013-08-22 NOTE — Progress Notes (Signed)
[facility]  Telephone:([phone] Fax:(336) [phone]  OFFICE PROGRESS NOTE   ID: Jamie Cole   DOB: 1940/09/07  MR#: [id number]  QDI#:[id number]   PCP: [name], MD GYN:  SUFanny Skates, M.D. RAD ONC: Rexene Edison, M.D.   HISTORY OF PRESENT ILLNESS: From Dr. Julien Girt new patient evaluation note dated 09/07/2011:  "This is a delightful 73 year old woman here today with her family for discussion of her recent diagnosis of breast cancer. She does undergo annual screening mammography. She did not take any abnormalities in her breast. A screening mammogram 08/15/2011 showed a possible mass in the right breast additional views were recommended. Last mammogram right breast ultrasound performed 08/31/2011 showed a suspicious 5 x 6 x 6 mm mass in the upper outer right breast with calcifications and a biopsy was recommended. Biopsy. performed 08/31/2011 demonstrated a invasive ductal cancer likely grade 2 HER-2 was negative the ratio 1.56 the tumor was strongly ER and PR positive. An MRI scans performed to 09/06/11 showed a mass in the right breast measuring 9 x 9 x 8 mm. No other lesions were seen. There is no other evidence of adenopathy. A 1 cm lesion in the left lobe of the liver was seen likely a cyst."    Her subsequent history is as detailed below.    INTERVAL HISTORY: Jamie Cole returns today for followup of her breast cancer accompanied by her husband Jamie Cole. The interval history is generally unremarkable. She "stays busy", [facility] [employer] [facility], and occasionally takes walks outside for exercise.  REVIEW OF SYSTEMS: She is tolerating the anastrozole with no significant side effects that she is aware of and particularly hot flashes and bilateral dryness or not concerns. She does get leg cramps in her right leg sometimes and she treats at with a glass of water. She tells me her cataracts are growing. Sometimes she has a little bit of "stiffness" in her bones, and  she has had a few slight headaches, which are brief and not intense. A detailed review of systems today was otherwise noncontributory  PAST MEDICAL HISTORY: Past Medical History  Diagnosis Date  . Endometrial hyperplasia, simple   . Endometrial polyp   . Hypothyroidism   . Basal cell carcinoma of neck        . Breast cancer 08/31/11    DCIS; right breast, ER/PR +  . Hx of radiation therapy 10/18/11 to 11/18/11    right breast  . Hx of radiation therapy 1957    4 tx to face for acne  . Metacarpal bone fracture 08/30/2012    Right 5th finger fracture  . H/O TB skin testing 1970's    + INH rx  . Breast cancer 2/13    9 MM R breast- E+/P+ HVR 2-  . Fracture     right arm    PAST SURGICAL HISTORY: Past Surgical History  Procedure Laterality Date  . Endometrial polyp    . Tonsillectomy      adenoids  . Mastectomy, partial  09/14/11    right breast  . Hysteroscopy  12/00    polyps  . Breast lumpectomy  2/13    sentinel node    FAMILY HISTORY Family History  Problem Relation Age of Onset  . Skin cancer Father 70  . Breast cancer Paternal Aunt     x 2  . Stomach cancer Paternal Grandmother 8  . Breast cancer Cousin     ages 67-60 at dx x 4 paternal  .  Prostate cancer Cousin     ????  . Brain cancer Maternal Uncle     brain  . Cancer Cousin     tonsillar  . Colon cancer Neg Hx   . Stroke Father   . Stroke Brother     GYNECOLOGIC HISTORY: G1P1, menarche 78, menopause 89, HRT x 2-3y w/o unusual complications  SOCIAL HISTORY: The patient and her husband Jamie Cole have been married since 17.  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 Patient's son is an Forensic psychologist in [facility] and is a Occupational psychologist. The patient has no grandchildren but 4 grandcats. In her spare time the patient enjoys traveling, visiting with friends and family and crocheting.   ADVANCED DIRECTIVES:   HEALTH MAINTENANCE: History  Substance Use Topics  . Smoking  status: Never Smoker   . Smokeless tobacco: Never Used  . Alcohol Use: No    Colonoscopy: 09/2011 PAP: 11/2011 Bone density: showed osteopenia with T -1.6 Lipid panel:  Not on file  Allergies  Allergen Reactions  . Other     PLEASE DO BLOOD PRESSURE IN LEFT ARM ONLY    Current Outpatient Prescriptions  Medication Sig Dispense Refill  . anastrozole (ARIMIDEX) 1 MG tablet Take 1 tablet (1 mg total) by mouth daily.  90 tablet  5  . Ascorbic Acid (VITAMIN C) 1000 MG tablet Take 1,000 mg by mouth daily.      Marland Kitchen levothyroxine (SYNTHROID) 112 MCG tablet Take 112 mcg by mouth daily before breakfast. Sunday and Wednesday 1/2 tab, 1 tablet all other days.      . Vitamin D, Ergocalciferol, (DRISDOL) 50000 UNITS CAPS Take 1 capsule (50,000 Units total) by mouth every 7 (seven) days.  12 capsule  4   No current facility-administered medications for this visit.    OBJECTIVE: Older white female woman in no acute distress Filed Vitals:   08/22/13 1325  BP: 154/83  Pulse: 75  Temp: 98.3 F (36.8 C)  Resp: 20     Body mass index is 32.97 kg/(m^2).      ECOG FS: 0 - Asymptomatic  Sclerae unicteric, pupils equal and round Oropharynx clear and moist-- no thrush--good dentition No cervical or supraclavicular adenopathy Lungs no rales or rhonchi Heart regular rate and rhythm Abd soft, nontender, positive bowel sounds MSK no focal spinal tenderness, no upper extremity lymphedema Neuro: nonfocal, well oriented, appropriate affect Breasts: The right breast is status post lumpectomy and radiation. There is no evidence of local recurrence. The right axilla is benign. The left breast is unremarkable   LAB RESULTS: Lab Results  Component Value Date   WBC 5.5 08/20/2013   NEUTROABS 3.0 08/20/2013   HGB 14.7 08/20/2013   HCT 44.1 08/20/2013   MCV 95.3 08/20/2013   PLT 246 08/20/2013      Chemistry      Component Value Date/Time   NA 142 08/20/2013 1018   NA 140 10/04/2011 2159   K 4.4 08/20/2013  1018   K 4.0 10/04/2011 2159   CL 106 01/01/2013 0845   CL 105 10/04/2011 2159   CO2 28 08/20/2013 1018   CO2 26 10/04/2011 2159   BUN 16.6 08/20/2013 1018   BUN 21 10/04/2011 2159   CREATININE 0.7 08/20/2013 1018   CREATININE 0.60 10/04/2011 2159      Component Value Date/Time   CALCIUM 9.5 08/20/2013 1018   CALCIUM 9.4 10/04/2011 2159   ALKPHOS 89 08/20/2013 1018   ALKPHOS 102  10/04/2011 2159   AST 16 08/20/2013 1018   AST 14 10/04/2011 2159   ALT 14 08/20/2013 1018   ALT 11 10/04/2011 2159   BILITOT 0.58 08/20/2013 1018   BILITOT 0.1* 10/04/2011 2159      Lab Results  Component Value Date   LABCA2 18 06/25/2012    Urinalysis    Component Value Date/Time   COLORURINE YELLOW 09/14/2011 1013   APPEARANCEUR CLOUDY* 09/14/2011 1013   LABSPEC >1.030* 09/14/2011 1013   PHURINE 5.0 09/14/2011 1013   GLUCOSEU NEGATIVE 09/14/2011 1013   HGBUR NEGATIVE 09/14/2011 1013   BILIRUBINUR SMALL* 09/14/2011 1013   KETONESUR 15* 09/14/2011 1013   PROTEINUR NEGATIVE 09/14/2011 1013   UROBILINOGEN 0.2 09/14/2011 1013   NITRITE NEGATIVE 09/14/2011 1013   LEUKOCYTESUR NEGATIVE 09/14/2011 1013    STUDIES: Mm Digital Diagnostic Bilat  08/19/2013   CLINICAL DATA:  Patient presents for bilateral diagnostic mammogram due to a history of prior malignant right lumpectomy 2013 post radiation.  EXAM: DIGITAL DIAGNOSTIC BILATERAL MAMMOGRAM WITH 3D TOMO WITH CAD  COMPARISON:  Previous exams.  ACR Breast Density Category b: There are scattered areas of fibroglandular density.  FINDINGS: Examination demonstrates stable post lumpectomy changes of the upper-outer quadrant of the right breast. There is mild post radiation skin thickening present. Remainder of the exam is unchanged.  Mammographic images were processed with CAD.  IMPRESSION: Stable post lumpectomy changes of the right breast.  RECOMMENDATION: Recommend continued annual bilateral diagnostic mammographic evaluation.  I have discussed the findings and recommendations with the  patient. Results were also provided in writing at the conclusion of the visit.  BI-RADS CATEGORY  2: Benign Finding(s)   Electronically Signed   By: Marin Olp M.D.   On: 08/19/2013 11:24     ASSESSMENT: 73 y.o. BRCA negative [place], [place] woman:  1.  Status post right breast core biopsy 08/31/2011 showing invasive ductal carcinoma, ER 100%, PR 100%, Ki-67 19%, HER-2/neu by CISH no amplification with a ratio 1.56.  2.  Status post right breast lumpectomy with right axillary sentinel node biopsy 09/14/2011 for a pT1b, pN0, stage IA invasive ductal carcinoma, grade 1, again HER-2/neu by CISH no amplification with ratio of 1.32  3. Status post radiation therapy from 10/18/2011 through 11/18/2011.  4. Started antiestrogen therapy with Arimidex in 10/2011.  5.  Osteopenia (last bone density scan on 08/09/2012 showed a T score of -1.6)    PLAN: We spent the better part of today's 40 minute visit going over options. I emphasized that she has a very small, low-grade, not fast-growing breast cancer history, and that her prognosis is excellent. She is tolerating the anastrozole well and the only concern there is the osteopenia.  We went over tamoxifen as an alternative. We discussed the possible toxicities, side effects and complications. Because of the concern regarding endometrial cancer and cataracts she would prefer to stay with the anastrozole.  That being the case we discussed treatment of osteopenia. In particular I suggested she consider either zolendronate intravenously once a year or denosumab subcutaneously twice a year. Either one of those is likely to significantly improve her bone density in a relatively short period of time. If she chooses either of them we would continue it so long as she is on anastrozole, which has 3 more years.  Noell has a good understanding of all this. At this point she is not ready to make a decision regarding osteopenia treatment beyond continuing  on vitamin D and increasing her  walking. She will let us know if she changes her mild. Otherwise she will see [place] again in 6 months. She knows to call for any problems that may develop before her next visit here.  08/22/2013, 1:29 PM

## 2013-08-22 NOTE — Addendum Note (Signed)
Addended by: Laureen Abrahams on: 08/22/2013 05:17 PM   Modules accepted: Medications

## 2013-08-23 LAB — VITAMIN D 25 HYDROXY (VIT D DEFICIENCY, FRACTURES): Vit D, 25-Hydroxy: 55 ng/mL (ref 30–89)

## 2013-08-28 NOTE — Progress Notes (Signed)
Reviewed personally.  M. Suzanne Maretta Overdorf, MD.  

## 2013-11-15 DIAGNOSIS — R7309 Other abnormal glucose: Secondary | ICD-10-CM | POA: Diagnosis not present

## 2013-11-15 DIAGNOSIS — M949 Disorder of cartilage, unspecified: Secondary | ICD-10-CM | POA: Diagnosis not present

## 2013-11-15 DIAGNOSIS — M899 Disorder of bone, unspecified: Secondary | ICD-10-CM | POA: Diagnosis not present

## 2013-11-15 DIAGNOSIS — E785 Hyperlipidemia, unspecified: Secondary | ICD-10-CM | POA: Diagnosis not present

## 2013-11-15 DIAGNOSIS — E039 Hypothyroidism, unspecified: Secondary | ICD-10-CM | POA: Diagnosis not present

## 2013-11-15 DIAGNOSIS — R82998 Other abnormal findings in urine: Secondary | ICD-10-CM | POA: Diagnosis not present

## 2013-11-15 DIAGNOSIS — R809 Proteinuria, unspecified: Secondary | ICD-10-CM | POA: Diagnosis not present

## 2013-11-22 DIAGNOSIS — E669 Obesity, unspecified: Secondary | ICD-10-CM | POA: Diagnosis not present

## 2013-11-22 DIAGNOSIS — D126 Benign neoplasm of colon, unspecified: Secondary | ICD-10-CM | POA: Diagnosis not present

## 2013-11-22 DIAGNOSIS — M899 Disorder of bone, unspecified: Secondary | ICD-10-CM | POA: Diagnosis not present

## 2013-11-22 DIAGNOSIS — M1610 Unilateral primary osteoarthritis, unspecified hip: Secondary | ICD-10-CM | POA: Diagnosis not present

## 2013-11-22 DIAGNOSIS — E785 Hyperlipidemia, unspecified: Secondary | ICD-10-CM | POA: Diagnosis not present

## 2013-11-22 DIAGNOSIS — M949 Disorder of cartilage, unspecified: Secondary | ICD-10-CM | POA: Diagnosis not present

## 2013-11-22 DIAGNOSIS — Z23 Encounter for immunization: Secondary | ICD-10-CM | POA: Diagnosis not present

## 2013-11-22 DIAGNOSIS — M169 Osteoarthritis of hip, unspecified: Secondary | ICD-10-CM | POA: Diagnosis not present

## 2013-11-22 DIAGNOSIS — D0590 Unspecified type of carcinoma in situ of unspecified breast: Secondary | ICD-10-CM | POA: Diagnosis not present

## 2013-11-22 DIAGNOSIS — Z0000 Encounter for general adult medical examination without abnormal findings: Secondary | ICD-10-CM | POA: Diagnosis not present

## 2013-11-22 DIAGNOSIS — Z1331 Encounter for screening for depression: Secondary | ICD-10-CM | POA: Diagnosis not present

## 2013-11-22 DIAGNOSIS — E039 Hypothyroidism, unspecified: Secondary | ICD-10-CM | POA: Diagnosis not present

## 2013-11-28 DIAGNOSIS — Z1212 Encounter for screening for malignant neoplasm of rectum: Secondary | ICD-10-CM | POA: Diagnosis not present

## 2013-12-12 DIAGNOSIS — W458XXA Other foreign body or object entering through skin, initial encounter: Secondary | ICD-10-CM | POA: Diagnosis not present

## 2014-01-20 DIAGNOSIS — L821 Other seborrheic keratosis: Secondary | ICD-10-CM | POA: Diagnosis not present

## 2014-01-25 ENCOUNTER — Other Ambulatory Visit: Payer: Self-pay | Admitting: Oncology

## 2014-02-14 DIAGNOSIS — Z23 Encounter for immunization: Secondary | ICD-10-CM | POA: Diagnosis not present

## 2014-02-14 DIAGNOSIS — Z0389 Encounter for observation for other suspected diseases and conditions ruled out: Secondary | ICD-10-CM | POA: Diagnosis not present

## 2014-02-19 ENCOUNTER — Other Ambulatory Visit (HOSPITAL_BASED_OUTPATIENT_CLINIC_OR_DEPARTMENT_OTHER): Payer: Medicare Other

## 2014-02-19 DIAGNOSIS — E559 Vitamin D deficiency, unspecified: Secondary | ICD-10-CM | POA: Diagnosis not present

## 2014-02-19 DIAGNOSIS — C50411 Malignant neoplasm of upper-outer quadrant of right female breast: Secondary | ICD-10-CM

## 2014-02-19 DIAGNOSIS — C50419 Malignant neoplasm of upper-outer quadrant of unspecified female breast: Secondary | ICD-10-CM | POA: Diagnosis not present

## 2014-02-19 LAB — CBC WITH DIFFERENTIAL/PLATELET
BASO%: 1.1 % (ref 0.0–2.0)
Basophils Absolute: 0.1 10*3/uL (ref 0.0–0.1)
EOS ABS: 0.3 10*3/uL (ref 0.0–0.5)
EOS%: 6 % (ref 0.0–7.0)
HEMATOCRIT: 43.5 % (ref 34.8–46.6)
HGB: 14.2 g/dL (ref 11.6–15.9)
LYMPH#: 1.8 10*3/uL (ref 0.9–3.3)
LYMPH%: 31.9 % (ref 14.0–49.7)
MCH: 31.2 pg (ref 25.1–34.0)
MCHC: 32.6 g/dL (ref 31.5–36.0)
MCV: 96 fL (ref 79.5–101.0)
MONO#: 0.5 10*3/uL (ref 0.1–0.9)
MONO%: 9 % (ref 0.0–14.0)
NEUT%: 52 % (ref 38.4–76.8)
NEUTROS ABS: 3 10*3/uL (ref 1.5–6.5)
PLATELETS: 243 10*3/uL (ref 145–400)
RBC: 4.53 10*6/uL (ref 3.70–5.45)
RDW: 13.5 % (ref 11.2–14.5)
WBC: 5.8 10*3/uL (ref 3.9–10.3)

## 2014-02-19 LAB — COMPREHENSIVE METABOLIC PANEL (CC13)
ALT: 16 U/L (ref 0–55)
ANION GAP: 10 meq/L (ref 3–11)
AST: 18 U/L (ref 5–34)
Albumin: 3.8 g/dL (ref 3.5–5.0)
Alkaline Phosphatase: 80 U/L (ref 40–150)
BILIRUBIN TOTAL: 0.56 mg/dL (ref 0.20–1.20)
BUN: 17.6 mg/dL (ref 7.0–26.0)
CALCIUM: 9.4 mg/dL (ref 8.4–10.4)
CHLORIDE: 109 meq/L (ref 98–109)
CO2: 23 meq/L (ref 22–29)
Creatinine: 0.7 mg/dL (ref 0.6–1.1)
GLUCOSE: 100 mg/dL (ref 70–140)
Potassium: 4.1 mEq/L (ref 3.5–5.1)
SODIUM: 142 meq/L (ref 136–145)
TOTAL PROTEIN: 6.6 g/dL (ref 6.4–8.3)

## 2014-02-19 LAB — LACTATE DEHYDROGENASE (CC13): LDH: 189 U/L (ref 125–245)

## 2014-02-20 LAB — VITAMIN D 25 HYDROXY (VIT D DEFICIENCY, FRACTURES): Vit D, 25-Hydroxy: 59 ng/mL (ref 30–89)

## 2014-02-23 ENCOUNTER — Other Ambulatory Visit: Payer: Self-pay | Admitting: Obstetrics & Gynecology

## 2014-02-24 NOTE — Telephone Encounter (Signed)
Last AEX: 01/10/13 Last refill:01/10/13 #12 4 rfs  Current AEX: 03/13/14 Last Vitamin D check: 02/19/14 59  Please advise

## 2014-02-27 ENCOUNTER — Ambulatory Visit (HOSPITAL_BASED_OUTPATIENT_CLINIC_OR_DEPARTMENT_OTHER): Payer: Medicare Other | Admitting: Oncology

## 2014-02-27 ENCOUNTER — Telehealth: Payer: Self-pay | Admitting: Oncology

## 2014-02-27 VITALS — BP 162/68 | HR 74 | Temp 98.1°F | Resp 18 | Ht 65.0 in | Wt 202.3 lb

## 2014-02-27 DIAGNOSIS — C50912 Malignant neoplasm of unspecified site of left female breast: Secondary | ICD-10-CM

## 2014-02-27 DIAGNOSIS — C50419 Malignant neoplasm of upper-outer quadrant of unspecified female breast: Secondary | ICD-10-CM | POA: Diagnosis not present

## 2014-02-27 DIAGNOSIS — M949 Disorder of cartilage, unspecified: Secondary | ICD-10-CM | POA: Diagnosis not present

## 2014-02-27 DIAGNOSIS — M8580 Other specified disorders of bone density and structure, unspecified site: Secondary | ICD-10-CM

## 2014-02-27 DIAGNOSIS — M899 Disorder of bone, unspecified: Secondary | ICD-10-CM | POA: Diagnosis not present

## 2014-02-27 DIAGNOSIS — Z170 Estrogen receptor positive status [ER+]: Secondary | ICD-10-CM | POA: Diagnosis not present

## 2014-02-27 NOTE — Progress Notes (Signed)
[facility]  [phone]) [phone] Fax:(336) [phone]  OFFICE PROGRESS NOTE   ID: Jamie DIDONATO   DOB: 11-23-1940  MR#: [id number]  OIZ#:[id number]   PCP: [name], MD GYN:  SUFanny Skates, M.D. RAD ONC: Rexene Edison, M.D.  CHIEF COMPLAINT: Estrogen receptor positive her early stage breast cancer  CURRENT TREATMENT: Anastrozole   BREAST CANCER ISTORY From Dr. Julien Girt new patient evaluation note dated 09/07/2011:  "This is a delightful 73 year old woman here today with her family for discussion of her recent diagnosis of breast cancer. She does undergo annual screening mammography. She did not take any abnormalities in her breast. A screening mammogram 08/15/2011 showed a possible mass in the right breast additional views were recommended. Last mammogram right breast ultrasound performed 08/31/2011 showed a suspicious 5 x 6 x 6 mm mass in the upper outer right breast with calcifications and a biopsy was recommended. Biopsy. performed 08/31/2011 demonstrated a invasive ductal cancer likely grade 2 HER-2 was negative the ratio 1.56 the tumor was strongly ER and PR positive. An MRI scans performed to 09/06/11 showed a mass in the right breast measuring 9 x 9 x 8 mm. No other lesions were seen. There is no other evidence of adenopathy. A 1 cm lesion in the left lobe of the liver was seen likely a cyst."    Her subsequent history is as detailed below.    INTERVAL HISTORY: Jamie Cole returns today for followup of her breast cancer accompanied by her husband Jamie Cole. The interval history is generally unremarkable.Except that her son and his wife have adopted a child from [place]. Gym and Alfie are very excited. The fundus of the baby are wonderful.   REVIEW OF SYSTEMS: She had some hot flashes initially with the anastrozole but does have subsided. Vital dryness is not an issue. She was exercising more regularly but broke her right breast earlier this year and has not resumed  her exercises. She is planning to however. Aside from that a detailed review of systems today was entirely stable   PAST MEDICAL HISTORY: Past Medical History  Diagnosis Date  . Endometrial hyperplasia, simple   . Endometrial polyp   . Hypothyroidism   . Basal cell carcinoma of neck        . Breast cancer 08/31/11    DCIS; right breast, ER/PR +  . Hx of radiation therapy 10/18/11 to 11/18/11    right breast  . Hx of radiation therapy 1957    4 tx to face for acne  . Metacarpal bone fracture 08/30/2012    Right 5th finger fracture  . H/O TB skin testing 1970's    + INH rx  . Breast cancer 2/13    9 MM R breast- E+/P+ HVR 2-  . Fracture     right arm    PAST SURGICAL HISTORY: Past Surgical History  Procedure Laterality Date  . Endometrial polyp    . Tonsillectomy      adenoids  . Mastectomy, partial  09/14/11    right breast  . Hysteroscopy  12/00    polyps  . Breast lumpectomy  2/13    sentinel node    FAMILY HISTORY Family History  Problem Relation Age of Onset  . Skin cancer Father 73  . Breast cancer Paternal Aunt     x 2  . Stomach cancer Paternal Grandmother 12  . Breast cancer Cousin     ages 55-60 at dx x 4 paternal  . Prostate cancer  Cousin     ????  . Brain cancer Maternal Uncle     brain  . Cancer Cousin     tonsillar  . Colon cancer Neg Hx   . Stroke Father   . Stroke Brother     GYNECOLOGIC HISTORY: G1P1, menarche 59, menopause 63, HRT x 2-3y w/o unusual complications  SOCIAL HISTORY: The patient and her husband Jamie Cole have been married since 38.  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 Patient's son is an Forensic psychologist in [facility] [employer] and is a Occupational psychologist. The patient has no grandchildren but 4 grandcats. In her spare time the patient enjoys traveling, visiting with friends and family and crocheting.   ADVANCED DIRECTIVES:   HEALTH MAINTENANCE: History  Substance Use Topics  . Smoking status: Never Smoker    . Smokeless tobacco: Never Used  . Alcohol Use: No    Colonoscopy: 09/2011 PAP: 11/2011 Bone density: showed osteopenia with T -1.6 Lipid panel:  Not on file  Allergies  Allergen Reactions  . Other     PLEASE DO BLOOD PRESSURE IN LEFT ARM ONLY    Current Outpatient Prescriptions  Medication Sig Dispense Refill  . anastrozole (ARIMIDEX) 1 MG tablet TAKE 1 TABLET BY MOUTH EVERY DAY  90 tablet  0  . Ascorbic Acid (VITAMIN C) 1000 MG tablet Take 1,000 mg by mouth daily.      Marland Kitchen levothyroxine (SYNTHROID) 112 MCG tablet Take 112 mcg by mouth daily before breakfast. Sunday and Wednesday 1/2 tab, 1 tablet all other days.      . Vitamin D, Ergocalciferol, (DRISDOL) 50000 UNITS CAPS capsule TAKE 1 CAPSULE BY MOUTH EVERY 7 DAYS  12 capsule  0   No current facility-administered medications for this visit.    OBJECTIVE: Older white female woman . Who appears stated age  34 Vitals:   02/27/14 1403  BP: 162/68  Pulse: 74  Temp: 98.1 F (36.7 C)  Resp: 18     Body mass index is 33.66 kg/(m^2).      ECOG FS: 1 - Symptomatic but completely ambulatory  Sclerae unicteric,  EOMs intact  Oropharynx clear and moist--  teeth in good repair  No cervical or supraclavicular adenopathy Lungs no rales or rhonchi Heart regular rate and rhythm Abd soft, nontender, positive bowel sounds MSK no focal spinal tenderness, no upper extremity lymphedema Neuro: nonfocal, well oriented,  positive  affect Breasts: The right breast is status post lumpectomy and radiation. There is no evidence of local recurrence. The right axilla is benign. The left breast is unremarkable   LAB RESULTS: Lab Results  Component Value Date   WBC 5.8 02/19/2014   NEUTROABS 3.0 02/19/2014   HGB 14.2 02/19/2014   HCT 43.5 02/19/2014   MCV 96.0 02/19/2014   PLT 243 02/19/2014      Chemistry      Component Value Date/Time   NA 142 02/19/2014 0845   NA 140 10/04/2011 2159   K 4.1 02/19/2014 0845   K 4.0 10/04/2011 2159   CL  106 01/01/2013 0845   CL 105 10/04/2011 2159   CO2 23 02/19/2014 0845   CO2 26 10/04/2011 2159   BUN 17.6 02/19/2014 0845   BUN 21 10/04/2011 2159   CREATININE 0.7 02/19/2014 0845   CREATININE 0.60 10/04/2011 2159      Component Value Date/Time   CALCIUM 9.4 02/19/2014 0845   CALCIUM 9.4 10/04/2011 2159   ALKPHOS 80 02/19/2014 0845  ALKPHOS 102 10/04/2011 2159   AST 18 02/19/2014 0845   AST 14 10/04/2011 2159   ALT 16 02/19/2014 0845   ALT 11 10/04/2011 2159   BILITOT 0.56 02/19/2014 0845   BILITOT 0.1* 10/04/2011 2159      Lab Results  Component Value Date   LABCA2 18 06/25/2012    Urinalysis    Component Value Date/Time   COLORURINE YELLOW 09/14/2011 1013   APPEARANCEUR CLOUDY* 09/14/2011 1013   LABSPEC >1.030* 09/14/2011 1013   PHURINE 5.0 09/14/2011 1013   GLUCOSEU NEGATIVE 09/14/2011 1013   HGBUR NEGATIVE 09/14/2011 1013   BILIRUBINUR SMALL* 09/14/2011 1013   KETONESUR 15* 09/14/2011 1013   PROTEINUR NEGATIVE 09/14/2011 1013   UROBILINOGEN 0.2 09/14/2011 1013   NITRITE NEGATIVE 09/14/2011 1013   LEUKOCYTESUR NEGATIVE 09/14/2011 1013    STUDIES: Mm Digital Diagnostic Bilat  08/19/2013   CLINICAL DATA:  Patient presents for bilateral diagnostic mammogram due to a history of prior malignant right lumpectomy 2013 post radiation.  EXAM: DIGITAL DIAGNOSTIC BILATERAL MAMMOGRAM WITH 3D TOMO WITH CAD  COMPARISON:  Previous exams.  ACR Breast Density Category b: There are scattered areas of fibroglandular density.  FINDINGS: Examination demonstrates stable post lumpectomy changes of the upper-outer quadrant of the right breast. There is mild post radiation skin thickening present. Remainder of the exam is unchanged.  Mammographic images were processed with CAD.  IMPRESSION: Stable post lumpectomy changes of the right breast.  RECOMMENDATION: Recommend continued annual bilateral diagnostic mammographic evaluation.  I have discussed the findings and recommendations with the patient. Results were also provided in  writing at the conclusion of the visit.  BI-RADS CATEGORY  2: Benign Finding(s)   Electronically Signed   By: Marin Olp M.D.   On: 08/19/2013 11:24  .ASSESSMENT: 73 y.o. BRCA negative [place], [place] woman:  1.  Status post right breast core biopsy 08/31/2011 showing invasive ductal carcinoma, ER 100%, PR 100%, Ki-67 19%, HER-2/neu by CISH no amplification with a ratio 1.56.  2.  Status post right breast lumpectomy with right axillary sentinel node biopsy 09/14/2011 for a pT1b, pN0, stage IA invasive ductal carcinoma, grade 1, again HER-2/neu by CISH no amplification with ratio of 1.32  3. Status post radiation therapy from 10/18/2011 through 11/18/2011.  4. Started antiestrogen therapy with Arimidex in 10/2011.  5.  Osteopenia (last bone density scan on 08/09/2012 showed a T score of -1.6)    PLAN: . Nicha is doing fine as far as her breast cancer is concerned and she is tolerating the anastrozole without any unusual side effects. She is checking on the cost and will let us know if it is more than about $25 for 3 months.  She wants to postpone the zolendronate decision until she has her new bone density, which will be done in January. I have set up so she can have at the same day as her mammogram. She will have tomography with the mammogram as well.  Otherwise I have encouraged her to exercise more regularly. She is a member of the [employer] and has gone through [employer]. In addition she has a very good vitamin D level.  Kennidi has a good understanding of all this. She agrees with the plan. She knows the goal of treatment in her case is cure. She will call for any problems that may develop before her next visit here.  02/27/2014, 2:25 PM

## 2014-02-27 NOTE — Telephone Encounter (Signed)
per pof to sch pt appt/mamma-gave pt copy of sch °

## 2014-03-13 ENCOUNTER — Encounter: Payer: Self-pay | Admitting: Obstetrics & Gynecology

## 2014-03-13 ENCOUNTER — Ambulatory Visit (INDEPENDENT_AMBULATORY_CARE_PROVIDER_SITE_OTHER): Payer: Medicare Other | Admitting: Obstetrics & Gynecology

## 2014-03-13 VITALS — BP 128/84 | HR 68 | Resp 16 | Ht 64.25 in | Wt 204.2 lb

## 2014-03-13 DIAGNOSIS — Z124 Encounter for screening for malignant neoplasm of cervix: Secondary | ICD-10-CM | POA: Diagnosis not present

## 2014-03-13 DIAGNOSIS — Z01419 Encounter for gynecological examination (general) (routine) without abnormal findings: Secondary | ICD-10-CM

## 2014-03-13 MED ORDER — VITAMIN D (ERGOCALCIFEROL) 1.25 MG (50000 UNIT) PO CAPS: ORAL_CAPSULE | ORAL | Status: DC

## 2014-03-13 NOTE — Progress Notes (Addendum)
73 y.o. G1P1001 MarriedCaucasianF here for annual exam.  Doing really well.  Has a new granddaughter--Catherine.     Reports the last time she saw Dr. Jana Hakim, they discussed Tamoxifen due to change in BMD.  Pt had hx of simple endometrial hyperplasia treated with hysteroscopy/D&C 2000.  For now, staying on Arimidex.  Had BMD scheduled for 1/16.  If worse, will start bisphosphonate.  If needs to switch to Tamoxifen, I would recommend a yearly endometrial biopsy on patient.    Denies VB.    Dr. Osborne Casco checked labs.  This was all normal.     Patient's last menstrual period was 08/02/1995.          Sexually active: Asked, but does not feel is important The current method of family planning is post menopausal status.    Exercising: No.  not regularly Smoker:  no  Health Maintenance: Pap:  12/20/11 WNL History of abnormal Pap:  no MMG:  08/19/13-Diag yearly Colonoscopy:  2013-repeat in 10 years BMD:   1/14, -1.6, normal TDaP:  7/15 Screening Labs: PCP, Hb today: PCP, Urine today: PCP   reports that she has never smoked. She has never used smokeless tobacco. She reports that she does not drink alcohol or use illicit drugs.  Past Medical History  Diagnosis Date  . Endometrial hyperplasia, simple   . Endometrial polyp   . Hypothyroidism   . Basal cell carcinoma of neck        . Breast cancer 08/31/11    DCIS; right breast, ER/PR +  . Hx of radiation therapy 10/18/11 to 11/18/11    right breast  . Hx of radiation therapy 1957    4 tx to face for acne  . Metacarpal bone fracture 08/30/2012    Right 5th finger fracture  . H/O TB skin testing 1970's    + INH rx  . Breast cancer 2/13    9 MM R breast- E+/P+ HVR 2-  . Fracture     right arm    Past Surgical History  Procedure Laterality Date  . Endometrial polyp    . Tonsillectomy      adenoids  . Mastectomy, partial  09/14/11    right breast  . Hysteroscopy  12/00    polyps  . Breast lumpectomy  2/13    sentinel node     Current Outpatient Prescriptions  Medication Sig Dispense Refill  . anastrozole (ARIMIDEX) 1 MG tablet TAKE 1 TABLET BY MOUTH EVERY DAY  90 tablet  0  . Ascorbic Acid (VITAMIN C) 1000 MG tablet Take 1,000 mg by mouth daily.      Marland Kitchen levothyroxine (SYNTHROID) 112 MCG tablet Take 112 mcg by mouth daily before breakfast. Sunday and Wednesday 1/2 tab, 1 tablet all other days.      . Vitamin D, Ergocalciferol, (DRISDOL) 50000 UNITS CAPS capsule TAKE 1 CAPSULE BY MOUTH EVERY 7 DAYS  12 capsule  0   No current facility-administered medications for this visit.    Family History  Problem Relation Age of Onset  . Skin cancer Father 54  . Breast cancer Paternal Aunt     x 2  . Stomach cancer Paternal Grandmother 74  . Breast cancer Cousin     ages 70-60 at dx x 4 paternal  . Prostate cancer Cousin     ????  . Brain cancer Maternal Uncle     brain  . Cancer Cousin     tonsillar  . Colon cancer Neg Hx   .  Stroke Father   . Stroke Brother     ROS:  Pertinent items are noted in HPI.  Otherwise, a comprehensive ROS was negative.  Exam:   BP 128/84  Pulse 68  Resp 16  Ht 5' 4.25" (1.632 m)  Wt 204 lb 3.2 oz (92.625 kg)  BMI 34.78 kg/m2  LMP 08/02/1995  Weight change: Height: 5' 4.25" (163.2 cm)  Ht Readings from Last 3 Encounters:  03/13/14 5' 4.25" (1.632 m)  02/27/14 5\' 5"  (1.651 m)  08/22/13 5\' 5"  (1.651 m)    General appearance: alert, cooperative and appears stated age Head: Normocephalic, without obvious abnormality, atraumatic Neck: no adenopathy, supple, symmetrical, trachea midline and thyroid normal to inspection and palpation Lungs: clear to auscultation bilaterally Breasts: normal appearance, no masses or tenderness Heart: regular rate and rhythm Abdomen: soft, non-tender; bowel sounds normal; no masses,  no organomegaly Extremities: extremities normal, atraumatic, no cyanosis or edema Skin: Skin color, texture, turgor normal. No rashes or lesions Lymph nodes:  Cervical, supraclavicular, and axillary nodes normal. No abnormal inguinal nodes palpated Neurologic: Grossly normal   Pelvic: External genitalia:  no lesions              Urethra:  normal appearing urethra with no masses, tenderness or lesions              Bartholins and Skenes: normal                 Vagina: normal appearing vagina with normal color and discharge, no lesions              Cervix: no lesions              Pap taken: Yes.   Bimanual Exam:  Uterus:  normal size, contour, position, consistency, mobility, non-tender              Adnexa: normal adnexa and no mass, fullness, tenderness               Rectovaginal: Confirms               Anus:  normal sphincter tone, no lesions  A:  Well Woman with normal exam  H/o 1a breast cancer, Er/PR +, s/p lumpectomy and radiation  Remote hx of endometrial hyperplasia 2000 Osteopenia, wrist fx 2/14 from fall  Vit D deficiency Hypothyroidism    P: Mammogram yearly. Does diagnostic mmg yearly.  Pap today BMD 1/16.  Rx for Vit D 50,000 IU to pharmacy for one year.  Vit d was 39 on 7/22/ at Cancer center. return annually or prn     An After Visit Summary was printed and given to the patient.   From Dr. Jana Hakim:    We have no randomized data for aromatase inhibitors afer 5 years. There is a completed study randomly assigning pts after 5 years on an AI to another 5 years vs observation. That study has not been reported yet.   I normally stop AIs after 5 years on stage I-II pats but sometimes continue to 10 years on stage II-III patients (until we have data NOT to do it).   I would encourage patients to go off AIs after 10 years and "get out pf the cancer business", as one of my patients put it. If they really want to continue, I don't have any data to say that is wrong; I would just optimize their osteoporosis prevention and vaginal atrophy plans   Hope this helps!

## 2014-03-17 LAB — IPS PAP SMEAR ONLY

## 2014-04-15 ENCOUNTER — Telehealth: Payer: Self-pay

## 2014-04-15 NOTE — Telephone Encounter (Signed)
Message copied by Robley Fries on Tue Apr 15, 2014  4:34 PM ------      Message from: Megan Salon      Created: Mon Apr 14, 2014 10:40 AM      Regarding: information from Dr. Monica Martinez,      I saw Mrs. Prettyman a few weeks ago.  She had questions about continuing her Arimidex.  BMD had changed some and so Dr. Jana Hakim discussed started on Tamoxifen.  Let her know I've communicated with Dr. Jana Hakim.  For now, his plan will be to continue the Arimidex 5 years as long as her bones don't change too much.  Next BMD will be in January, 2016.  Please let he know that I want her to call if Dr. Jana Hakim ever decides to put her on Tamoxifen.  Thanks. ------

## 2014-04-15 NOTE — Telephone Encounter (Signed)
Patient aware and will notify Dr Sabra Heck if any changes.//kn

## 2014-04-23 DIAGNOSIS — Z23 Encounter for immunization: Secondary | ICD-10-CM | POA: Diagnosis not present

## 2014-04-25 ENCOUNTER — Other Ambulatory Visit: Payer: Self-pay | Admitting: Adult Health

## 2014-04-25 DIAGNOSIS — C50919 Malignant neoplasm of unspecified site of unspecified female breast: Secondary | ICD-10-CM

## 2014-05-10 ENCOUNTER — Encounter (HOSPITAL_BASED_OUTPATIENT_CLINIC_OR_DEPARTMENT_OTHER): Payer: Self-pay | Admitting: Emergency Medicine

## 2014-05-10 ENCOUNTER — Emergency Department (HOSPITAL_BASED_OUTPATIENT_CLINIC_OR_DEPARTMENT_OTHER)
Admission: EM | Admit: 2014-05-10 | Discharge: 2014-05-10 | Disposition: A | Discharge status: Home / Self Care | Payer: Medicare Other | Attending: Emergency Medicine | Admitting: Emergency Medicine

## 2014-05-10 DIAGNOSIS — E039 Hypothyroidism, unspecified: Secondary | ICD-10-CM | POA: Diagnosis not present

## 2014-05-10 DIAGNOSIS — Z85828 Personal history of other malignant neoplasm of skin: Secondary | ICD-10-CM | POA: Insufficient documentation

## 2014-05-10 DIAGNOSIS — Z8781 Personal history of (healed) traumatic fracture: Secondary | ICD-10-CM | POA: Insufficient documentation

## 2014-05-10 DIAGNOSIS — Z23 Encounter for immunization: Secondary | ICD-10-CM | POA: Diagnosis not present

## 2014-05-10 DIAGNOSIS — S61452A Open bite of left hand, initial encounter: Secondary | ICD-10-CM | POA: Diagnosis not present

## 2014-05-10 DIAGNOSIS — S61402A Unspecified open wound of left hand, initial encounter: Secondary | ICD-10-CM | POA: Diagnosis not present

## 2014-05-10 DIAGNOSIS — Z8742 Personal history of other diseases of the female genital tract: Secondary | ICD-10-CM | POA: Insufficient documentation

## 2014-05-10 DIAGNOSIS — Y939 Activity, unspecified: Secondary | ICD-10-CM | POA: Insufficient documentation

## 2014-05-10 DIAGNOSIS — Z79899 Other long term (current) drug therapy: Secondary | ICD-10-CM | POA: Diagnosis not present

## 2014-05-10 DIAGNOSIS — W5501XA Bitten by cat, initial encounter: Secondary | ICD-10-CM | POA: Diagnosis not present

## 2014-05-10 DIAGNOSIS — Z923 Personal history of irradiation: Secondary | ICD-10-CM | POA: Diagnosis not present

## 2014-05-10 DIAGNOSIS — Z853 Personal history of malignant neoplasm of breast: Secondary | ICD-10-CM | POA: Diagnosis not present

## 2014-05-10 DIAGNOSIS — Y9289 Other specified places as the place of occurrence of the external cause: Secondary | ICD-10-CM | POA: Insufficient documentation

## 2014-05-10 MED ORDER — AMOXICILLIN-POT CLAVULANATE 875-125 MG PO TABS: 1.0000 | ORAL_TABLET | Freq: Two times a day (BID) | ORAL | Status: DC

## 2014-05-10 MED ORDER — TETANUS-DIPHTH-ACELL PERTUSSIS 5-2.5-18.5 LF-MCG/0.5 IM SUSP
0.5000 mL | Freq: Once | INTRAMUSCULAR | Status: AC
  Administered 2014-05-10: 0.5 mL via INTRAMUSCULAR
  Filled 2014-05-10: qty 0.5

## 2014-05-10 NOTE — ED Provider Notes (Signed)
History/physical exam/procedure(s) were performed by non-physician practitioner and as supervising physician I was immediately available for consultation/collaboration. I have reviewed all notes and am in agreement with care and plan.   Shaune Pollack, MD 05/10/14 [phone]

## 2014-05-10 NOTE — ED Provider Notes (Signed)
CSN: [id number]     Arrival date & time 05/10/14  2042 History   First MD Initiated Contact with Patient 05/10/14 2103     Chief Complaint  Patient presents with  . Animal Bite     (Consider location/radiation/quality/duration/timing/severity/associated sxs/prior Treatment) Patient is a 73 y.o. female presenting with animal bite. The history is provided by the patient. No language interpreter was used.  Animal Bite Contact animal:  Cat Location:  Hand Hand injury location:  L hand Time since incident:  1 hour Pain details:    Quality:  Sore   Severity:  No pain Incident location:  Another residence Provoked: unprovoked   Notifications:  None Animal's rabies vaccination status:  Up to date Animal in possession: yes   Tetanus status:  Out of date Worsened by:  Nothing tried Ineffective treatments:  None tried Pt was bitten by her son's cat.   Animal is immunized.   Past Medical History  Diagnosis Date  . Endometrial hyperplasia, simple   . Endometrial polyp   . Hypothyroidism   . Basal cell carcinoma of neck        . Breast cancer 08/31/11    DCIS; right breast, ER/PR +  . Hx of radiation therapy 10/18/11 to 11/18/11    right breast  . Hx of radiation therapy 1957    4 tx to face for acne  . Metacarpal bone fracture 08/30/2012    Right 5th finger fracture  . H/O TB skin testing 1970's    + INH rx  . Breast cancer 2/13    9 MM R breast- E+/P+ HVR 2-  . Fracture     right arm   Past Surgical History  Procedure Laterality Date  . Endometrial polyp    . Tonsillectomy      adenoids  . Mastectomy, partial  09/14/11    right breast  . Hysteroscopy  12/00    polyps  . Breast lumpectomy  2/13    sentinel node   Family History  Problem Relation Age of Onset  . Skin cancer Father 16  . Breast cancer Paternal Aunt     x 2  . Stomach cancer Paternal Grandmother 42  . Breast cancer Cousin     ages 41-60 at dx x 4 paternal  . Prostate cancer Cousin     ????  .  Brain cancer Maternal Uncle     brain  . Cancer Cousin     tonsillar  . Colon cancer Neg Hx   . Stroke Father   . Stroke Brother    History  Substance Use Topics  . Smoking status: Never Smoker   . Smokeless tobacco: Never Used  . Alcohol Use: No   OB History   Grav Para Term Preterm Abortions TAB SAB Ect Mult Living   1 1 1       1      Obstetric Comments   Menarche age 38, menopause 55, HRT x 2-3 yrs     Review of Systems  Skin: Positive for wound.  All other systems reviewed and are negative.     Allergies  Other  Home Medications   Prior to Admission medications   Medication Sig Start Date End Date Taking? Authorizing Provider  amoxicillin-clavulanate (AUGMENTIN) 875-125 MG per tablet Take 1 tablet by mouth every 12 (twelve) hours. 05/10/14   Fransico Meadow, PA-C  anastrozole (ARIMIDEX) 1 MG tablet TAKE 1 TABLET BY MOUTH EVERY DAY 04/25/14   Virgie Dad [place],  MD  Ascorbic Acid (VITAMIN C) 1000 MG tablet Take 1,000 mg by mouth daily.    Historical Provider, MD  levothyroxine (SYNTHROID) 112 MCG tablet Take 112 mcg by mouth daily before breakfast. Sunday and Wednesday 1/2 tab, 1 tablet all other days.    Historical Provider, MD  Vitamin D, Ergocalciferol, (DRISDOL) 50000 UNITS CAPS capsule TAKE 1 CAPSULE BY MOUTH EVERY 7 DAYS 03/13/14   Lyman Speller, MD   BP 154/78  Pulse 84  Temp(Src) 97.7 F (36.5 C) (Oral)  Resp 18  Wt 204 lb (92.534 kg)  SpO2 99%  LMP 08/02/1995 Physical Exam  Nursing note and vitals reviewed. Constitutional: She is oriented to person, place, and time. She appears well-developed and well-nourished.  Musculoskeletal: She exhibits tenderness.  Puncture wound hand.  Neurological: She is alert and oriented to person, place, and time. She has normal reflexes.  Skin: Skin is warm.  Psychiatric: She has a normal mood and affect.    ED Course  Procedures (including critical care time) Labs Review Labs Reviewed - No data to  display  Imaging Review No results found.   EKG Interpretation None      MDM   Final diagnoses:  Animal bite of hand, left, initial encounter    Augmentin TD   Fransico Meadow, PA-C 05/10/14 2156

## 2014-05-10 NOTE — Discharge Instructions (Signed)

## 2014-05-10 NOTE — ED Notes (Signed)
Patient here with left hand cat puncture from friends cat this evening, small red area noted to left hand, denies any problems. Here for check

## 2014-06-02 ENCOUNTER — Encounter (HOSPITAL_BASED_OUTPATIENT_CLINIC_OR_DEPARTMENT_OTHER): Payer: Self-pay | Admitting: Emergency Medicine

## 2014-06-11 DIAGNOSIS — D229 Melanocytic nevi, unspecified: Secondary | ICD-10-CM | POA: Diagnosis not present

## 2014-06-11 DIAGNOSIS — L719 Rosacea, unspecified: Secondary | ICD-10-CM | POA: Diagnosis not present

## 2014-06-11 DIAGNOSIS — Z85828 Personal history of other malignant neoplasm of skin: Secondary | ICD-10-CM | POA: Diagnosis not present

## 2014-06-11 DIAGNOSIS — L821 Other seborrheic keratosis: Secondary | ICD-10-CM | POA: Diagnosis not present

## 2014-06-11 DIAGNOSIS — L570 Actinic keratosis: Secondary | ICD-10-CM | POA: Diagnosis not present

## 2014-06-24 DIAGNOSIS — Z853 Personal history of malignant neoplasm of breast: Secondary | ICD-10-CM | POA: Diagnosis not present

## 2014-06-30 DIAGNOSIS — L570 Actinic keratosis: Secondary | ICD-10-CM | POA: Diagnosis not present

## 2014-07-08 DIAGNOSIS — H25013 Cortical age-related cataract, bilateral: Secondary | ICD-10-CM | POA: Diagnosis not present

## 2014-07-08 DIAGNOSIS — H2513 Age-related nuclear cataract, bilateral: Secondary | ICD-10-CM | POA: Diagnosis not present

## 2014-07-29 ENCOUNTER — Telehealth: Payer: Self-pay | Admitting: Oncology

## 2014-07-29 NOTE — Telephone Encounter (Signed)
S/w pt advised appt chg from 1/27 (md pal) to 2/10 @ 1.45 with HF. Pt verbalized understanding.

## 2014-08-19 ENCOUNTER — Other Ambulatory Visit: Payer: Self-pay | Admitting: Hematology and Oncology

## 2014-08-19 ENCOUNTER — Other Ambulatory Visit: Payer: Self-pay | Admitting: *Deleted

## 2014-08-19 DIAGNOSIS — C50411 Malignant neoplasm of upper-outer quadrant of right female breast: Secondary | ICD-10-CM

## 2014-08-20 ENCOUNTER — Other Ambulatory Visit (HOSPITAL_BASED_OUTPATIENT_CLINIC_OR_DEPARTMENT_OTHER): Payer: Medicare Other

## 2014-08-20 ENCOUNTER — Ambulatory Visit
Admission: RE | Admit: 2014-08-20 | Discharge: 2014-08-20 | Disposition: A | Discharge status: Home / Self Care | Payer: Medicare Other | Source: Ambulatory Visit | Attending: Oncology | Admitting: Oncology

## 2014-08-20 DIAGNOSIS — C50912 Malignant neoplasm of unspecified site of left female breast: Secondary | ICD-10-CM

## 2014-08-20 DIAGNOSIS — Z780 Asymptomatic menopausal state: Secondary | ICD-10-CM | POA: Diagnosis not present

## 2014-08-20 DIAGNOSIS — M8580 Other specified disorders of bone density and structure, unspecified site: Secondary | ICD-10-CM

## 2014-08-20 DIAGNOSIS — N6459 Other signs and symptoms in breast: Secondary | ICD-10-CM | POA: Diagnosis not present

## 2014-08-20 DIAGNOSIS — M8588 Other specified disorders of bone density and structure, other site: Secondary | ICD-10-CM | POA: Diagnosis not present

## 2014-08-20 DIAGNOSIS — Z853 Personal history of malignant neoplasm of breast: Secondary | ICD-10-CM | POA: Diagnosis not present

## 2014-08-20 DIAGNOSIS — M85852 Other specified disorders of bone density and structure, left thigh: Secondary | ICD-10-CM | POA: Diagnosis not present

## 2014-08-20 DIAGNOSIS — C50411 Malignant neoplasm of upper-outer quadrant of right female breast: Secondary | ICD-10-CM

## 2014-08-20 LAB — COMPREHENSIVE METABOLIC PANEL (CC13)
ALT: 15 U/L (ref 0–55)
ANION GAP: 9 meq/L (ref 3–11)
AST: 21 U/L (ref 5–34)
Albumin: 4 g/dL (ref 3.5–5.0)
Alkaline Phosphatase: 95 U/L (ref 40–150)
BILIRUBIN TOTAL: 0.43 mg/dL (ref 0.20–1.20)
BUN: 12.5 mg/dL (ref 7.0–26.0)
CALCIUM: 9.1 mg/dL (ref 8.4–10.4)
CHLORIDE: 107 meq/L (ref 98–109)
CO2: 25 meq/L (ref 22–29)
Creatinine: 0.7 mg/dL (ref 0.6–1.1)
EGFR: 84 mL/min/{1.73_m2} — AB (ref >90)
Glucose: 99 mg/dl (ref 70–140)
Potassium: 4.6 mEq/L (ref 3.5–5.1)
Sodium: 141 mEq/L (ref 136–145)
Total Protein: 6.7 g/dL (ref 6.4–8.3)

## 2014-08-20 LAB — CBC WITH DIFFERENTIAL/PLATELET
BASO%: 0.3 % (ref 0.0–2.0)
Basophils Absolute: 0 10*3/uL (ref 0.0–0.1)
EOS ABS: 0.2 10*3/uL (ref 0.0–0.5)
EOS%: 3.7 % (ref 0.0–7.0)
HEMATOCRIT: 44.9 % (ref 34.8–46.6)
HGB: 14.5 g/dL (ref 11.6–15.9)
LYMPH%: 31.8 % (ref 14.0–49.7)
MCH: 31 pg (ref 25.1–34.0)
MCHC: 32.3 g/dL (ref 31.5–36.0)
MCV: 96.2 fL (ref 79.5–101.0)
MONO#: 0.6 10*3/uL (ref 0.1–0.9)
MONO%: 11.2 % (ref 0.0–14.0)
NEUT%: 53 % (ref 38.4–76.8)
NEUTROS ABS: 2.7 10*3/uL (ref 1.5–6.5)
PLATELETS: 245 10*3/uL (ref 145–400)
RBC: 4.66 10*6/uL (ref 3.70–5.45)
RDW: 13.3 % (ref 11.2–14.5)
WBC: 5.2 10*3/uL (ref 3.9–10.3)
lymph#: 1.6 10*3/uL (ref 0.9–3.3)

## 2014-08-27 ENCOUNTER — Ambulatory Visit: Payer: Medicare Other | Admitting: Oncology

## 2014-09-02 ENCOUNTER — Telehealth: Payer: Self-pay | Admitting: Nurse Practitioner

## 2014-09-02 NOTE — Telephone Encounter (Signed)
peR hf TO MOVE TO 2/17 PER pal-CLD & SPOKE TOPT PT REFUSED APPT INSISITED ON SEEING GM-R/S FOR 4/7 GAVE PT TIME & DATE-PT Jamie Cole

## 2014-09-08 DIAGNOSIS — L570 Actinic keratosis: Secondary | ICD-10-CM | POA: Diagnosis not present

## 2014-09-10 ENCOUNTER — Ambulatory Visit: Payer: Medicare Other | Admitting: Nurse Practitioner

## 2014-09-17 ENCOUNTER — Ambulatory Visit: Payer: Medicare Other | Admitting: Nurse Practitioner

## 2014-10-17 DIAGNOSIS — H1789 Other corneal scars and opacities: Secondary | ICD-10-CM | POA: Diagnosis not present

## 2014-10-17 DIAGNOSIS — H2513 Age-related nuclear cataract, bilateral: Secondary | ICD-10-CM | POA: Diagnosis not present

## 2014-10-17 DIAGNOSIS — H18451 Nodular corneal degeneration, right eye: Secondary | ICD-10-CM | POA: Diagnosis not present

## 2014-10-17 DIAGNOSIS — H1859 Other hereditary corneal dystrophies: Secondary | ICD-10-CM | POA: Diagnosis not present

## 2014-10-18 ENCOUNTER — Other Ambulatory Visit: Payer: Self-pay | Admitting: Oncology

## 2014-11-06 ENCOUNTER — Telehealth: Payer: Self-pay | Admitting: Oncology

## 2014-11-06 ENCOUNTER — Ambulatory Visit (HOSPITAL_BASED_OUTPATIENT_CLINIC_OR_DEPARTMENT_OTHER): Payer: Medicare Other | Admitting: Oncology

## 2014-11-06 VITALS — BP 131/63 | HR 78 | Temp 98.7°F | Resp 18 | Ht 64.25 in | Wt 192.1 lb

## 2014-11-06 DIAGNOSIS — C50919 Malignant neoplasm of unspecified site of unspecified female breast: Secondary | ICD-10-CM

## 2014-11-06 DIAGNOSIS — Z170 Estrogen receptor positive status [ER+]: Secondary | ICD-10-CM | POA: Diagnosis not present

## 2014-11-06 DIAGNOSIS — M8580 Other specified disorders of bone density and structure, unspecified site: Secondary | ICD-10-CM

## 2014-11-06 DIAGNOSIS — C50411 Malignant neoplasm of upper-outer quadrant of right female breast: Secondary | ICD-10-CM | POA: Diagnosis not present

## 2014-11-06 NOTE — Progress Notes (Signed)
[facility]  Telephone:([phone]) [phone] [phone]) [phone]  OFFICE PROGRESS NOTE   ID: Jamie Cole   DOB: 29-Nov-1940  MR#: [id number]  HTD#:[id number]   PCP: [name], MD GYN:  SUFanny Skates, M.D. RAD ONC: Rexene Edison, M.D.  CHIEF COMPLAINT: Estrogen receptor positive her early stage breast cancer  CURRENT TREATMENT: Anastrozole   BREAST CANCER ISTORY From Dr. Julien Girt new patient evaluation note dated 09/07/2011:  "This is a delightful 74 year old woman here today with her family for discussion of her recent diagnosis of breast cancer. She does undergo annual screening mammography. She did not take any abnormalities in her breast. A screening mammogram 08/15/2011 showed a possible mass in the right breast additional views were recommended. Last mammogram right breast ultrasound performed 08/31/2011 showed a suspicious 5 x 6 x 6 mm mass in the upper outer right breast with calcifications and a biopsy was recommended. Biopsy. performed 08/31/2011 demonstrated a invasive ductal cancer likely grade 2 HER-2 was negative the ratio 1.56 the tumor was strongly ER and PR positive. An MRI scans performed to 09/06/11 showed a mass in the right breast measuring 9 x 9 x 8 mm. No other lesions were seen. There is no other evidence of adenopathy. A 1 cm lesion in the left lobe of the liver was seen likely a cyst."    Her subsequent history is as detailed below.    INTERVAL HISTORY: Jamie Cole returns today for followup of her breast cancer accompanied by her husband Jamie Cole. She continues on anastrozole with excellent tolerance. Hot flashes, vaginal dryness, and arthralgias and myalgias are not a problem. She obtains the medication at a very good price  REVIEW OF SYSTEMS: A detailed review of systems today was otherwise noncontributory  PAST MEDICAL HISTORY: Past Medical History  Diagnosis Date  . Endometrial hyperplasia, simple   . Endometrial polyp   . Hypothyroidism   .  Basal cell carcinoma of neck        . Breast cancer 08/31/11    DCIS; right breast, ER/PR +  . Hx of radiation therapy 10/18/11 to 11/18/11    right breast  . Hx of radiation therapy 1957    4 tx to face for acne  . Metacarpal bone fracture 08/30/2012    Right 5th finger fracture  . H/O TB skin testing 1970's    + INH rx  . Breast cancer 2/13    9 MM R breast- E+/P+ HVR 2-  . Fracture     right arm    PAST SURGICAL HISTORY: Past Surgical History  Procedure Laterality Date  . Endometrial polyp    . Tonsillectomy      adenoids  . Mastectomy, partial  09/14/11    right breast  . Hysteroscopy  12/00    polyps  . Breast lumpectomy  2/13    sentinel node    FAMILY HISTORY Family History  Problem Relation Age of Onset  . Skin cancer Father 85  . Breast cancer Paternal Aunt     x 2  . Stomach cancer Paternal Grandmother 7  . Breast cancer Cousin     ages 90-60 at dx x 4 paternal  . Prostate cancer Cousin     ????  . Brain cancer Maternal Uncle     brain  . Cancer Cousin     tonsillar  . Colon cancer Neg Hx   . Stroke Father   . Stroke Brother     GYNECOLOGIC HISTORY: G1P1, menarche 77,  menopause 53, HRT x 2-3y w/o unusual complications  SOCIAL HISTORY: The patient and her husband Jamie Cole have been married since 1973.  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 Patient's son is an Forensic psychologist in [facility] and is a Occupational psychologist. The patient has no grandchildren but 4 grandcats. In her spare time the patient enjoys traveling, visiting with friends and family and crocheting.   ADVANCED DIRECTIVES: in place  HEALTH MAINTENANCE: History  Substance Use Topics  . Smoking status: Never Smoker   . Smokeless tobacco: Never Used  . Alcohol Use: No    Colonoscopy: 09/2011 PAP: 11/2011 Bone density: showed osteopenia with T -1.9 Lipid panel:  Not on file  Allergies  Allergen Reactions  . Other     PLEASE DO BLOOD PRESSURE IN LEFT ARM  ONLY    Current Outpatient Prescriptions  Medication Sig Dispense Refill  . amoxicillin-clavulanate (AUGMENTIN) 875-125 MG per tablet Take 1 tablet by mouth every 12 (twelve) hours. 14 tablet 0  . anastrozole (ARIMIDEX) 1 MG tablet TAKE 1 TABLET BY MOUTH DAILY 90 tablet 0  . Ascorbic Acid (VITAMIN C) 1000 MG tablet Take 1,000 mg by mouth daily.    Marland Kitchen levothyroxine (SYNTHROID) 112 MCG tablet Take 112 mcg by mouth daily before breakfast. Sunday and Wednesday 1/2 tab, 1 tablet all other days.    . Vitamin D, Ergocalciferol, (DRISDOL) 50000 UNITS CAPS capsule TAKE 1 CAPSULE BY MOUTH EVERY 7 DAYS 12 capsule 4   No current facility-administered medications for this visit.    OBJECTIVE: Older white female woman in no acute distress  Filed Vitals:   11/06/14 1119  BP: 131/63  Pulse: 78  Temp: 98.7 F (37.1 C)  Resp: 18     Body mass index is 32.72 kg/(m^2).      ECOG FS: 1 - Symptomatic but completely ambulatory  Sclerae unicteric, pupils round and equal Oropharynx clear, dentition in good repair No cervical or supraclavicular adenopathy Lungs no rales or rhonchi Heart regular rate and rhythm Abd soft, obese, nontender, positive bowel sounds MSK no focal spinal tenderness Neuro: nonfocal, well oriented, appropriate affect Breasts: The right breast is status post lumpectomy. There is no evidence of local recurrence. The right axilla is benign. The left breast is unremarkable    LAB RESULTS: Lab Results  Component Value Date   WBC 5.2 08/20/2014   NEUTROABS 2.7 08/20/2014   HGB 14.5 08/20/2014   HCT 44.9 08/20/2014   MCV 96.2 08/20/2014   PLT 245 08/20/2014      Chemistry      Component Value Date/Time   NA 141 08/20/2014 0807   NA 140 10/04/2011 2159   K 4.6 08/20/2014 0807   K 4.0 10/04/2011 2159   CL 106 01/01/2013 0845   CL 105 10/04/2011 2159   CO2 25 08/20/2014 0807   CO2 26 10/04/2011 2159   BUN 12.5 08/20/2014 0807   BUN 21 10/04/2011 2159   CREATININE 0.7  08/20/2014 0807   CREATININE 0.60 10/04/2011 2159      Component Value Date/Time   CALCIUM 9.1 08/20/2014 0807   CALCIUM 9.4 10/04/2011 2159   ALKPHOS 95 08/20/2014 0807   ALKPHOS 102 10/04/2011 2159   AST 21 08/20/2014 0807   AST 14 10/04/2011 2159   ALT 15 08/20/2014 0807   ALT 11 10/04/2011 2159   BILITOT 0.43 08/20/2014 0807   BILITOT 0.1* 10/04/2011 2159      Lab Results  Component  Value Date   LABCA2 18 06/25/2012    Urinalysis    Component Value Date/Time   COLORURINE YELLOW 09/14/2011 1013   APPEARANCEUR CLOUDY* 09/14/2011 1013   LABSPEC >1.030* 09/14/2011 1013   PHURINE 5.0 09/14/2011 1013   GLUCOSEU NEGATIVE 09/14/2011 1013   HGBUR NEGATIVE 09/14/2011 1013   BILIRUBINUR SMALL* 09/14/2011 1013   KETONESUR 15* 09/14/2011 1013   PROTEINUR NEGATIVE 09/14/2011 1013   UROBILINOGEN 0.2 09/14/2011 1013   NITRITE NEGATIVE 09/14/2011 1013   LEUKOCYTESUR NEGATIVE 09/14/2011 1013    STUDIES: CLINICAL DATA: 74 year old postmenopausal female.  EXAM: DUAL X-RAY ABSORPTIOMETRY (DXA) FOR BONE MINERAL DENSITY  FINDINGS: AP LUMBAR SPINE L1-L4  Bone Mineral Density (BMD): 0.842 g/cm2  Young Adult T-Score: -1.9  Z-Score: 0.6  LEFT FEMUR NECK  Bone Mineral Density (BMD): 0.724 g/cm2  Young Adult T-Score: -1.1  Z-Score: 1.0  ASSESSMENT: Patient's diagnostic category is low bone mass by WHO Criteria.  FRACTURE RISK: Moderate  FRAX: Based on the Irving model, the 10 year probability of a major osteoporotic fracture is 9.6%. The 10 year probability of a hip fracture is 1.5%.  COMPARISON: When compared to baseline evaluation 06/13/2001 there has been a statistically significant interval decrease in bone mineral density of the lumbar spine of 6%. When compared to prior evaluation 08/09/2012 there has been a statistically significant interval decrease in bone mineral density of the lumbar spine  of 3.4%.  .ASSESSMENT: 74 y.o. BRCA negative [place], [place] woman:  1.  Status post right breast core biopsy 08/31/2011 showing invasive ductal carcinoma, ER 100%, PR 100%, Ki-67 19%, HER-2/neu by CISH no amplification with a ratio 1.56.  2.  Status post right breast lumpectomy with right axillary sentinel node biopsy 09/14/2011 for a pT1b, pN0, stage IA invasive ductal carcinoma, grade 1, again HER-2/neu by CISH no amplification with ratio of 1.32  3. Status post radiation therapy from 10/18/2011 through 11/18/2011.  4. Started antiestrogen therapy with Arimidex in 10/2011.  5.  Osteopenia: Bone scan January 2016 shows a T score of -1.9  PLAN: As far as her breast cancer is concerned Khylie is doing terrific. The plan is to continue anastrozole for an additional 2 years.  She has lost some more a bone density. She understands that this increases the fracture risk. We discussed Fosamax, Boniva, zolendronate, or denosumab. At this point she wants to think about those options. She will discuss them further with Dr. Stephens Shire back and if she decides to do something she can do it through his office or she will let us know if she wants to do it through this office  I am starting to see her on a once a year basis and after 2 years we will release her from follow-up. She would be a good candidate for survivorship clinic and I will see if she can get an appointment for October this year.  She knows to call for any problems that may develop before the next visit here.  11/06/2014, 11:26 AM

## 2014-11-06 NOTE — Telephone Encounter (Signed)
per pof to sch pt appt-sch pt appt and gave pt copy of sch

## 2014-11-13 DIAGNOSIS — H2511 Age-related nuclear cataract, right eye: Secondary | ICD-10-CM | POA: Diagnosis not present

## 2014-11-13 DIAGNOSIS — H02839 Dermatochalasis of unspecified eye, unspecified eyelid: Secondary | ICD-10-CM | POA: Diagnosis not present

## 2014-11-13 DIAGNOSIS — H18411 Arcus senilis, right eye: Secondary | ICD-10-CM | POA: Diagnosis not present

## 2014-11-13 DIAGNOSIS — H2512 Age-related nuclear cataract, left eye: Secondary | ICD-10-CM | POA: Diagnosis not present

## 2014-11-18 DIAGNOSIS — R739 Hyperglycemia, unspecified: Secondary | ICD-10-CM | POA: Diagnosis not present

## 2014-11-18 DIAGNOSIS — M859 Disorder of bone density and structure, unspecified: Secondary | ICD-10-CM | POA: Diagnosis not present

## 2014-11-18 DIAGNOSIS — E785 Hyperlipidemia, unspecified: Secondary | ICD-10-CM | POA: Diagnosis not present

## 2014-11-18 DIAGNOSIS — R390 Extravasation of urine: Secondary | ICD-10-CM | POA: Diagnosis not present

## 2014-11-18 DIAGNOSIS — R8290 Unspecified abnormal findings in urine: Secondary | ICD-10-CM | POA: Diagnosis not present

## 2014-11-18 DIAGNOSIS — E039 Hypothyroidism, unspecified: Secondary | ICD-10-CM | POA: Diagnosis not present

## 2014-11-25 DIAGNOSIS — Z0000 Encounter for general adult medical examination without abnormal findings: Secondary | ICD-10-CM | POA: Diagnosis not present

## 2014-11-25 DIAGNOSIS — D0590 Unspecified type of carcinoma in situ of unspecified breast: Secondary | ICD-10-CM | POA: Diagnosis not present

## 2014-11-25 DIAGNOSIS — M859 Disorder of bone density and structure, unspecified: Secondary | ICD-10-CM | POA: Diagnosis not present

## 2014-11-25 DIAGNOSIS — E669 Obesity, unspecified: Secondary | ICD-10-CM | POA: Diagnosis not present

## 2014-11-25 DIAGNOSIS — Z1389 Encounter for screening for other disorder: Secondary | ICD-10-CM | POA: Diagnosis not present

## 2014-11-25 DIAGNOSIS — Z6832 Body mass index (BMI) 32.0-32.9, adult: Secondary | ICD-10-CM | POA: Diagnosis not present

## 2014-11-25 DIAGNOSIS — E785 Hyperlipidemia, unspecified: Secondary | ICD-10-CM | POA: Diagnosis not present

## 2014-11-25 DIAGNOSIS — M160 Bilateral primary osteoarthritis of hip: Secondary | ICD-10-CM | POA: Diagnosis not present

## 2014-11-25 DIAGNOSIS — E039 Hypothyroidism, unspecified: Secondary | ICD-10-CM | POA: Diagnosis not present

## 2014-12-04 DIAGNOSIS — Z1212 Encounter for screening for malignant neoplasm of rectum: Secondary | ICD-10-CM | POA: Diagnosis not present

## 2014-12-15 DIAGNOSIS — H04122 Dry eye syndrome of left lacrimal gland: Secondary | ICD-10-CM | POA: Diagnosis not present

## 2014-12-15 DIAGNOSIS — H18451 Nodular corneal degeneration, right eye: Secondary | ICD-10-CM | POA: Diagnosis not present

## 2014-12-15 DIAGNOSIS — H04121 Dry eye syndrome of right lacrimal gland: Secondary | ICD-10-CM | POA: Diagnosis not present

## 2014-12-15 DIAGNOSIS — H1859 Other hereditary corneal dystrophies: Secondary | ICD-10-CM | POA: Diagnosis not present

## 2015-01-17 ENCOUNTER — Other Ambulatory Visit: Payer: Self-pay | Admitting: Oncology

## 2015-02-16 ENCOUNTER — Encounter: Payer: Self-pay | Admitting: Genetic Counselor

## 2015-03-22 DIAGNOSIS — B029 Zoster without complications: Secondary | ICD-10-CM | POA: Diagnosis not present

## 2015-03-22 DIAGNOSIS — R21 Rash and other nonspecific skin eruption: Secondary | ICD-10-CM | POA: Diagnosis not present

## 2015-03-22 DIAGNOSIS — Z6832 Body mass index (BMI) 32.0-32.9, adult: Secondary | ICD-10-CM | POA: Diagnosis not present

## 2015-04-11 ENCOUNTER — Other Ambulatory Visit: Payer: Self-pay | Admitting: Oncology

## 2015-04-14 ENCOUNTER — Telehealth: Payer: Self-pay | Admitting: Oncology

## 2015-04-14 NOTE — Telephone Encounter (Signed)
Left message for patient re 10/3 LTS f/u. Schedule mailed.

## 2015-04-17 ENCOUNTER — Ambulatory Visit (INDEPENDENT_AMBULATORY_CARE_PROVIDER_SITE_OTHER): Payer: Medicare Other | Admitting: Obstetrics & Gynecology

## 2015-04-17 ENCOUNTER — Encounter: Payer: Self-pay | Admitting: Obstetrics & Gynecology

## 2015-04-17 VITALS — BP 118/82 | HR 60 | Resp 16 | Ht 64.5 in | Wt 195.0 lb

## 2015-04-17 DIAGNOSIS — Z01419 Encounter for gynecological examination (general) (routine) without abnormal findings: Secondary | ICD-10-CM | POA: Diagnosis not present

## 2015-04-17 DIAGNOSIS — Z124 Encounter for screening for malignant neoplasm of cervix: Secondary | ICD-10-CM

## 2015-04-17 DIAGNOSIS — B029 Zoster without complications: Secondary | ICD-10-CM | POA: Diagnosis not present

## 2015-04-17 MED ORDER — VITAMIN D (ERGOCALCIFEROL) 1.25 MG (50000 UNIT) PO CAPS: ORAL_CAPSULE | ORAL | Status: DC

## 2015-04-17 NOTE — Progress Notes (Signed)
74 y.o. G1P1001 MarriedCaucasianF here for annual exam.  She is doing really well.  Granddaughter is 14 months.    Pt saw Dr. Jana Hakim in April.  Will plan to stop Arimidex in 2018.    Pt reports having a possible small outbreak of shingles.  Took Valtrex for a week.  Was also given Zpak and steroid taper.    PCP:  Dr. Osborne Casco.  She brought labs from 4/16 for me to see.    Patient's last menstrual period was 08/02/1995.          Sexually active: ?  The current method of family planning is post menopausal status.    Exercising: No.  The patient does not participate in regular exercise at present. Smoker:  no  Health Maintenance: Pap:  03/13/14 Neg History of abnormal Pap:  no MMG:  08/20/14 BIRADS1:neg Colonoscopy:  03/2012 Polyps- repeat 7 - 10 years  BMD:   06/21/15, -1.9 TDaP:  2015  Screening Labs: PCP, Hb today: PCP, Urine today: PCP   reports that she has never smoked. She has never used smokeless tobacco. She reports that she does not drink alcohol or use illicit drugs.  Past Medical History  Diagnosis Date  . Endometrial hyperplasia, simple   . Endometrial polyp   . Hypothyroidism   . Basal cell carcinoma of neck        . Breast cancer 08/31/11    DCIS; right breast, ER/PR +  . Hx of radiation therapy 10/18/11 to 11/18/11    right breast  . Hx of radiation therapy 1957    4 tx to face for acne  . Metacarpal bone fracture 08/30/2012    Right 5th finger fracture  . H/O TB skin testing 1970's    + INH rx  . Breast cancer 2/13    9 MM R breast- E+/P+ HVR 2-  . Fracture     right arm    Past Surgical History  Procedure Laterality Date  . Endometrial polyp    . Tonsillectomy      adenoids  . Mastectomy, partial  09/14/11    right breast  . Hysteroscopy  12/00    polyps  . Breast lumpectomy  2/13    sentinel node    Current Outpatient Prescriptions  Medication Sig Dispense Refill  . anastrozole (ARIMIDEX) 1 MG tablet TAKE 1 TABLET BY MOUTH DAILY 90 tablet 0  .  Ascorbic Acid (VITAMIN C) 1000 MG tablet Take 1,000 mg by mouth daily.    . calcium-vitamin D (SM CALCIUM 500/VITAMIN D3) 500-400 MG-UNIT per tablet Take 630 mg by mouth.    . levothyroxine (SYNTHROID) 112 MCG tablet Take 112 mcg by mouth daily before breakfast. Sunday and Wednesday 1/2 tab, 1 tablet all other days.    . magnesium oxide (MAG-OX) 400 (241.3 MG) MG tablet Take 1 tablet (400 mg total) by mouth daily.    . RESTASIS 0.05 % ophthalmic emulsion INT 1 GTT IN OU BID  0  . Vitamin D, Ergocalciferol, (DRISDOL) 50000 UNITS CAPS capsule TAKE 1 CAPSULE BY MOUTH EVERY 7 DAYS 12 capsule 4   No current facility-administered medications for this visit.    Family History  Problem Relation Age of Onset  . Skin cancer Father 42  . Breast cancer Paternal Aunt     x 2  . Stomach cancer Paternal Grandmother 33  . Breast cancer Cousin     ages 76-60 at dx x 4 paternal  . Prostate cancer Cousin     ????  .  Brain cancer Maternal Uncle     brain  . Cancer Cousin     tonsillar  . Colon cancer Neg Hx   . Stroke Father   . Stroke Brother     ROS:  Pertinent items are noted in HPI.  Otherwise, a comprehensive ROS was negative.  Exam:   BP 118/82 mmHg  Pulse 60  Resp 16  Ht 5' 4.5" (1.638 m)  Wt 195 lb (88.451 kg)  BMI 32.97 kg/m2  LMP 08/02/1995  Weight change: -9#  Height: 5' 4.5" (163.8 cm)  Ht Readings from Last 3 Encounters:  04/17/15 5' 4.5" (1.638 m)  11/06/14 5' 4.25" (1.632 m)  03/13/14 5' 4.25" (1.632 m)   General appearance: alert, cooperative and appears stated age Head: Normocephalic, without obvious abnormality, atraumatic Neck: no adenopathy, supple, symmetrical, trachea midline and thyroid normal to inspection and palpation Lungs: clear to auscultation bilaterally Breasts: normal appearance, no masses or tenderness, on right side, left breast with well healed right lumpectomy and right lymph node sampling scar, no LAD Heart: regular rate and rhythm Abdomen: soft,  non-tender; bowel sounds normal; no masses,  no organomegaly Extremities: extremities normal, atraumatic, no cyanosis or edema Skin: Skin color, texture, turgor normal. No rashes or lesions Lymph nodes: Cervical, supraclavicular, and axillary nodes normal. No abnormal inguinal nodes palpated Neurologic: Grossly normal   Pelvic: External genitalia:  no lesions              Urethra:  normal appearing urethra with no masses, tenderness or lesions              Bartholins and Skenes: normal                 Vagina: normal appearing vagina with normal color and discharge, no lesions              Cervix: no lesions              Pap taken: No. Bimanual Exam:  Uterus:  normal size, contour, position, consistency, mobility, non-tender              Adnexa: normal adnexa and no mass, fullness, tenderness               Rectovaginal: Confirms               Anus:  normal sphincter tone, no lesions  Chaperone was present for exam.  A:  Well Woman with normal exam  H/o 1a breast cancer, Er/PR +, s/p lumpectomy and radiation 2013, on Arimidex for five full years Remote hx of endometrial hyperplasia 2000 Osteopenia, wrist fx 2/14 from fall  Vit D deficiency Hypothyroidism   P: Mammogram yearly. Does diagnostic mmg yearly.  Pap 2015.  No pap smear obtained today. BMD will be repeat in 2018. Rx for Vit D 50,000 IU weekly.  Rx to pharmacy.  Vit D was 42 on 11/18/14 done with Dr. Loren Racer lab work. return annually or prn

## 2015-05-04 ENCOUNTER — Encounter: Payer: Self-pay | Admitting: Nurse Practitioner

## 2015-05-04 ENCOUNTER — Ambulatory Visit (HOSPITAL_BASED_OUTPATIENT_CLINIC_OR_DEPARTMENT_OTHER): Payer: Medicare Other | Admitting: Nurse Practitioner

## 2015-05-04 ENCOUNTER — Telehealth: Payer: Self-pay | Admitting: Nurse Practitioner

## 2015-05-04 ENCOUNTER — Telehealth: Payer: Self-pay | Admitting: Oncology

## 2015-05-04 VITALS — BP 149/59 | HR 78 | Temp 97.6°F | Resp 20 | Ht 64.5 in | Wt 193.0 lb

## 2015-05-04 DIAGNOSIS — Z170 Estrogen receptor positive status [ER+]: Secondary | ICD-10-CM

## 2015-05-04 DIAGNOSIS — C50411 Malignant neoplasm of upper-outer quadrant of right female breast: Secondary | ICD-10-CM

## 2015-05-04 DIAGNOSIS — M8580 Other specified disorders of bone density and structure, unspecified site: Secondary | ICD-10-CM

## 2015-05-04 DIAGNOSIS — Z79811 Long term (current) use of aromatase inhibitors: Secondary | ICD-10-CM

## 2015-05-04 NOTE — Patient Instructions (Addendum)
Thank you for coming in today!  As we discussed, continue your anastrozole at this time.  I will put in orders for your mammogram in January 2017 and will call you once we have those results. You will see Dr. Jana Hakim in April 2017.  Regarding your osteopenia, continue to take your vitamin D and calcium and resume your weight bearing exercise (especially walking).  Discuss the possibility of fosamax or another bisphosphonate with Dr. Osborne Casco and keep [place] posted.  I've included information below on osteoporosis - which you do not have (your bone density results revealed osteopenia) - but it provides some additional information.  Also, please let me know ASAP if the redness in your right breast returns.  I've included some information about cellulitis (which is what I think the redness was caused by and relieved by your antibiotics).  Just let me know if it returns / doesn't completely go away now that you've finished antibiotics.  Symptoms to Watch for and Report to Your Provider  . Return of the cancer symptoms you had before- such as a lump or new growth where your cancer first started . New or unusual pain that seems unrelated to an injury and does not go away, including back pain or bone pain . Weight loss without trying/intending . Unexplained bleeding . A rash or allergic reaction, such as swelling, severe itching or wheezing . Chills or fevers . Persistent headaches . Shortness of breath or difficulty breathing . Bloody stools or blood in your urine . Lumps, bumps, swelling and/or nipple discharge . Nausea, vomiting, diarrhea, loss of appetite, or trouble swallowing . A cough that doesn't go away . Abdominal pain . Swelling in your arms or legs . Fractures . Hot flashes or other menopausal symptoms . Any other signs mentioned by your doctor or nurse or any unusual symptoms                 that you just can't explain   NOTE: Just because you have certain symptoms, it doesn't mean the cancer  has come back or you have a new cancer. Symptoms can be due to other problems that need to be addressed.  It is important to watch for these symptoms and report them to your provider so you can be medically evaluated for any of these concerns!    Living a Life of Wellness After Cancer:  *Note: Please consult your health care provider before using any medications, supplements, over-the-counter products, or other interventions.  Also, please consult your primary care provider before you begin any lifestyle program (diet, exercise, etc.).  Your safety is our top priority and we want to make sure you continue to live a long and healthy life!    Healthy Lifestyle Recommendations  As a cancer survivor, it is important develop a lifelong commitment to a healthy lifestyle. A healthy lifestyle can prevent cancer from returning as well as prevent other diseases like heart disease, diabetes and high blood pressure.  These are some things that you can do to have a healthy lifestyle:  Marland Kitchen Maintain a healthy weight.  . Exercise daily per your doctor's orders. . Eat a balanced diet high in fruits, vegetables, bran, and fiber. . Limit how much alcohol you consume, if at all. Ali Lowe regular bone mineral density testing for osteoporosis.  . Talk to your doctor about cardiovascular disease or "heart disease" screening. . Stop smoking (if you smoke). . Know your family history. . Be mindful of your emotional, social,  and spiritual needs. . Meet regularly with a Primary Care Provider (PCP). Find a PCP if you do not already have one. . Talk to your doctor about regular cancer screening.    Osteoporosis Throughout your life, your body breaks down old bone and replaces it with new bone. As you get older, your body does not replace bone as quickly as it breaks it down. By the age of 30 years, most people begin to gradually lose bone because of the imbalance between bone loss and replacement. Some people lose more bone  than others. Bone loss beyond a specified normal degree is considered osteoporosis.  Osteoporosis affects the strength and durability of your bones. The inside of the ends of your bones and your flat bones, like the bones of your pelvis, look like honeycomb, filled with tiny open spaces. As bone loss occurs, your bones become less dense. This means that the open spaces inside your bones become bigger and the walls between these spaces become thinner. This makes your bones weaker. Bones of a person with osteoporosis can become so weak that they can break (fracture) during minor accidents, such as a simple fall. CAUSES  The following factors have been associated with the development of osteoporosis:  Smoking.  Drinking more than 2 alcoholic drinks several days per week.  Long-term use of certain medicines:  Corticosteroids.  Chemotherapy medicines.  Thyroid medicines.  Antiepileptic medicines.  Gonadal hormone suppression medicine.  Immunosuppression medicine.  Being underweight.  Lack of physical activity.  Lack of exposure to the sun. This can lead to vitamin D deficiency.  Certain medical conditions:  Certain inflammatory bowel diseases, such as Crohn disease and ulcerative colitis.  Diabetes.  Hyperthyroidism.  Hyperparathyroidism. RISK FACTORS Anyone can develop osteoporosis. However, the following factors can increase your risk of developing osteoporosis:  Gender--Women are at higher risk than men.  Age--Being older than 50 years increases your risk.  Ethnicity--White and Asian people have an increased risk.  Weight --Being extremely underweight can increase your risk of osteoporosis.  Family history of osteoporosis--Having a family member who has developed osteoporosis can increase your risk. SYMPTOMS  Usually, people with osteoporosis have no symptoms.  DIAGNOSIS  Signs during a physical exam that may prompt your caregiver to suspect osteoporosis  include:  Decreased height. This is usually caused by the compression of the bones that form your spine (vertebrae) because they have weakened and become fractured.  A curving or rounding of the upper back (kyphosis). To confirm signs of osteoporosis, your caregiver may request a procedure that uses 2 low-dose X-ray beams with different levels of energy to measure your bone mineral density (dual-energy X-ray absorptiometry [DXA]). Also, your caregiver may check your level of vitamin D. TREATMENT  The goal of osteoporosis treatment is to strengthen bones in order to decrease the risk of bone fractures. There are different types of medicines available to help achieve this goal. Some of these medicines work by slowing the processes of bone loss. Some medicines work by increasing bone density. Treatment also involves making sure that your levels of calcium and vitamin D are adequate. PREVENTION  There are things you can do to help prevent osteoporosis. Adequate intake of calcium and vitamin D can help you achieve optimal bone mineral density. Regular exercise can also help, especially resistance and weight-bearing activities. If you smoke, quitting smoking is an important part of osteoporosis prevention. MAKE SURE YOU:  Understand these instructions.  Will watch your condition.  Will get help right  away if you are not doing well or get worse. FOR MORE INFORMATION www.osteo.org and [employer].ee Document Released: 04/27/2005 Document Revised: 11/12/2012 Document Reviewed: 07/02/2011 [facility] Patient Information 2015 [place], [place]. This information is not intended to replace advice given to you by your health care provider. Make sure you discuss any questions you have with your health care provider.  .Cellulitis Cellulitis is an infection of the skin and the tissue beneath it. The infected area is usually red and tender. Cellulitis occurs most often in the arms and lower legs.  CAUSES  Cellulitis  is caused by bacteria that enter the skin through cracks or cuts in the skin. The most common types of bacteria that cause cellulitis are staphylococci and streptococci. SIGNS AND SYMPTOMS   Redness and warmth.  Swelling.  Tenderness or pain.  Fever. DIAGNOSIS  Your health care provider can usually determine what is wrong based on a physical exam. Blood tests may also be done. TREATMENT  Treatment usually involves taking an antibiotic medicine. HOME CARE INSTRUCTIONS   Take your antibiotic medicine as directed by your health care provider. Finish the antibiotic even if you start to feel better.  Keep the infected arm or leg elevated to reduce swelling.  Apply a warm cloth to the affected area up to 4 times per day to relieve pain.  Take medicines only as directed by your health care provider.  Keep all follow-up visits as directed by your health care provider. SEEK MEDICAL CARE IF:   You notice red streaks coming from the infected area.  Your red area gets larger or turns dark in color.  Your bone or joint underneath the infected area becomes painful after the skin has healed.  Your infection returns in the same area or another area.  You notice a swollen bump in the infected area.  You develop new symptoms.  You have a fever. SEEK IMMEDIATE MEDICAL CARE IF:   You feel very sleepy.  You develop vomiting or diarrhea.  You have a general ill feeling (malaise) with muscle aches and pains. MAKE SURE YOU:   Understand these instructions.  Will watch your condition.  Will get help right away if you are not doing well or get worse. Document Released: 04/27/2005 Document Revised: 12/02/2013 Document Reviewed: 10/03/2011 [facility] Patient Information 2015 [place], [place]. This information is not intended to replace advice given to you by your health care provider. Make sure you discuss any questions you have with your health care provider.

## 2015-05-04 NOTE — Progress Notes (Signed)
CLINIC:  Cancer Survivorship   REASON FOR VISIT:  Routine follow-up post-treatment for history of breast cancer.  BRIEF ONCOLOGIC HISTORY:    Breast cancer of upper-outer quadrant of right female breast ([place])   08/15/2011 Mammogram Right breast: possible mass is noted; further imaging needed.   08/31/2011 Breast US Right breast: suspicious 5 x 6 x 6 mm mass in the UOQ with calcifications   08/31/2011 Initial Biopsy Right breast needle core bx: Invasive ductal carcinoma with associated calcs, ER+ (100%), PR+ (100%), HER2/neu negative (ratio 1.56), Ki-67 19%, DCIS   09/06/2011 Breast MRI Right breast: mass measuring 9 x 9 x 8 mm. No other lesions were seen. There is no evidence of adenopathy. 1 cm lesion in the left lobe of the liver, likely cyst.   09/06/2011 Clinical Stage Stage IA: T1 N0   09/09/2011 Procedure Genetic testing ([facility]): BRCA1/2: negative, no mutation detected.   09/14/2011 Definitive Surgery Right lumpectomy / [facility] Batman): invasive ductal carcinoma, grade 1, HER-2/neu repeated and remains negative (ratio 1.32), high grade DCIS with comedonecrosis and calcificiation, 4 LN negative for malignancy (0/4)   09/14/2011 Pathologic Stage Stage IA: pT1b, pN0   10/18/2011 - 11/18/2014 Radiation Therapy Adjuvant RT completed Valere Dross): Right breast 48 Gy over 24 fractions.    10/2011 -  Anti-estrogen oral therapy Anastrozole 1 mg daily.  Planned duration of therapy 5 years.    INTERVAL HISTORY:  Ms. Sanko presents to [facility] today for ongoing follow up regarding her breast cancer. Ms. Tencza reports doing well overall aside from a recent skin rash across her right chest / upper abdomen. She states that she thinks she had shingles and was seen in an Urgent Care in [place] where she was given antiviral medication and a zpack. She reports that she noticed a rash that was raised but denies the presence of any blisters.  She states that the area itched and was  warm, but did not burn or hurt.  Since that time, most of the redness has improved and she denies any additional areas of concern.  She denies any change, mass, or nodule within either breast.  She reports a good appetite and denies any weight loss or fatigue.  She has had no headache, bone pain, shortness of breath, or cough. She continues on her anastrozole without side effects. When asked, she states she has not made a decision on bisphosphonate therapy but is leaning towards not starting therapy as she only has 1 1/2 years of anastrozole left.  She recently attended the [employer], "[employer]," this past weekend in [place], where she heard that some women are continuing on AI therapy for 10 years vs. 5. Her last mammogram was in January 2016 and unremarkable.  She is scheduled to see Dr. Jana Hakim in April 2017.   REVIEW OF SYSTEMS:  General: Denies fever, chills, or night sweats. HEENT: She has cataracts and is deciding on what steps to take regarding management.  She denies mouth sores, hearing loss, visual changes, or difficulty swallowing. Cardiac: Denies palpitations, chest pain, and lower extremity edema.  Respiratory: Denies dyspnea on exertion.  GI: Denies abdominal pain, constipation, diarrhea, nausea, or vomiting.  GU: Denies dysuria, hematuria, vaginal bleeding, vaginal discharge, or vaginal dryness.  Musculoskeletal: Denies joint or bone pain.  Neuro: Denies headache or recent falls. Denies peripheral neuropathy. Skin: Recent skin rash as above, otherwise, denies skin changes, pruritis or open wounds.  Breast: Denies any new nodularity, masses, tenderness, nipple changes,  or nipple discharge.  Psych: Denies depression, anxiety, insomnia, or memory loss.   A 14-point review of systems was completed and was negative, except as noted above    PAST MEDICAL/SURGICAL HISTORY:  Past Medical History  Diagnosis Date  . Endometrial hyperplasia, simple   . Endometrial  polyp   . Hypothyroidism   . Basal cell carcinoma of neck        . Breast cancer ([place]) 08/31/11    DCIS; right breast, ER/PR +  . Hx of radiation therapy 10/18/11 to 11/18/11    right breast  . Hx of radiation therapy 1957    4 tx to face for acne  . Metacarpal bone fracture 08/30/2012    Right 5th finger fracture  . H/O TB skin testing 1970's    + INH rx  . Breast cancer ([place]) 2/13    9 MM R breast- E+/P+ HVR 2-  . Fracture     right arm   Past Surgical History  Procedure Laterality Date  . Endometrial polyp    . Tonsillectomy      adenoids  . Mastectomy, partial  09/14/11    right breast  . Hysteroscopy  12/00    polyps  . Breast lumpectomy  2/13    sentinel node     ALLERGIES:  Allergies  Allergen Reactions  . Other     PLEASE DO BLOOD PRESSURE IN LEFT ARM ONLY     CURRENT MEDICATIONS:  Current Outpatient Prescriptions on File Prior to Visit  Medication Sig Dispense Refill  . anastrozole (ARIMIDEX) 1 MG tablet TAKE 1 TABLET BY MOUTH DAILY 90 tablet 0  . Ascorbic Acid (VITAMIN C) 1000 MG tablet Take 1,000 mg by mouth daily.    . calcium-vitamin D (SM CALCIUM 500/VITAMIN D3) 500-400 MG-UNIT per tablet Take 630 mg by mouth.    . levothyroxine (SYNTHROID) 112 MCG tablet Take 112 mcg by mouth daily before breakfast. Sunday and Wednesday 1/2 tab, 1 tablet all other days.    . magnesium oxide (MAG-OX) 400 (241.3 MG) MG tablet Take 1 tablet (400 mg total) by mouth daily.    . RESTASIS 0.05 % ophthalmic emulsion INT 1 GTT IN OU BID  0  . Vitamin D, Ergocalciferol, (DRISDOL) 50000 UNITS CAPS capsule TAKE 1 CAPSULE BY MOUTH EVERY 7 DAYS 12 capsule 4   No current facility-administered medications on file prior to visit.     ONCOLOGIC FAMILY HISTORY:  Family History  Problem Relation Age of Onset  . Skin cancer Father 83  . Breast cancer Paternal Aunt     x 2  . Stomach cancer Paternal Grandmother 33  . Breast cancer Cousin     ages 16-60 at dx x 4 paternal  .  Prostate cancer Cousin     ????  . Brain cancer Maternal Uncle     brain  . Cancer Cousin     tonsillar  . Colon cancer Neg Hx   . Stroke Father   . Stroke Brother      GENETIC COUNSELING/TESTING: Performed 09/09/2011 and negative for BRCA1 and BRCA2.  SOCIAL HISTORY:  Jamie Cole is married and lives with her spouse in [place], [place].  Her husband has recently been experiencing a series of health problems and is on the mend.  She has a son who lives in [place] and she has a 104 month old granddaughter who is adopted. Her name is Belenda Cruise.  Ms. 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  She denies any current or history of tobacco, alcohol, or illicit drug use.    HEALTH MAINTENANCE: She is up to date on Pap (03/13/2014) and colonoscopy (10/11/2011).  Her most recent DEXA scan performed August 20, 2014 shows osteopenia.  PHYSICAL EXAMINATION:  Vital Signs:   Filed Vitals:   05/04/15 1155  BP: 149/59  Pulse: 78  Temp: 97.6 F (36.4 C)  Resp: 20   ECOG performance status: 1 General: Well-nourished, well-appearing female in no acute distress.  She is unaccompanied in clinic today.   HEENT: Head is atraumatic and normocephalic.  Pupils equal and reactive to light and accomodation. Conjunctivae clear without exudate.  Sclerae anicteric. Oral mucosa is pink, moist, and intact without lesions.  Oropharynx is pink without lesions or erythema.  Lymph: No cervical, supraclavicular, infraclavicular, or axillary lymphadenopathy noted on palpation.  Cardiovascular: Regular rate and rhythm without murmurs, rubs, or gallops. Respiratory: Clear to auscultation bilaterally. Chest expansion symmetric without accessory muscle use on inspiration or expiration.  Breast: Bilateral breast exam performed. Skin slightly pink over right breast.  No erythema.  No peau d'orange, scaling, lesion, or thickening. No  mass or nodularity at either breast.  No axillary lymphadenopathy bilaterally.  GI: Abdomen soft and round. No tenderness to palpation. Bowel sounds normoactive in 4 quadrants. No hepatosplenomegaly.   GU: Deferred.  Musculoskeletal: Muscle strength 5/5 in all extremities.    Neuro: No focal deficits. Steady gait.  Psych: Mood and affect normal and appropriate for situation.  Extremities: No edema, cyanosis, or clubbing.  Skin: Skin is pink over right upper abdomen at site of healing infection.  No erythema, lesions or rash at present.  LABORATORY DATA:  No results found for this or any previous visit (from the past 2160 hour(s)).  DIAGNOSTIC IMAGING: Last mammogram from August 20, 2014 and shows no evidence of malignancy.     ASSESSMENT AND PLAN:   1. History of breast cancer: Stage IA invasive ductal carcinoma, grade1, ER positive, PR positive, HER2/neu negative, S/P lumpectomy and adjuvant radiation therapy, followed by adjuvant endocrine therapy with anastrozole begun April 2013 with plans to continue at least 5 years.  Ms. Rossetti is doing well with no symptoms worrisome for cancer recurrence at this time. I have reviewed the recommendations for ongoing surveillance with her and she will follow-up with her medical oncologist,  Dr. Jana Hakim, in April 2017 with history and physical exam per surveillance protocol.  I will order her mammogram for January 2017.  She will continue her anti-estrogen therapy with  Anastrozole as prescribed by  Dr. Jana Hakim and is due to complete 5 years of therapy in April 2018.  We discussed the recent studies indicating benefit in some women with aromatase inhibitor therapy beyond five years and I reinforced that we will evaluate her and discuss this further with her as she approaches the completion of 5 years of therapy.  Regarding her recent skin changes, I do not feel that this was consistent with shingles, but rather cellulitis of the breast / upper abdomen  that appears to be responding to antibiotics.  She shared with me a photo that she had taken of the area approximately two weeks ago when it developed and it appears to be more consistent with cellulitis.  I have asked her to notify me if it does not completely resolve or if any new areas appear.  She was instructed to make Dr. Jana Hakim or myself  aware if she notes any change within her breast, any new symptoms such as pain, shortness of breath, weight loss, or fatigue.  She will also report any side effects of the endocrine therapy or any difficulties with it.     2. Bone health:  Given Ms. Standish's age/history of breast cancer and her current treatment regimen including anti-estrogen therapy with anastrozole, she is at risk for bone demineralization and her most recent DEXA scan revelas osteopenia.  She is still uncertain whether she wants to begin bisphosphonate therapy and we have discussed the various agents and their side effects.  She will continue to consider this and discuss further with Dr. Osborne Casco.  In the meantime, she was encouraged to increase her consumption of foods rich in calcium and vitamin D as well as to increase her weight-bearing activities.  She was given education on specific activities to promote bone health.  3. Cancer screening:  Due to Ms. Luppino's history and her age, she should receive screening for skin cancers, colon cancer, and gynecologic cancers.  The information and recommendations are listed on the patient's comprehensive care plan/treatment summary and were reviewed in detail with the patient.    4. Health maintenance and wellness promotion: Ms. Street was encouraged to consume 5-7 servings of fruits and vegetables per day. She was also encouraged to engage in moderate to vigorous exercise for 30 minutes per day most days of the week. She was instructed to limit her alcohol consumption and continue to abstain from tobacco use.     A total of 30 minutes of  face-to-face time was spent with this patient with greater than 50% of that time in counseling and care-coordination.   Sylvan Cheese, NP  [facility] [place] [phone]   Note: PRIMARY CARE PROVIDER [name], [place] [phone]

## 2015-05-04 NOTE — Telephone Encounter (Signed)
Gave pt AVS and Calendar.... Jamie Cole

## 2015-05-04 NOTE — Telephone Encounter (Signed)
Called and left a message with mammo

## 2015-05-04 NOTE — Telephone Encounter (Signed)
lvm for pt regarding to OCT 2017 appt....pt ok and aware

## 2015-05-05 ENCOUNTER — Telehealth: Payer: Self-pay | Admitting: Nurse Practitioner

## 2015-05-05 ENCOUNTER — Other Ambulatory Visit: Payer: Self-pay | Admitting: Nurse Practitioner

## 2015-05-05 DIAGNOSIS — L03313 Cellulitis of chest wall: Secondary | ICD-10-CM

## 2015-05-05 MED ORDER — CEPHALEXIN 500 MG PO CAPS: 500.0000 mg | ORAL_CAPSULE | Freq: Two times a day (BID) | ORAL | Status: DC

## 2015-05-05 NOTE — Telephone Encounter (Signed)
Received message from patient asking for return call to discuss skin redness over right chest.  During office visit 05/04/15, discussed possibility to second course of antibiotics to clear remaining skin infection. Pt agreeable to this.  Will send in prescription for Keflex.  Called and discussed with patient, who will notify us if symptoms do not improve or worsen.

## 2015-05-12 ENCOUNTER — Telehealth: Payer: Self-pay | Admitting: Nurse Practitioner

## 2015-05-12 NOTE — Telephone Encounter (Signed)
Received phone message from patient regarding completion of antibiotics.  Attempted to call patient to discuss symptoms and no answer.  Left message for return call.

## 2015-07-02 DIAGNOSIS — L570 Actinic keratosis: Secondary | ICD-10-CM | POA: Diagnosis not present

## 2015-07-02 DIAGNOSIS — L719 Rosacea, unspecified: Secondary | ICD-10-CM | POA: Diagnosis not present

## 2015-07-02 DIAGNOSIS — L821 Other seborrheic keratosis: Secondary | ICD-10-CM | POA: Diagnosis not present

## 2015-07-02 DIAGNOSIS — Z85828 Personal history of other malignant neoplasm of skin: Secondary | ICD-10-CM | POA: Diagnosis not present

## 2015-07-09 ENCOUNTER — Other Ambulatory Visit: Payer: Self-pay | Admitting: Oncology

## 2015-07-25 DIAGNOSIS — Z6831 Body mass index (BMI) 31.0-31.9, adult: Secondary | ICD-10-CM | POA: Diagnosis not present

## 2015-07-25 DIAGNOSIS — J069 Acute upper respiratory infection, unspecified: Secondary | ICD-10-CM | POA: Diagnosis not present

## 2015-07-25 DIAGNOSIS — J029 Acute pharyngitis, unspecified: Secondary | ICD-10-CM | POA: Diagnosis not present

## 2015-08-11 DIAGNOSIS — Z6831 Body mass index (BMI) 31.0-31.9, adult: Secondary | ICD-10-CM | POA: Diagnosis not present

## 2015-08-11 DIAGNOSIS — C50411 Malignant neoplasm of upper-outer quadrant of right female breast: Secondary | ICD-10-CM | POA: Diagnosis not present

## 2015-08-24 ENCOUNTER — Ambulatory Visit
Admission: RE | Admit: 2015-08-24 | Discharge: 2015-08-24 | Disposition: A | Discharge status: Home / Self Care | Payer: Medicare Other | Source: Ambulatory Visit | Attending: Nurse Practitioner | Admitting: Nurse Practitioner

## 2015-08-24 ENCOUNTER — Other Ambulatory Visit: Payer: Self-pay | Admitting: Nurse Practitioner

## 2015-08-24 DIAGNOSIS — C50411 Malignant neoplasm of upper-outer quadrant of right female breast: Secondary | ICD-10-CM

## 2015-08-24 DIAGNOSIS — R928 Other abnormal and inconclusive findings on diagnostic imaging of breast: Secondary | ICD-10-CM | POA: Diagnosis not present

## 2015-08-24 DIAGNOSIS — N6489 Other specified disorders of breast: Secondary | ICD-10-CM | POA: Diagnosis not present

## 2015-08-26 ENCOUNTER — Telehealth: Payer: Self-pay | Admitting: Oncology

## 2015-08-26 NOTE — Telephone Encounter (Signed)
Spoke to patient in regards to [facility] appointment per 1/23 pof. Appointment set for July 7 @9am  at the breast center.

## 2015-09-14 ENCOUNTER — Emergency Department (HOSPITAL_COMMUNITY)
Admission: EM | Admit: 2015-09-14 | Discharge: 2015-09-14 | Disposition: A | Discharge status: Home / Self Care | Payer: Medicare Other | Attending: Emergency Medicine | Admitting: Emergency Medicine

## 2015-09-14 ENCOUNTER — Encounter (HOSPITAL_COMMUNITY): Payer: Self-pay | Admitting: Emergency Medicine

## 2015-09-14 ENCOUNTER — Emergency Department (HOSPITAL_COMMUNITY): Payer: Medicare Other

## 2015-09-14 DIAGNOSIS — Z853 Personal history of malignant neoplasm of breast: Secondary | ICD-10-CM | POA: Diagnosis not present

## 2015-09-14 DIAGNOSIS — Y9301 Activity, walking, marching and hiking: Secondary | ICD-10-CM | POA: Diagnosis not present

## 2015-09-14 DIAGNOSIS — Z85828 Personal history of other malignant neoplasm of skin: Secondary | ICD-10-CM | POA: Insufficient documentation

## 2015-09-14 DIAGNOSIS — E039 Hypothyroidism, unspecified: Secondary | ICD-10-CM | POA: Insufficient documentation

## 2015-09-14 DIAGNOSIS — W01198A Fall on same level from slipping, tripping and stumbling with subsequent striking against other object, initial encounter: Secondary | ICD-10-CM | POA: Diagnosis not present

## 2015-09-14 DIAGNOSIS — S098XXA Other specified injuries of head, initial encounter: Secondary | ICD-10-CM | POA: Diagnosis not present

## 2015-09-14 DIAGNOSIS — Z8742 Personal history of other diseases of the female genital tract: Secondary | ICD-10-CM | POA: Diagnosis not present

## 2015-09-14 DIAGNOSIS — S0990XA Unspecified injury of head, initial encounter: Secondary | ICD-10-CM | POA: Diagnosis not present

## 2015-09-14 DIAGNOSIS — Z792 Long term (current) use of antibiotics: Secondary | ICD-10-CM | POA: Diagnosis not present

## 2015-09-14 DIAGNOSIS — Z8781 Personal history of (healed) traumatic fracture: Secondary | ICD-10-CM | POA: Diagnosis not present

## 2015-09-14 DIAGNOSIS — Z79899 Other long term (current) drug therapy: Secondary | ICD-10-CM | POA: Insufficient documentation

## 2015-09-14 DIAGNOSIS — W19XXXA Unspecified fall, initial encounter: Secondary | ICD-10-CM

## 2015-09-14 DIAGNOSIS — IMO0002 Reserved for concepts with insufficient information to code with codable children: Secondary | ICD-10-CM

## 2015-09-14 DIAGNOSIS — S0101XA Laceration without foreign body of scalp, initial encounter: Secondary | ICD-10-CM | POA: Diagnosis not present

## 2015-09-14 DIAGNOSIS — Y998 Other external cause status: Secondary | ICD-10-CM | POA: Insufficient documentation

## 2015-09-14 DIAGNOSIS — Y9289 Other specified places as the place of occurrence of the external cause: Secondary | ICD-10-CM | POA: Insufficient documentation

## 2015-09-14 MED ORDER — ACETAMINOPHEN 325 MG PO TABS
650.0000 mg | ORAL_TABLET | Freq: Once | ORAL | Status: AC
  Administered 2015-09-14: 650 mg via ORAL
  Filled 2015-09-14: qty 2

## 2015-09-14 NOTE — ED Notes (Signed)
Pt transported to CT ?

## 2015-09-14 NOTE — ED Provider Notes (Signed)
CSN: [id number]     Arrival date & time 09/14/15  2027 History   First MD Initiated Contact with Patient 09/14/15 2038     Chief Complaint  Patient presents with  . Fall     (Consider location/radiation/quality/duration/timing/severity/associated sxs/prior Treatment) HPI   Patient is a 75 year old female with past medical history of hypothyroidism and breast cancer who presents to the ED via EMS status post fall, onset prior to arrival. Patient reports she was walking out of the building when her jacket got stuck on his door resulting her losing balance and falling backwards. She notes she hit the back of her head on the brick wall and then slid down the wall landing on her side. Denies LOC. Endorses mild aching pain to the left side of her head. She reports having a cut on that side of her head. Denies use of anticoagulants. Denies fever, chills, visual changes, lightheadedness, dizziness, neck pain, back pain, SOB, CP, abdominal pain, N/V, numbness, tingling, weakness. Tetanus UTD.  Past Medical History  Diagnosis Date  . Endometrial hyperplasia, simple   . Endometrial polyp   . Hypothyroidism   . Basal cell carcinoma of neck        . Breast cancer ([place]) 08/31/11    DCIS; right breast, ER/PR +  . Hx of radiation therapy 10/18/11 to 11/18/11    right breast  . Hx of radiation therapy 1957    4 tx to face for acne  . Metacarpal bone fracture 08/30/2012    Right 5th finger fracture  . H/O TB skin testing 1970's    + INH rx  . Breast cancer ([place]) 2/13    9 MM R breast- E+/P+ HVR 2-  . Fracture     right arm   Past Surgical History  Procedure Laterality Date  . Endometrial polyp    . Tonsillectomy      adenoids  . Mastectomy, partial  09/14/11    right breast  . Hysteroscopy  12/00    polyps  . Breast lumpectomy  2/13    sentinel node   Family History  Problem Relation Age of Onset  . Skin cancer Father 74  . Breast cancer Paternal Aunt     x 2  . Stomach cancer Paternal  Grandmother 2  . Breast cancer Cousin     ages 56-60 at dx x 4 paternal  . Prostate cancer Cousin     ????  . Brain cancer Maternal Uncle     brain  . Cancer Cousin     tonsillar  . Colon cancer Neg Hx   . Stroke Father   . Stroke Brother    Social History  Substance Use Topics  . Smoking status: Never Smoker   . Smokeless tobacco: Never Used  . Alcohol Use: No   OB History    Gravida Para Term Preterm AB TAB SAB Ectopic Multiple Living   1 1 1       1       Obstetric Comments   Menarche age 55, menopause 34, HRT x 2-3 yrs     Review of Systems  Neurological: Positive for headaches.  All other systems reviewed and are negative.     Allergies  Other  Home Medications   Prior to Admission medications   Medication Sig Start Date End Date Taking? Authorizing Provider  anastrozole (ARIMIDEX) 1 MG tablet TAKE 1 TABLET BY MOUTH DAILY 07/10/15   Chauncey Cruel, MD  Ascorbic Acid (VITAMIN C)  1000 MG tablet Take 1,000 mg by mouth daily.    Historical Provider, MD  calcium-vitamin D (SM CALCIUM 500/VITAMIN D3) 500-400 MG-UNIT per tablet Take 630 mg by mouth.    Historical Provider, MD  cephALEXin (KEFLEX) 500 MG capsule Take 1 capsule (500 mg total) by mouth 2 (two) times daily. 05/05/15   Sylvan Cheese, NP  levothyroxine (SYNTHROID) 112 MCG tablet Take 112 mcg by mouth daily before breakfast. Sunday and Wednesday 1/2 tab, 1 tablet all other days.    Historical Provider, MD  magnesium oxide (MAG-OX) 400 (241.3 MG) MG tablet Take 1 tablet (400 mg total) by mouth daily. 11/06/14   Chauncey Cruel, MD  RESTASIS 0.05 % ophthalmic emulsion INT 1 GTT IN OU BID 03/27/15   Historical Provider, MD  Vitamin D, Ergocalciferol, (DRISDOL) 50000 UNITS CAPS capsule TAKE 1 CAPSULE BY MOUTH EVERY 7 DAYS 04/17/15   Megan Salon, MD   BP 164/73 mmHg  Pulse 87  Temp(Src) 98.3 F (36.8 C) (Oral)  Resp 18  Ht 5\' 5"  (1.651 m)  Wt 81.194 kg  BMI 29.79 kg/m2  SpO2 100%  LMP  08/02/1995 Physical Exam  Constitutional: She is oriented to person, place, and time. She appears well-developed and well-nourished.  HENT:  Head: Normocephalic. Head is with laceration. Head is without raccoon's eyes, without Battle's sign, without abrasion and without contusion.    Right Ear: Tympanic membrane normal. No hemotympanum.  Left Ear: Tympanic membrane normal. No hemotympanum.  Nose: Nose normal.  Mouth/Throat: Uvula is midline, oropharynx is clear and moist and mucous membranes are normal. No oropharyngeal exudate.  Eyes: Conjunctivae and EOM are normal. Pupils are equal, round, and reactive to light. Right eye exhibits no discharge. Left eye exhibits no discharge. No scleral icterus.  Neck: Normal range of motion. Neck supple.  Cardiovascular: Normal rate, regular rhythm, normal heart sounds and intact distal pulses.   Pulmonary/Chest: Effort normal and breath sounds normal. No respiratory distress. She has no wheezes. She has no rales. She exhibits no tenderness.  Abdominal: Soft. Bowel sounds are normal. She exhibits no distension and no mass. There is no tenderness. There is no rebound and no guarding.  Musculoskeletal: Normal range of motion. She exhibits no edema.  No C/T/L midline tenderness. FROM of neck and back.  Lymphadenopathy:    She has no cervical adenopathy.  Neurological: She is alert and oriented to person, place, and time. No cranial nerve deficit. Coordination normal.  Skin: Skin is warm and dry.  Nursing note and vitals reviewed.   ED Course  Procedures (including critical care time) Labs Review Labs Reviewed - No data to display  Imaging Review Ct Head Wo Contrast  09/14/2015  CLINICAL DATA:  75 year old female status post fall with large scalp laceration EXAM: CT HEAD WITHOUT CONTRAST TECHNIQUE: Contiguous axial images were obtained from the base of the skull through the vertex without intravenous contrast. COMPARISON:  Prior head CT 04/16/2012  FINDINGS: Negative for acute intracranial hemorrhage, acute infarction, mass, mass effect, hydrocephalus or midline shift. Gray-white differentiation is preserved throughout. Soft tissue irregularity left parietal scalp consistent with the clinical history of laceration. No significant hematoma. No calvarial fracture. Globes and orbits are intact and unremarkable bilaterally. Normal aeration of the mastoid air cells and paranasal sinuses. IMPRESSION: 1. No acute intracranial abnormality. 2. Very mild chronic microvascular ischemic white matter disease and age related cortical cerebral volume loss. 3. Left parietal scalp laceration without significant hematoma or underlying fracture. Electronically Signed  By: Jacqulynn Cadet M.D.   On: 09/14/2015 21:30   I have personally reviewed and evaluated these images and lab results as part of my medical decision-making.  LACERATION REPAIR Performed by: Nona Dell Authorized by: Nona Dell Consent: Verbal consent obtained. Risks and benefits: risks, benefits and alternatives were discussed Consent given by: patient Patient identity confirmed: provided demographic data Prepped and Draped in normal sterile fashion Wound explored  Laceration Location: left scalp  Laceration Length: 5cm  No Foreign Bodies seen or palpated  Anesthesia: none  Irrigation method: syringe Amount of cleaning: standard  Skin closure: sutures  Number of sutures: 10  Patient tolerance: Patient tolerated the procedure well with no immediate complications.    Filed Vitals:   09/14/15 2211 09/14/15 2303  BP: 184/74 164/73  Pulse: 91 87  Temp:    Resp: 18      MDM   Final diagnoses:  Fall, initial encounter  Head injury, initial encounter  Laceration    Pt presents s/p fall with head laceration, bleeding controlled PTA. Denies use of anticoagulants, no LOC. VSS. Exam revealed 5cm laceration to left parietal scalp, no active  bleeding, no neuro deficits, no other evidence of trauma/injury, no midline cervical tenderness. CT head showed no acute intracranial abnormality. Wound cleaned and irrigated and staples placed without any complications. Discussed wound care with pt and advised pt to return in 7 days for staple removal.   Evaluation does not show pathology requring ongoing emergent intervention or admission. Pt is hemodynamically stable and mentating appropriately. Discussed findings/results and plan with patient/guardian, who agrees with plan. All questions answered. Return precautions discussed and outpatient follow up given.      Chesley Noon [place], [place] 09/14/15 [place], MD 09/15/15 0000

## 2015-09-14 NOTE — ED Notes (Signed)
Pt returned from CT °

## 2015-09-14 NOTE — ED Notes (Signed)
Bed: [facility] Expected date:  Expected time:  Means of arrival:  Comments: Ems

## 2015-09-14 NOTE — ED Notes (Signed)
Pt BIB EMS; pt was walking out of building and got her jacket stuck on the door; pt lost balance and fell backward and hit head on brick wall; pt has 6 inch laceration on left upper side of head; EMS states bleeding is controlled and head has been wrapped; pt denies LOC, back pain, and neck pain; pt is not on any blood thinners; pt alert and oriented.

## 2015-09-14 NOTE — Progress Notes (Signed)
CSW met with patient at bedside. Husband and son were present. Patient confirms that she presents to [facility] due to fall. Patient also confirms that she fell due to jacket being stuck on the door.  Patient states she lives at home with her husband in [place]. Patient states she completes all of her ADL's independently, and says she is not interested in facility information or speaking with someone regarding rehab. Patient has a good support system.  Husband/James [phone] Son/Corey [phone]  Willette Brace [phone] ED CSW 09/14/2015 9:47 PM

## 2015-09-14 NOTE — Discharge Instructions (Signed)
Continue taking your home medications as prescribed. Keep your wound/staples clean using antibacterial soap and water with gently washing, then pat dry. You may place a small amount of antibacterial ointment (Bacitracin) on the wound daily. You may take Tylenol as prescribed over the counter as needed for pain relief. Return to the Emergency department or you may be seen by your primary care provider in 7 days for suture removal.  Please return to the Emergency Department if symptoms worsen or new onset of fever, headache, visual changes, lightheadedness, dizziness, numbness, tingling, weakness, syncope.

## 2015-09-22 ENCOUNTER — Encounter (HOSPITAL_BASED_OUTPATIENT_CLINIC_OR_DEPARTMENT_OTHER): Payer: Self-pay | Admitting: Emergency Medicine

## 2015-09-22 ENCOUNTER — Emergency Department (HOSPITAL_BASED_OUTPATIENT_CLINIC_OR_DEPARTMENT_OTHER)
Admission: EM | Admit: 2015-09-22 | Discharge: 2015-09-22 | Disposition: A | Discharge status: Home / Self Care | Payer: Medicare Other | Attending: Emergency Medicine | Admitting: Emergency Medicine

## 2015-09-22 DIAGNOSIS — Z923 Personal history of irradiation: Secondary | ICD-10-CM | POA: Insufficient documentation

## 2015-09-22 DIAGNOSIS — Z8742 Personal history of other diseases of the female genital tract: Secondary | ICD-10-CM | POA: Diagnosis not present

## 2015-09-22 DIAGNOSIS — E039 Hypothyroidism, unspecified: Secondary | ICD-10-CM | POA: Diagnosis not present

## 2015-09-22 DIAGNOSIS — Z79811 Long term (current) use of aromatase inhibitors: Secondary | ICD-10-CM | POA: Diagnosis not present

## 2015-09-22 DIAGNOSIS — Z79899 Other long term (current) drug therapy: Secondary | ICD-10-CM | POA: Diagnosis not present

## 2015-09-22 DIAGNOSIS — Z853 Personal history of malignant neoplasm of breast: Secondary | ICD-10-CM | POA: Insufficient documentation

## 2015-09-22 DIAGNOSIS — Z4802 Encounter for removal of sutures: Secondary | ICD-10-CM | POA: Insufficient documentation

## 2015-09-22 DIAGNOSIS — Z792 Long term (current) use of antibiotics: Secondary | ICD-10-CM | POA: Insufficient documentation

## 2015-09-22 DIAGNOSIS — Z8781 Personal history of (healed) traumatic fracture: Secondary | ICD-10-CM | POA: Diagnosis not present

## 2015-09-22 DIAGNOSIS — Z85828 Personal history of other malignant neoplasm of skin: Secondary | ICD-10-CM | POA: Diagnosis not present

## 2015-09-22 NOTE — ED Provider Notes (Signed)
CSN: [id number]     Arrival date & time 09/22/15  0909 History   First MD Initiated Contact with Patient 09/22/15 0912     Chief Complaint  Patient presents with  . Suture / Staple Removal     (Consider location/radiation/quality/duration/timing/severity/associated sxs/prior Treatment) HPI Jamie Cole is a 75 y.o. female who presents to ED for staple removal. Pt had a mechanical fall 8 days ago, states fell after her jacket got stuck on a door and she fell backwards hitting her head on the brick wall. Pt sustained laceration to the scalp which was repaired with staples in emergency dept. Pt states area is still tender, however denies any bleeding, drainage, fever, chills, malaise. Denies headaches or dizziness. Pt has not cleaning the area or applying any ointments to it. Requesting staples out.   Past Medical History  Diagnosis Date  . Endometrial hyperplasia, simple   . Endometrial polyp   . Hypothyroidism   . Basal cell carcinoma of neck        . Breast cancer ([place]) 08/31/11    DCIS; right breast, ER/PR +  . Hx of radiation therapy 10/18/11 to 11/18/11    right breast  . Hx of radiation therapy 1957    4 tx to face for acne  . Metacarpal bone fracture 08/30/2012    Right 5th finger fracture  . H/O TB skin testing 1970's    + INH rx  . Breast cancer ([facility]) 2/13    9 MM R breast- E+/P+ HVR 2-  . Fracture     right arm   Past Surgical History  Procedure Laterality Date  . Endometrial polyp    . Tonsillectomy      adenoids  . Mastectomy, partial  09/14/11    right breast  . Hysteroscopy  12/00    polyps  . Breast lumpectomy  2/13    sentinel node   Family History  Problem Relation Age of Onset  . Skin cancer Father 51  . Breast cancer Paternal Aunt     x 2  . Stomach cancer Paternal Grandmother 75  . Breast cancer Cousin     ages 82-60 at dx x 4 paternal  . Prostate cancer Cousin     ????  . Brain cancer Maternal Uncle     brain  . Cancer Cousin     tonsillar   . Colon cancer Neg Hx   . Stroke Father   . Stroke Brother    Social History  Substance Use Topics  . Smoking status: Never Smoker   . Smokeless tobacco: Never Used  . Alcohol Use: No   OB History    Gravida Para Term Preterm AB TAB SAB Ectopic Multiple Living   1 1 1       1       Obstetric Comments   Menarche age 53, menopause 70, HRT x 2-3 yrs     Review of Systems  Constitutional: Negative for fever and chills.  Respiratory: Negative for cough, chest tightness and shortness of breath.   Cardiovascular: Negative for chest pain, palpitations and leg swelling.  Musculoskeletal: Negative for neck pain.  Skin: Positive for wound. Negative for rash.  Neurological: Negative for dizziness, weakness and headaches.  All other systems reviewed and are negative.     Allergies  Other  Home Medications   Prior to Admission medications   Medication Sig Start Date End Date Taking? Authorizing Provider  anastrozole (ARIMIDEX) 1 MG tablet TAKE 1 TABLET  BY MOUTH DAILY 07/10/15   Chauncey Cruel, MD  Ascorbic Acid (VITAMIN C) 1000 MG tablet Take 1,000 mg by mouth daily.    Historical Provider, MD  calcium-vitamin D (SM CALCIUM 500/VITAMIN D3) 500-400 MG-UNIT per tablet Take 630 mg by mouth.    Historical Provider, MD  cephALEXin (KEFLEX) 500 MG capsule Take 1 capsule (500 mg total) by mouth 2 (two) times daily. 05/05/15   Sylvan Cheese, NP  levothyroxine (SYNTHROID) 112 MCG tablet Take 112 mcg by mouth daily before breakfast. Sunday and Wednesday 1/2 tab, 1 tablet all other days.    Historical Provider, MD  magnesium oxide (MAG-OX) 400 (241.3 MG) MG tablet Take 1 tablet (400 mg total) by mouth daily. 11/06/14   Chauncey Cruel, MD  RESTASIS 0.05 % ophthalmic emulsion INT 1 GTT IN OU BID 03/27/15   Historical Provider, MD  Vitamin D, Ergocalciferol, (DRISDOL) 50000 UNITS CAPS capsule TAKE 1 CAPSULE BY MOUTH EVERY 7 DAYS 04/17/15   Megan Salon, MD   BP 142/56 mmHg  Pulse 81   Temp(Src) 98.4 F (36.9 C) (Oral)  Resp 16  Ht 5\' 5"  (1.651 m)  Wt 81.194 kg  BMI 29.79 kg/m2  SpO2 99%  LMP 08/02/1995 Physical Exam  Constitutional: She appears well-developed and well-nourished. No distress.  Eyes: Conjunctivae are normal. Pupils are equal, round, and reactive to light.  Neck: Neck supple.  Cardiovascular: Normal rate, regular rhythm and normal heart sounds.   Pulmonary/Chest: Effort normal and breath sounds normal. No respiratory distress. She has no wheezes. She has no rales.  Skin:  5cm healing and scabbed laceration to the left posterior scalp with 10 staples intact. No wound dehiscence. No bleeding, drainage. No ttp.   Nursing note and vitals reviewed.   ED Course  Procedures (including critical care time) Labs Review Labs Reviewed - No data to display  Imaging Review No results found. I have personally reviewed and evaluated these images and lab results as part of my medical decision-making.   EKG Interpretation None      SUTURE REMOVAL Performed by: Jeannett Senior A Consent: Verbal consent obtained. Patient identity confirmed: provided demographic data Time out: Immediately prior to procedure a "time out" was called to verify the correct patient, procedure, equipment, support staff and site/side marked as required. Location: left scalp Wound Appearance: clean Sutures/Staples Removed: 10 Patient tolerance: Patient tolerated the procedure well with no immediate complications.    MDM   Final diagnoses:  Encounter for staple removal   Pt in emergency dept for staple removal after a mechanical fall 8 days ago. She has no other complaints. No headache, dizziness, nausea. No signs of wound infection or dehiscence. Staples removed. Pt tolerated procedure well. Home with cont wound care. Follow up as needed.   Filed Vitals:   09/22/15 0921  BP: 142/56  Pulse: 81  Temp: 98.4 F (36.9 C)  TempSrc: Oral  Resp: 16  Height: 5\' 5"  (1.651 m)   Weight: 81.194 kg  SpO2: 99%      Jeannett Senior, PA-C 09/22/15 [place], MD 09/24/15 1453

## 2015-09-22 NOTE — ED Notes (Signed)
Pt has staples to the head, here to remove staples.  No problems

## 2015-09-22 NOTE — Discharge Instructions (Signed)
Keep your laceration clean. You can wash it with soap and water, however, do not pick at the scab or brush that part of your hair. Follow up as needed.     Suture Removal, Care After Refer to this sheet in the next few weeks. These instructions provide you with information on caring for yourself after your procedure. Your health care provider may also give you more specific instructions. Your treatment has been planned according to current medical practices, but problems sometimes occur. Call your health care provider if you have any problems or questions after your procedure. WHAT TO EXPECT AFTER THE PROCEDURE After your stitches (sutures) are removed, it is typical to have the following:  Some discomfort and swelling in the wound area.  Slight redness in the area. HOME CARE INSTRUCTIONS   If you have skin adhesive strips over the wound area, do not take the strips off. They will fall off on their own in a few days. If the strips remain in place after 14 days, you may remove them.  Change any bandages (dressings) at least once a day or as directed by your health care provider. If the bandage sticks, soak it off with warm, soapy water.  Apply cream or ointment only as directed by your health care provider. If using cream or ointment, wash the area with soap and water 2 times a day to remove all the cream or ointment. Rinse off the soap and pat the area dry with a clean towel.  Keep the wound area dry and clean. If the bandage becomes wet or dirty, or if it develops a bad smell, change it as soon as possible.  Continue to protect the wound from injury.  Use sunscreen when out in the sun. New scars become sunburned easily. SEEK MEDICAL CARE IF:  You have increasing redness, swelling, or pain in the wound.  You see pus coming from the wound.  You have a fever.  You notice a bad smell coming from the wound or dressing.  Your wound breaks open (edges not staying together).   This  information is not intended to replace advice given to you by your health care provider. Make sure you discuss any questions you have with your health care provider.   Document Released: 04/12/2001 Document Revised: 05/08/2013 Document Reviewed: 02/27/2013 Elsevier Interactive Patient Education [employer].

## 2015-10-10 ENCOUNTER — Other Ambulatory Visit: Payer: Self-pay | Admitting: Oncology

## 2015-10-13 ENCOUNTER — Other Ambulatory Visit: Payer: Self-pay

## 2015-10-16 DIAGNOSIS — H1789 Other corneal scars and opacities: Secondary | ICD-10-CM | POA: Diagnosis not present

## 2015-10-16 DIAGNOSIS — H2513 Age-related nuclear cataract, bilateral: Secondary | ICD-10-CM | POA: Diagnosis not present

## 2015-10-16 DIAGNOSIS — H5203 Hypermetropia, bilateral: Secondary | ICD-10-CM | POA: Diagnosis not present

## 2015-10-16 DIAGNOSIS — H1859 Other hereditary corneal dystrophies: Secondary | ICD-10-CM | POA: Diagnosis not present

## 2015-10-26 DIAGNOSIS — H1789 Other corneal scars and opacities: Secondary | ICD-10-CM | POA: Diagnosis not present

## 2015-10-29 ENCOUNTER — Other Ambulatory Visit: Payer: BC Managed Care – PPO

## 2015-10-29 ENCOUNTER — Other Ambulatory Visit (HOSPITAL_BASED_OUTPATIENT_CLINIC_OR_DEPARTMENT_OTHER): Payer: Medicare Other

## 2015-10-29 DIAGNOSIS — C50919 Malignant neoplasm of unspecified site of unspecified female breast: Secondary | ICD-10-CM

## 2015-10-29 LAB — COMPREHENSIVE METABOLIC PANEL
ALT: 12 U/L (ref 0–55)
AST: 16 U/L (ref 5–34)
Albumin: 3.9 g/dL (ref 3.5–5.0)
Alkaline Phosphatase: 83 U/L (ref 40–150)
Anion Gap: 9 mEq/L (ref 3–11)
BILIRUBIN TOTAL: 0.53 mg/dL (ref 0.20–1.20)
BUN: 16.2 mg/dL (ref 7.0–26.0)
CHLORIDE: 108 meq/L (ref 98–109)
CO2: 25 meq/L (ref 22–29)
CREATININE: 0.7 mg/dL (ref 0.6–1.1)
Calcium: 9.3 mg/dL (ref 8.4–10.4)
EGFR: 84 mL/min/{1.73_m2} — ABNORMAL LOW (ref >90)
GLUCOSE: 97 mg/dL (ref 70–140)
Potassium: 4.3 mEq/L (ref 3.5–5.1)
SODIUM: 142 meq/L (ref 136–145)
TOTAL PROTEIN: 6.6 g/dL (ref 6.4–8.3)

## 2015-10-29 LAB — CBC WITH DIFFERENTIAL/PLATELET
BASO%: 2.5 % — ABNORMAL HIGH (ref 0.0–2.0)
Basophils Absolute: 0.1 10*3/uL (ref 0.0–0.1)
EOS%: 5.2 % (ref 0.0–7.0)
Eosinophils Absolute: 0.3 10*3/uL (ref 0.0–0.5)
HCT: 43.4 % (ref 34.8–46.6)
HGB: 14.3 g/dL (ref 11.6–15.9)
LYMPH%: 32.3 % (ref 14.0–49.7)
MCH: 31.1 pg (ref 25.1–34.0)
MCHC: 33 g/dL (ref 31.5–36.0)
MCV: 94.4 fL (ref 79.5–101.0)
MONO#: 0.6 10*3/uL (ref 0.1–0.9)
MONO%: 10.6 % (ref 0.0–14.0)
NEUT%: 49.4 % (ref 38.4–76.8)
NEUTROS ABS: 2.8 10*3/uL (ref 1.5–6.5)
Platelets: 226 10*3/uL (ref 145–400)
RBC: 4.6 10*6/uL (ref 3.70–5.45)
RDW: 13.5 % (ref 11.2–14.5)
WBC: 5.6 10*3/uL (ref 3.9–10.3)
lymph#: 1.8 10*3/uL (ref 0.9–3.3)

## 2015-11-05 ENCOUNTER — Telehealth: Payer: Self-pay | Admitting: Oncology

## 2015-11-05 ENCOUNTER — Ambulatory Visit (HOSPITAL_BASED_OUTPATIENT_CLINIC_OR_DEPARTMENT_OTHER): Payer: Medicare Other | Admitting: Oncology

## 2015-11-05 VITALS — BP 131/51 | HR 76 | Temp 97.8°F | Resp 18 | Ht 65.0 in | Wt 185.4 lb

## 2015-11-05 DIAGNOSIS — M8580 Other specified disorders of bone density and structure, unspecified site: Secondary | ICD-10-CM

## 2015-11-05 DIAGNOSIS — C50411 Malignant neoplasm of upper-outer quadrant of right female breast: Secondary | ICD-10-CM

## 2015-11-05 MED ORDER — ANASTROZOLE 1 MG PO TABS: 1.0000 mg | ORAL_TABLET | Freq: Every day | ORAL | Status: DC

## 2015-11-05 NOTE — Telephone Encounter (Signed)
appt made and avs printed °

## 2015-11-05 NOTE — Progress Notes (Signed)
Jamie Cole  Telephone:([phone] Fax:([phone]) [phone]  OFFICE PROGRESS NOTE   ID: DI JASMER   DOB: Jul 20, 1941  MR#: [id number]  OVZ#:[id number]   PCP: [name], MD GYN:  SU: Fanny Skates, M.D. RAD ONC: Rexene Edison, M.D.  CHIEF COMPLAINT: Estrogen receptor positive her early stage breast cancer   CURRENT TREATMENT: Anastrozole   BREAST CANCER ISTORY From Dr. Julien Girt new patient evaluation note dated 09/07/2011:  "This is a delightful 75 year old woman here today with her family for discussion of her recent diagnosis of breast cancer. She does undergo annual screening mammography. She did not take any abnormalities in her breast. A screening mammogram 08/15/2011 showed a possible mass in the right breast additional views were recommended. Last mammogram right breast ultrasound performed 08/31/2011 showed a suspicious 5 x 6 x 6 mm mass in the upper outer right breast with calcifications and a biopsy was recommended. Biopsy. performed 08/31/2011 demonstrated a invasive ductal cancer likely grade 2 HER-2 was negative the ratio 1.56 the tumor was strongly ER and PR positive. An MRI scans performed to 09/06/11 showed a mass in the right breast measuring 9 x 9 x 8 mm. No other lesions were seen. There is no other evidence of adenopathy. A 1 cm lesion in the left lobe of the liver was seen likely a cyst."    Her subsequent history is as detailed below.    INTERVAL HISTORY: Jamie Cole returns today for followup of herEstrogen receptor positive breast cancer accompanied by her husband Clair Gulling. She is starting her fifth year of anastrozole, which she tolerates well. Hot flashes and vaginal dryness or not a problem. She never experienced the arthralgias and myalgias that many patients can have on this drug. She obtains it at a very good price.  REVIEW OF SYSTEMS: Lila sweater got caught in a door handle in February and it through her down, causing a significant scalp lesion,  which required 10 stitches. However there was no internal bleeding. She did fine with that. She had I surgery within the last 2 weeks and that went well. She is not currently exercising. A detailed review of systems was otherwise stable  PAST MEDICAL HISTORY: Past Medical History  Diagnosis Date  . Endometrial hyperplasia, simple   . Endometrial polyp   . Hypothyroidism   . Basal cell carcinoma of neck        . Breast cancer ([facility]) 08/31/11    DCIS; right breast, ER/PR +  . Hx of radiation therapy 10/18/11 to 11/18/11    right breast  . Hx of radiation therapy 1957    4 tx to face for acne  . Metacarpal bone fracture 08/30/2012    Right 5th finger fracture  . H/O TB skin testing 1970's    + INH rx  . Breast cancer ([facility]) 2/13    9 MM R breast- E+/P+ HVR 2-  . Fracture     right arm    PAST SURGICAL HISTORY: Past Surgical History  Procedure Laterality Date  . Endometrial polyp    . Tonsillectomy      adenoids  . Mastectomy, partial  09/14/11    right breast  . Hysteroscopy  12/00    polyps  . Breast lumpectomy  2/13    sentinel node    FAMILY HISTORY Family History  Problem Relation Age of Onset  . Skin cancer Father 45  . Breast cancer Paternal Aunt     x 2  . Stomach cancer  Paternal Grandmother 3  . Breast cancer Cousin     ages 15-60 at dx x 4 paternal  . Prostate cancer Cousin     ????  . Brain cancer Maternal Uncle     brain  . Cancer Cousin     tonsillar  . Colon cancer Neg Hx   . Stroke Father   . Stroke Brother     GYNECOLOGIC HISTORY: G1P1, menarche 41, menopause 1, HRT x 2-3y w/o unusual complications  SOCIAL HISTORY: The patient and her husband Clair Gulling have been married since 2.  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 Patient's son is an Forensic psychologist in [employer] and is a Occupational psychologist. The patient has no grandchildren but 4 grandcats. In her spare time the patient enjoys traveling, visiting with friends and  family and crocheting.   ADVANCED DIRECTIVES: in place  HEALTH MAINTENANCE: Social History  Substance Use Topics  . Smoking status: Never Smoker   . Smokeless tobacco: Never Used  . Alcohol Use: No    Colonoscopy: 09/2011 PAP: 11/2011 Bone density: showed osteopenia with T -1.9 Lipid panel:  Not on file  Allergies  Allergen Reactions  . Other     PLEASE DO BLOOD PRESSURE IN LEFT ARM ONLY    Current Outpatient Prescriptions  Medication Sig Dispense Refill  . anastrozole (ARIMIDEX) 1 MG tablet TAKE 1 TABLET BY MOUTH DAILY 90 tablet 0  . Ascorbic Acid (VITAMIN C) 1000 MG tablet Take 1,000 mg by mouth daily.    . calcium-vitamin D (SM CALCIUM 500/VITAMIN D3) 500-400 MG-UNIT per tablet Take 630 mg by mouth.    . cephALEXin (KEFLEX) 500 MG capsule Take 1 capsule (500 mg total) by mouth 2 (two) times daily. 14 capsule 0  . levothyroxine (SYNTHROID) 112 MCG tablet Take 112 mcg by mouth daily before breakfast. Sunday and Wednesday 1/2 tab, 1 tablet all other days.    . magnesium oxide (MAG-OX) 400 (241.3 MG) MG tablet Take 1 tablet (400 mg total) by mouth daily.    . RESTASIS 0.05 % ophthalmic emulsion INT 1 GTT IN OU BID  0  . Vitamin D, Ergocalciferol, (DRISDOL) 50000 UNITS CAPS capsule TAKE 1 CAPSULE BY MOUTH EVERY 7 DAYS 12 capsule 4   No current facility-administered medications for this visit.    OBJECTIVE: Older white woman who appears stated age 16 Vitals:   11/05/15 1333  BP: 131/51  Pulse: 76  Temp: 97.8 F (36.6 C)  Resp: 18     Body mass index is 30.85 kg/(m^2).      ECOG FS: 1 - Symptomatic but completely ambulatory  Sclerae unicteric, EOMs intact Oropharynx clear, dentition in good repair No cervical or supraclavicular adenopathy Lungs no rales or rhonchi Heart regular rate and rhythm Abd soft, Obese,nontender, positive bowel sounds MSK no focal spinal tenderness, no upper extremity lymphedema Neuro: nonfocal, well oriented, positive affect Breasts:  the right breast is status post lumpectomy and radiation. There is no evidence of local recurrence. The right axilla is benign. The left breast is unremarkable  LAB RESULTS: Lab Results  Component Value Date   WBC 5.6 10/29/2015   NEUTROABS 2.8 10/29/2015   HGB 14.3 10/29/2015   HCT 43.4 10/29/2015   MCV 94.4 10/29/2015   PLT 226 10/29/2015      Chemistry      Component Value Date/Time   NA 142 10/29/2015 0754   NA 140 10/04/2011 2159   K 4.3 10/29/2015 0754  K 4.0 10/04/2011 2159   CL 106 01/01/2013 0845   CL 105 10/04/2011 2159   CO2 25 10/29/2015 0754   CO2 26 10/04/2011 2159   BUN 16.2 10/29/2015 0754   BUN 21 10/04/2011 2159   CREATININE 0.7 10/29/2015 0754   CREATININE 0.60 10/04/2011 2159      Component Value Date/Time   CALCIUM 9.3 10/29/2015 0754   CALCIUM 9.4 10/04/2011 2159   ALKPHOS 83 10/29/2015 0754   ALKPHOS 102 10/04/2011 2159   AST 16 10/29/2015 0754   AST 14 10/04/2011 2159   ALT 12 10/29/2015 0754   ALT 11 10/04/2011 2159   BILITOT 0.53 10/29/2015 0754   BILITOT 0.1* 10/04/2011 2159      Lab Results  Component Value Date   LABCA2 18 06/25/2012    Urinalysis    Component Value Date/Time   COLORURINE YELLOW 09/14/2011 1013   APPEARANCEUR CLOUDY* 09/14/2011 1013   LABSPEC >1.030* 09/14/2011 1013   PHURINE 5.0 09/14/2011 1013   GLUCOSEU NEGATIVE 09/14/2011 1013   HGBUR NEGATIVE 09/14/2011 1013   BILIRUBINUR SMALL* 09/14/2011 1013   KETONESUR 15* 09/14/2011 1013   PROTEINUR NEGATIVE 09/14/2011 1013   UROBILINOGEN 0.2 09/14/2011 1013   NITRITE NEGATIVE 09/14/2011 [place] 09/14/2011 1013    STUDIES: CLINICAL DATA: History of right breast cancer status post lumpectomy in 2013.  EXAM: DIGITAL DIAGNOSTIC BILATERAL MAMMOGRAM WITH 3D TOMOSYNTHESIS WITH CAD  ULTRASOUND RIGHT BREAST  COMPARISON: Previous exam(s).  ACR Breast Density Category b: There are scattered areas of fibroglandular  density.  FINDINGS: Lumpectomy changes are seen in the upper-outer quadrant of the right breast. On the CC view there is a 3 mm asymmetry just lateral to the lumpectomy site. There are no associated malignant type microcalcifications.  Mammographic images were processed with CAD.  On physical exam, I do not palpate a mass in the 9 o'clock region of the right breast at the lumpectomy site.  Targeted ultrasound is performed, showing lumpectomy changes in the 9 o'clock region of the right breast 5 cm from the nipple. No discrete mass is identified in the lumpectomy site.  IMPRESSION: Asymmetry in the right breast is likely secondary to postoperative changes.  RECOMMENDATION: Short-term interval followup right mammogram in 6 months is recommended.  I have discussed the findings and recommendations with the patient. Results were also provided in writing at the conclusion of the visit. If applicable, a reminder letter will be sent to the patient regarding the next appointment.  BI-RADS CATEGORY 3: Probably benign.   Electronically Signed  By: Lillia Mountain M.D.  On: 08/24/2015 12:22     CT HEAD WITHOUT CONTRAST  TECHNIQUE: Contiguous axial images were obtained from the base of the skull through the vertex without intravenous contrast.  COMPARISON: Prior head CT 04/16/2012  FINDINGS: Negative for acute intracranial hemorrhage, acute infarction, mass, mass effect, hydrocephalus or midline shift. Gray-white differentiation is preserved throughout. Soft tissue irregularity left parietal scalp consistent with the clinical history of laceration. No significant hematoma. No calvarial fracture. Globes and orbits are intact and unremarkable bilaterally. Normal aeration of the mastoid air cells and paranasal sinuses.  IMPRESSION: 1. No acute intracranial abnormality. 2. Very mild chronic microvascular ischemic white matter disease and age related cortical  cerebral volume loss. 3. Left parietal scalp laceration without significant hematoma or underlying fracture.   Electronically Signed  By: Jacqulynn Cadet M.D.  On: 09/14/2015 21:30   .ASSESSMENT: 75 y.o. BRCA negative [place], [place] woman:  1.  Status post right breast core biopsy 08/31/2011 showing invasive ductal carcinoma, ER 100%, PR 100%, Ki-67 19%, HER-2/neu by CISH no amplification with a ratio 1.56.  2.  Status post right breast lumpectomy with right axillary sentinel node biopsy 09/14/2011 for a pT1b, pN0, stage IA invasive ductal carcinoma, grade 1, again HER-2/neu by CISH no amplification with ratio of 1.32  3. Status post radiation therapy from 10/18/2011 through 11/18/2011.  4. Started antiestrogen therapy with Arimidex in 10/2011.  5.  Osteopenia: Bone scan January 2016 shows a T score of -1.9  PLAN: Anju is doing fine from a breast cancer point of view, now 4 years out from her definitive surgery with no evidence of disease recurrence. This is very favorable.  She will be on anastrozole one more year and at that time likely we will discontinue follow-up with me.  However she appreciated the visit with survivorship. I will make her a return appointment with our survivorship nurse Practitioner for October of this year and then when Trenell returns to see me a year from now she can let me know if she wishes to continue and our survivorship program long-term  We also again discussed bisphosphonates and other options for her osteopenia. At this point she feels uncomfortable with adding any more medication and opts for continuing observation.  I encouraged her to start a regular walking program  She will return to see me in one year. She knows to call for any problems that may develop before then. 11/05/2015, 1:40 PM

## 2015-11-19 DIAGNOSIS — E784 Other hyperlipidemia: Secondary | ICD-10-CM | POA: Diagnosis not present

## 2015-11-19 DIAGNOSIS — E038 Other specified hypothyroidism: Secondary | ICD-10-CM | POA: Diagnosis not present

## 2015-11-19 DIAGNOSIS — R739 Hyperglycemia, unspecified: Secondary | ICD-10-CM | POA: Diagnosis not present

## 2015-11-19 DIAGNOSIS — M859 Disorder of bone density and structure, unspecified: Secondary | ICD-10-CM | POA: Diagnosis not present

## 2015-11-27 DIAGNOSIS — E038 Other specified hypothyroidism: Secondary | ICD-10-CM | POA: Diagnosis not present

## 2015-11-27 DIAGNOSIS — Z6830 Body mass index (BMI) 30.0-30.9, adult: Secondary | ICD-10-CM | POA: Diagnosis not present

## 2015-11-27 DIAGNOSIS — E7800 Pure hypercholesterolemia, unspecified: Secondary | ICD-10-CM | POA: Diagnosis not present

## 2015-11-27 DIAGNOSIS — Z1389 Encounter for screening for other disorder: Secondary | ICD-10-CM | POA: Diagnosis not present

## 2015-11-27 DIAGNOSIS — D0590 Unspecified type of carcinoma in situ of unspecified breast: Secondary | ICD-10-CM | POA: Diagnosis not present

## 2015-11-27 DIAGNOSIS — M859 Disorder of bone density and structure, unspecified: Secondary | ICD-10-CM | POA: Diagnosis not present

## 2015-11-27 DIAGNOSIS — Z0000 Encounter for general adult medical examination without abnormal findings: Secondary | ICD-10-CM | POA: Diagnosis not present

## 2015-11-27 DIAGNOSIS — R7302 Impaired glucose tolerance (oral): Secondary | ICD-10-CM | POA: Diagnosis not present

## 2015-11-27 DIAGNOSIS — M160 Bilateral primary osteoarthritis of hip: Secondary | ICD-10-CM | POA: Diagnosis not present

## 2015-11-27 DIAGNOSIS — E668 Other obesity: Secondary | ICD-10-CM | POA: Diagnosis not present

## 2015-11-27 DIAGNOSIS — K635 Polyp of colon: Secondary | ICD-10-CM | POA: Diagnosis not present

## 2015-12-21 DIAGNOSIS — H1789 Other corneal scars and opacities: Secondary | ICD-10-CM | POA: Diagnosis not present

## 2015-12-30 DIAGNOSIS — L820 Inflamed seborrheic keratosis: Secondary | ICD-10-CM | POA: Diagnosis not present

## 2015-12-30 DIAGNOSIS — D485 Neoplasm of uncertain behavior of skin: Secondary | ICD-10-CM | POA: Diagnosis not present

## 2016-01-08 ENCOUNTER — Other Ambulatory Visit: Payer: Self-pay | Admitting: Oncology

## 2016-01-21 DIAGNOSIS — R21 Rash and other nonspecific skin eruption: Secondary | ICD-10-CM | POA: Diagnosis not present

## 2016-01-21 DIAGNOSIS — W57XXXA Bitten or stung by nonvenomous insect and other nonvenomous arthropods, initial encounter: Secondary | ICD-10-CM | POA: Diagnosis not present

## 2016-01-21 DIAGNOSIS — Z6830 Body mass index (BMI) 30.0-30.9, adult: Secondary | ICD-10-CM | POA: Diagnosis not present

## 2016-02-05 ENCOUNTER — Ambulatory Visit
Admission: RE | Admit: 2016-02-05 | Discharge: 2016-02-05 | Disposition: A | Discharge status: Home / Self Care | Payer: Medicare Other | Source: Ambulatory Visit | Attending: Nurse Practitioner | Admitting: Nurse Practitioner

## 2016-02-05 ENCOUNTER — Other Ambulatory Visit: Payer: Self-pay | Admitting: Oncology

## 2016-02-05 DIAGNOSIS — C50411 Malignant neoplasm of upper-outer quadrant of right female breast: Secondary | ICD-10-CM

## 2016-02-05 DIAGNOSIS — R928 Other abnormal and inconclusive findings on diagnostic imaging of breast: Secondary | ICD-10-CM | POA: Diagnosis not present

## 2016-02-16 ENCOUNTER — Other Ambulatory Visit: Payer: Self-pay | Admitting: Oncology

## 2016-02-16 DIAGNOSIS — Z853 Personal history of malignant neoplasm of breast: Secondary | ICD-10-CM

## 2016-03-26 DIAGNOSIS — L03031 Cellulitis of right toe: Secondary | ICD-10-CM | POA: Diagnosis not present

## 2016-03-26 DIAGNOSIS — Z6829 Body mass index (BMI) 29.0-29.9, adult: Secondary | ICD-10-CM | POA: Diagnosis not present

## 2016-04-14 DIAGNOSIS — Z23 Encounter for immunization: Secondary | ICD-10-CM | POA: Diagnosis not present

## 2016-04-18 DIAGNOSIS — Z6829 Body mass index (BMI) 29.0-29.9, adult: Secondary | ICD-10-CM | POA: Diagnosis not present

## 2016-04-18 DIAGNOSIS — H01001 Unspecified blepharitis right upper eyelid: Secondary | ICD-10-CM | POA: Diagnosis not present

## 2016-04-25 ENCOUNTER — Other Ambulatory Visit: Payer: Self-pay | Admitting: Oncology

## 2016-05-03 ENCOUNTER — Encounter: Payer: BC Managed Care – PPO | Admitting: Nurse Practitioner

## 2016-05-05 ENCOUNTER — Ambulatory Visit (HOSPITAL_BASED_OUTPATIENT_CLINIC_OR_DEPARTMENT_OTHER): Payer: Medicare Other | Admitting: Adult Health

## 2016-05-05 ENCOUNTER — Encounter: Payer: Self-pay | Admitting: Adult Health

## 2016-05-05 VITALS — BP 108/55 | HR 83 | Temp 97.5°F | Resp 18 | Ht 65.0 in | Wt 174.8 lb

## 2016-05-05 DIAGNOSIS — M8580 Other specified disorders of bone density and structure, unspecified site: Secondary | ICD-10-CM

## 2016-05-05 DIAGNOSIS — Z780 Asymptomatic menopausal state: Secondary | ICD-10-CM

## 2016-05-05 DIAGNOSIS — Z170 Estrogen receptor positive status [ER+]: Secondary | ICD-10-CM

## 2016-05-05 DIAGNOSIS — C50411 Malignant neoplasm of upper-outer quadrant of right female breast: Secondary | ICD-10-CM | POA: Diagnosis not present

## 2016-05-05 DIAGNOSIS — Z79811 Long term (current) use of aromatase inhibitors: Secondary | ICD-10-CM

## 2016-05-05 NOTE — Progress Notes (Signed)
CLINIC:  Survivorship   REASON FOR VISIT:  Routine follow-up for history of breast cancer.   BRIEF ONCOLOGIC HISTORY:    Breast cancer of upper-outer quadrant of right female breast ([place])   08/15/2011 Mammogram    Right breast: possible mass is noted; further imaging needed.      08/31/2011 Breast US    Right breast: suspicious 5 x 6 x 6 mm mass in the UOQ with calcifications      08/31/2011 Initial Biopsy    Right breast needle core bx: Invasive ductal carcinoma with associated calcs, ER+ (100%), PR+ (100%), HER2/neu negative (ratio 1.56), Ki-67 19%, DCIS      09/06/2011 Breast MRI    Right breast: mass measuring 9 x 9 x 8 mm. No other lesions were seen. There is no evidence of adenopathy. 1 cm lesion in the left lobe of the liver, likely cyst.      09/06/2011 Clinical Stage    Stage IA: T1 N0      09/09/2011 Procedure    Genetic testing ([facility]): BRCA1/2: negative, no mutation detected.      09/14/2011 Definitive Surgery    Right lumpectomy / [facility] Dalbert Batman): invasive ductal carcinoma, grade 1, HER-2/neu repeated and remains negative (ratio 1.32), high grade DCIS with comedonecrosis and calcificiation, 4 LN negative for malignancy (0/4)      09/14/2011 Pathologic Stage    Stage IA: pT1b, pN0      10/18/2011 - 11/18/2014 Radiation Therapy    Adjuvant RT completed Valere Dross): Right breast 48 Gy over 24 fractions.       10/2011 -  Anti-estrogen oral therapy    Anastrozole 1 mg daily.  Planned duration of therapy 5 years.       INTERVAL HISTORY:  Ms. Bonneau presents to the Survivorship Clinic today for routine follow-up for her history of breast cancer.  Overall, she reports feeling quite well. She has some fatigue, but she relates this to not sleeping well lately. Her husband has been struggling with sleep disturbance, and they are having this evaluated with a sleep study soon. Aside from this, she is largely without complaints today. Her appetite is good. Her  energy levels are good. She is not exercising regularly, but is interested in starting a walking regimen again soon. She remains on the anastrozole, and continues to tolerate it well; denies arthralgias, hot flashes, or vaginal bleeding/dryness. She has not noted any new changes in her breasts.  They are looking forward to celebrating her husband's 83th her today next week.  REVIEW OF SYSTEMS:  Review of Systems  Constitutional: Positive for malaise/fatigue.  HENT: Negative.   Eyes: Negative.   Respiratory: Negative.   Cardiovascular: Negative.   Gastrointestinal: Negative.   Genitourinary: Negative.   Musculoskeletal: Negative.   Skin: Negative.   Neurological: Negative.   Endo/Heme/Allergies: Negative.   Psychiatric/Behavioral: The patient has insomnia.   GU: Denies vaginal bleeding, discharge, or dryness.  Breast: Denies any new nodularity, masses, tenderness, nipple changes, or nipple discharge.    A 14-point review of systems was completed and was negative, except as noted above.    PAST MEDICAL/SURGICAL HISTORY:  Past Medical History:  Diagnosis Date  . Basal cell carcinoma of neck       . Breast cancer ([place]) 08/31/11   DCIS; right breast, ER/PR +  . Breast cancer ([place]) 2/13   9 MM R breast- E+/P+ HVR 2-  . Endometrial hyperplasia, simple   . Endometrial polyp   . Fracture  right arm  . H/O TB skin testing 1970's   + INH rx  . Hx of radiation therapy 10/18/11 to 11/18/11   right breast  . Hx of radiation therapy 1957   4 tx to face for acne  . Hypothyroidism   . Metacarpal bone fracture 08/30/2012   Right 5th finger fracture   Past Surgical History:  Procedure Laterality Date  . BREAST LUMPECTOMY  2/13   sentinel node  . endometrial polyp    . HYSTEROSCOPY  12/00   polyps  . MASTECTOMY, PARTIAL  09/14/11   right breast  . TONSILLECTOMY     adenoids     ALLERGIES:  Allergies  Allergen Reactions  . Other     PLEASE DO BLOOD PRESSURE IN LEFT ARM ONLY      CURRENT MEDICATIONS:  Outpatient Encounter Prescriptions as of 05/05/2016  Medication Sig Note  . anastrozole (ARIMIDEX) 1 MG tablet TAKE 1 TABLET BY MOUTH DAILY   . Ascorbic Acid (VITAMIN C) 1000 MG tablet Take 1,000 mg by mouth daily.   . calcium-vitamin D (SM CALCIUM 500/VITAMIN D3) 500-400 MG-UNIT per tablet Take 630 mg by mouth. 04/17/2015: Received from: [facility]  . levothyroxine (SYNTHROID) 112 MCG tablet Take 112 mcg by mouth daily before breakfast. Sunday and Wednesday 1/2 tab, 1 tablet all other days.   . magnesium oxide (MAG-OX) 400 (241.3 MG) MG tablet Take 1 tablet (400 mg total) by mouth daily.   . Vitamin D, Ergocalciferol, (DRISDOL) 50000 UNITS CAPS capsule TAKE 1 CAPSULE BY MOUTH EVERY 7 DAYS   . [DISCONTINUED] anastrozole (ARIMIDEX) 1 MG tablet TAKE 1 TABLET BY MOUTH DAILY    No facility-administered encounter medications on file as of 05/05/2016.      ONCOLOGIC FAMILY HISTORY:  Family History  Problem Relation Age of Onset  . Skin cancer Father 56  . Breast cancer Paternal Aunt     x 2  . Stomach cancer Paternal Grandmother 28  . Breast cancer Cousin     ages 81-60 at dx x 4 paternal  . Prostate cancer Cousin     ????  . Brain cancer Maternal Uncle     brain  . Cancer Cousin     tonsillar  . Colon cancer Neg Hx   . Stroke Father   . Stroke Brother     GENETIC COUNSELING/TESTING: BRCA 1/2 negative.  SOCIAL HISTORY:  MIRZA KIDNEY is married and lives with her husband in [place], [place]. They have 1 son, who is an Forensic psychologist in [employer]. They have one 52-year-old granddaughter, Barnetta Chapel, who was adopted at birth. Ms. Collier is retired; she previously worked as a Pharmacist, hospital. She denies any current tobacco, alcohol, or illicit drug use.     PHYSICAL EXAMINATION:  Vital Signs: Vitals:   05/05/16 1327  BP: (!) 108/55  Pulse: 83  Resp: 18  Temp: 97.5 F (36.4 C)   Filed Weights   05/05/16 1327  Weight: 174 lb 12.8 oz (79.3 kg)   General:  Well-nourished, well-appearing female in no acute distress.  She is Accompanied by her husband today.   HEENT: Head is normocephalic.  Pupils equal and reactive to light. Conjunctivae clear without exudate.  Sclerae anicteric. Oral mucosa is pink, moist.  Oropharynx is pink without lesions or erythema.  Lymph: No cervical, supraclavicular, or infraclavicular lymphadenopathy noted on palpation.  Cardiovascular: Regular rate and rhythm.Marland Kitchen Respiratory: Clear to auscultation bilaterally. Chest expansion symmetric; breathing non-labored.  Breast Exam:  -Left breast: No  appreciable masses on palpation. No skin redness, thickening, or peau d'orange appearance; no nipple retraction or nipple discharge. -Right breast: No appreciable masses; mild tenderness on palpation, particularly at healed lumpectomy scar site. No skin redness, thickening, or peau d'orange appearance; no nipple retraction or nipple discharge. -Axilla: No axillary adenopathy bilaterally.  GI: Abdomen soft and round; non-tender, non-distended. Bowel sounds normoactive. No hepatosplenomegaly.   GU: Deferred.  Neuro: No focal deficits. Steady gait.  Psych: Mood and affect normal and appropriate for situation.  Extremities: No edema. Skin: Warm and dry.  LABORATORY DATA:  None for this visit   DIAGNOSTIC IMAGING:  Most recent mammogram: 02/05/16    ASSESSMENT AND PLAN:  Ms.. Coverdale is a pleasant 75 y.o. female with history of Stage IA right breast invasive ductal carcinoma, ER+/PR+/HER2-, diagnosed in 08/2011, treated with lumpectomy, adjuvant radiation therapy, and anti-estrogen therapy with anastrozole beginning in 10/2011.  She presents to the Survivorship Clinic for surveillance and routine follow-up.   1. History of Stage IA right breast cancer:  Ms. Hixon is currently clinically and radiographically without evidence of disease or recurrence of breast cancer. She is scheduled for since-month follow-up mammogram in 08/2016; they are  following a likely benign small asymmetry in her right breast near previous lumpectomy site. She will follow up with Dr. Jana Hakim in 10/2016 with history and physical exam per surveillance protocol. She will complete 5 years of her anti-estrogen therapy with anastrozole at that visit in 10/2016. We discussed that she could either "graduate" from follow-up here at the cancer center at that time, or she could continue to be seen annually in the survivorship program. She will discuss this further with Dr. Jana Hakim at her next visit, but is leaning towards continuing to be seen by Survivorship NP for continued surveillance. I encouraged her to call me with any questions or concerns that may come up before her next visit here at the [facility], and I'd be happy to see her sooner, if needed.  2. Bone health:  Given Ms. Osmon's age, history of breast cancer, and her current anti-estrogen therapy with anastrozole, she is at risk for bone demineralization. Her last DEXA scan was on 1/20/X seen and showed osteopenia.  Therefore she is due for by any old DEXA scan in 08/2016. She agreed to have DEXA scan done at that time; orders placed today. She remains on calcium and vitamin D supplementation, which I encouraged her to continue to take. I stress the importance of weight-bearing activities. I encouraged Ms. Goldsborough to start a walking regimen for exercise, as this will be important not only for her bone health but also for her overall health.       Dispo:  -Mammogram (6 month follow-up exam) scheduled for 08/24/16. -Biennial DEXA scan due in 08/2016 as well; orders placed today. -Return to cancer center to see Dr. Jana Hakim in 10/2016.   A total of 30 minutes of face-to-face time was spent with this patient with greater than 50% of that time in counseling and care-coordination.   Mike Craze, NP Survivorship Program [place] [phone]   Note: PRIMARY CARE PROVIDER [name], [place] [phone]

## 2016-07-13 DIAGNOSIS — Z85828 Personal history of other malignant neoplasm of skin: Secondary | ICD-10-CM | POA: Diagnosis not present

## 2016-07-13 DIAGNOSIS — L918 Other hypertrophic disorders of the skin: Secondary | ICD-10-CM | POA: Diagnosis not present

## 2016-07-13 DIAGNOSIS — Z23 Encounter for immunization: Secondary | ICD-10-CM | POA: Diagnosis not present

## 2016-07-13 DIAGNOSIS — D225 Melanocytic nevi of trunk: Secondary | ICD-10-CM | POA: Diagnosis not present

## 2016-07-13 DIAGNOSIS — L821 Other seborrheic keratosis: Secondary | ICD-10-CM | POA: Diagnosis not present

## 2016-07-13 DIAGNOSIS — Z86018 Personal history of other benign neoplasm: Secondary | ICD-10-CM | POA: Diagnosis not present

## 2016-07-13 DIAGNOSIS — L309 Dermatitis, unspecified: Secondary | ICD-10-CM | POA: Diagnosis not present

## 2016-07-13 DIAGNOSIS — L814 Other melanin hyperpigmentation: Secondary | ICD-10-CM | POA: Diagnosis not present

## 2016-07-13 DIAGNOSIS — D1801 Hemangioma of skin and subcutaneous tissue: Secondary | ICD-10-CM | POA: Diagnosis not present

## 2016-08-16 ENCOUNTER — Ambulatory Visit (INDEPENDENT_AMBULATORY_CARE_PROVIDER_SITE_OTHER): Payer: Medicare Other | Admitting: Obstetrics & Gynecology

## 2016-08-16 ENCOUNTER — Encounter: Payer: Self-pay | Admitting: Obstetrics & Gynecology

## 2016-08-16 VITALS — BP 124/80 | HR 72 | Resp 14 | Ht 64.0 in | Wt 177.0 lb

## 2016-08-16 DIAGNOSIS — E559 Vitamin D deficiency, unspecified: Secondary | ICD-10-CM

## 2016-08-16 DIAGNOSIS — Z124 Encounter for screening for malignant neoplasm of cervix: Secondary | ICD-10-CM | POA: Diagnosis not present

## 2016-08-16 DIAGNOSIS — Z01419 Encounter for gynecological examination (general) (routine) without abnormal findings: Secondary | ICD-10-CM

## 2016-08-16 MED ORDER — VITAMIN D (ERGOCALCIFEROL) 1.25 MG (50000 UNIT) PO CAPS: ORAL_CAPSULE | ORAL | 4 refills | Status: DC

## 2016-08-16 NOTE — Progress Notes (Signed)
76 y.o. G1P1001 MarriedCaucasianF here for annual exam.  Doing well.  Pt did have a scalp laceration last year and had to go to the ER this past year.  Will be seeing Dr. Doris Cheadle in April.  Will be done with her anastrazole in April.  Denies vaginal bleeding.    Patient's last menstrual period was 08/02/1995.          Sexually active: No.  The current method of family planning is post menopausal status.    Exercising: No.  The patient does not participate in regular exercise at present. Smoker:  no  Health Maintenance: Pap:  03/13/14 negative  History of abnormal Pap:  no MMG:  02/05/16 BIRADS 3: probably benign- has 6 month follow up scheduled for 08/24/16 Colonoscopy:  10/11/11 polyps- patient states no more recommended BMD:   08/20/14 osteopenia, has one scheduled for next week  TDaP:  05/10/14  Pneumonia vaccine(s):  10/04/05  Zostavax:   10/04/05  Hep C testing: not indicated  Screening Labs: PCP, Hb today: PCP, Urine today: PCP   reports that she has never smoked. She has never used smokeless tobacco. She reports that she does not drink alcohol or use drugs.  Past Medical History:  Diagnosis Date  . Basal cell carcinoma of neck       . Breast cancer ([place]) 08/31/11   DCIS; right breast, ER/PR +  . Breast cancer ([place]) 2/13   9 MM R breast- E+/P+ HVR 2-  . Endometrial hyperplasia, simple   . Endometrial polyp   . Fracture    right arm  . H/O TB skin testing 1970's   + INH rx  . Hx of radiation therapy 10/18/11 to 11/18/11   right breast  . Hx of radiation therapy 1957   4 tx to face for acne  . Hypothyroidism   . Metacarpal bone fracture 08/30/2012   Right 5th finger fracture    Past Surgical History:  Procedure Laterality Date  . BREAST LUMPECTOMY  2/13   sentinel node  . endometrial polyp    . HYSTEROSCOPY  12/00   polyps  . MASTECTOMY, PARTIAL  09/14/11   right breast  . TONSILLECTOMY     adenoids    Current Outpatient Prescriptions  Medication Sig Dispense Refill   . anastrozole (ARIMIDEX) 1 MG tablet TAKE 1 TABLET BY MOUTH DAILY 90 tablet 0  . Ascorbic Acid (VITAMIN C) 1000 MG tablet Take 1,000 mg by mouth daily.    . calcium-vitamin D (SM CALCIUM 500/VITAMIN D3) 500-400 MG-UNIT per tablet Take 630 mg by mouth.    . levothyroxine (SYNTHROID) 112 MCG tablet Take 112 mcg by mouth daily before breakfast. Sunday and Wednesday 1/2 tab, 1 tablet all other days.    . magnesium oxide (MAG-OX) 400 (241.3 MG) MG tablet Take 1 tablet (400 mg total) by mouth daily.    Marland Kitchen triamcinolone cream (KENALOG) 0.1 % APP EXT AA BID FOR 14 DAYS  0  . Vitamin D, Ergocalciferol, (DRISDOL) 50000 UNITS CAPS capsule TAKE 1 CAPSULE BY MOUTH EVERY 7 DAYS 12 capsule 4   No current facility-administered medications for this visit.     Family History  Problem Relation Age of Onset  . Skin cancer Father 66  . Breast cancer Paternal Aunt     x 2  . Stomach cancer Paternal Grandmother 27  . Breast cancer Cousin     ages 4-60 at dx x 4 paternal  . Prostate cancer Cousin     ????  .  Brain cancer Maternal Uncle     brain  . Cancer Cousin     tonsillar  . Colon cancer Neg Hx   . Stroke Father   . Stroke Brother     ROS:  Pertinent items are noted in HPI.  Otherwise, a comprehensive ROS was negative.  Exam:   BP 124/80 (BP Location: Left Arm, Patient Position: Sitting, Cuff Size: Normal)   Pulse 72   Resp 14   Ht 5\' 4"  (1.626 m)   Wt 177 lb (80.3 kg)   LMP 08/02/1995   BMI 30.38 kg/m     Height: 5\' 4"  (162.6 cm)  Ht Readings from Last 3 Encounters:  08/16/16 5\' 4"  (1.626 m)  05/05/16 5\' 5"  (1.651 m)  11/05/15 5\' 5"  (1.651 m)    General appearance: alert, cooperative and appears stated age Head: Normocephalic, without obvious abnormality, atraumatic Neck: no adenopathy, supple, symmetrical, trachea midline and thyroid normal to inspection and palpation Lungs: clear to auscultation bilaterally Breasts: normal appearance, no masses or tenderness Heart: regular rate  and rhythm Abdomen: soft, non-tender; bowel sounds normal; no masses,  no organomegaly Extremities: extremities normal, atraumatic, no cyanosis or edema Skin: Skin color, texture, turgor normal. No rashes or lesions Lymph nodes: Cervical, supraclavicular, and axillary nodes normal. No abnormal inguinal nodes palpated Neurologic: Grossly normal   Pelvic: External genitalia:  no lesions              Urethra:  normal appearing urethra with no masses, tenderness or lesions              Bartholins and Skenes: normal                 Vagina: normal appearing vagina with normal color and discharge, no lesions              Cervix: no lesions              Pap taken: Yes.   Bimanual Exam:  Uterus:  normal size, contour, position, consistency, mobility, non-tender              Adnexa: normal adnexa and no mass, fullness, tenderness               Rectovaginal: Confirms               Anus:  normal sphincter tone, no lesions  Chaperone was present for exam.  A:  Well Woman with normal exam  H/o 1A breast cancer, Er/PR +, s/p lumpectomy and radiation 2013, on Arimidex for five full years.  Will stop this in April. Remote hx of endometrial hyperplasia 2000.  No recent bleeding Osteopenia, wrist fx 2/14 from fall  Vit D deficiency Hypothyroidism   P:  Mammogram yearly.  Has follow up scheduled next week from July BMD is already scheduled for next week. Pap 2015.  No pap smear obtained today. Rx for Vit D 50,000 IU weekly.   Vit D level obtained today Return annually or prn

## 2016-08-17 LAB — VITAMIN D 25 HYDROXY (VIT D DEFICIENCY, FRACTURES): VIT D 25 HYDROXY: 46 ng/mL (ref 30–100)

## 2016-08-17 LAB — IPS PAP SMEAR ONLY

## 2016-08-24 ENCOUNTER — Ambulatory Visit
Admission: RE | Admit: 2016-08-24 | Discharge: 2016-08-24 | Disposition: A | Discharge status: Home / Self Care | Payer: Medicare Other | Source: Ambulatory Visit | Attending: Oncology | Admitting: Oncology

## 2016-08-24 ENCOUNTER — Telehealth: Payer: Self-pay | Admitting: Adult Health

## 2016-08-24 ENCOUNTER — Ambulatory Visit
Admission: RE | Admit: 2016-08-24 | Discharge: 2016-08-24 | Disposition: A | Discharge status: Home / Self Care | Payer: Medicare Other | Source: Ambulatory Visit | Attending: Adult Health | Admitting: Adult Health

## 2016-08-24 DIAGNOSIS — M810 Age-related osteoporosis without current pathological fracture: Secondary | ICD-10-CM | POA: Diagnosis not present

## 2016-08-24 DIAGNOSIS — Z79811 Long term (current) use of aromatase inhibitors: Secondary | ICD-10-CM

## 2016-08-24 DIAGNOSIS — Z853 Personal history of malignant neoplasm of breast: Secondary | ICD-10-CM

## 2016-08-24 DIAGNOSIS — R928 Other abnormal and inconclusive findings on diagnostic imaging of breast: Secondary | ICD-10-CM | POA: Diagnosis not present

## 2016-08-24 DIAGNOSIS — Z780 Asymptomatic menopausal state: Secondary | ICD-10-CM

## 2016-08-24 NOTE — Telephone Encounter (Signed)
Called patient twice to give her the results of her bone density report, and discuss treatment options.  Could not get answer, and could not leave message.    Charlestine Massed, NP

## 2016-08-25 ENCOUNTER — Telehealth: Payer: Self-pay | Admitting: Adult Health

## 2016-08-26 ENCOUNTER — Other Ambulatory Visit: Payer: Self-pay | Admitting: Adult Health

## 2016-08-26 ENCOUNTER — Ambulatory Visit (HOSPITAL_COMMUNITY)
Admission: RE | Admit: 2016-08-26 | Discharge: 2016-08-26 | Disposition: A | Discharge status: Home / Self Care | Payer: Medicare Other | Source: Ambulatory Visit | Attending: Adult Health | Admitting: Adult Health

## 2016-08-26 ENCOUNTER — Telehealth: Payer: Self-pay | Admitting: Adult Health

## 2016-08-26 DIAGNOSIS — M4316 Spondylolisthesis, lumbar region: Secondary | ICD-10-CM | POA: Insufficient documentation

## 2016-08-26 DIAGNOSIS — S32010A Wedge compression fracture of first lumbar vertebra, initial encounter for closed fracture: Secondary | ICD-10-CM | POA: Diagnosis present

## 2016-08-26 DIAGNOSIS — M47816 Spondylosis without myelopathy or radiculopathy, lumbar region: Secondary | ICD-10-CM | POA: Insufficient documentation

## 2016-08-26 NOTE — Telephone Encounter (Signed)
Spoke with patient for approximately 40 minutes regarding her bone density report.  I informed her that it indicated osteoporosis and was down from osteopenia when her last bone density was completed a couple of years ago.  I informed her that she is at risk for osteoporosis due to her current medication: Anastrozole that is due to stop in April, 2018.  I informed her that I discussed the osteoporosis with Dr. Jana Hakim in detail and that patient will need to get dental clearance and then we would like to start bisphosphanate therapy.  I discussed that we would like to start Prolia.  We also discussed oral options such as Fosamax.  I also informed patient that the bone density revealed a POSSIBLE compression fracture in her L1 vertebral body.  She denies any pain, but does note a height loss.  Would like radiographs to be sure.  I ordered these at [facility] and she states she will come on 08/26/2016 after her hair appointment mid-morning.  Should patient have any questions or concerns, she has my number to contact me if needed.  Once she has had dental evaluation, she knows to call me so I can order Prolia and we can schedule it.  She is due for f/u with Dr. Jana Hakim in April, 2018.      Charlestine Massed, NP

## 2016-08-26 NOTE — Telephone Encounter (Signed)
Patient called and wanted more information about her dental evaluation due since we want to start her on prolia.  Her spine xrays reveal a chronic t12 compression fracture.  I reviewed that with the patient and offered her reassurance to her.  Patient to start prolia, but contacting her dentist.  Once I verify her dental evaluation, I will order Prolia and send a message to scheduling.     Jamie Massed, NP

## 2016-08-29 ENCOUNTER — Other Ambulatory Visit: Payer: Self-pay | Admitting: Adult Health

## 2016-08-29 ENCOUNTER — Telehealth: Payer: Self-pay | Admitting: *Deleted

## 2016-08-29 ENCOUNTER — Other Ambulatory Visit: Payer: Self-pay | Admitting: Oncology

## 2016-08-29 DIAGNOSIS — M818 Other osteoporosis without current pathological fracture: Secondary | ICD-10-CM | POA: Insufficient documentation

## 2016-08-29 DIAGNOSIS — T386X5A Adverse effect of antigonadotrophins, antiestrogens, antiandrogens, not elsewhere classified, initial encounter: Principal | ICD-10-CM

## 2016-08-29 NOTE — Telephone Encounter (Signed)
Telephone call to patient to check on Dental evaluation needed to start Prolia. Pt states she had this completed today. The dentist will forward a completed note to this clinic today. Pt states she is anxious about a diagnosis of osteoporosis. Her questions were answered. She confirms her appointment in April with Dr. Jana Hakim and understands to contact this office with any further questions or concerns.

## 2016-09-01 ENCOUNTER — Telehealth: Payer: Self-pay | Admitting: Obstetrics & Gynecology

## 2016-09-01 ENCOUNTER — Other Ambulatory Visit: Payer: Self-pay | Admitting: Obstetrics & Gynecology

## 2016-09-01 NOTE — Telephone Encounter (Signed)
Dr. Sabra Heck -BMD 08/24/16 at [facility], can you review and advise?

## 2016-09-01 NOTE — Telephone Encounter (Signed)
Patient requesting that Dr Sabra Heck take a look at her bone density scan.

## 2016-09-01 NOTE — Telephone Encounter (Signed)
Patient is calling to give more information regarding her BMD. Patient said "Dr.Magrinat would like her to start taking Prolia for a few years and would this affect my Vitamin D dosage?Marland Kitchen

## 2016-09-02 ENCOUNTER — Telehealth: Payer: Self-pay | Admitting: Oncology

## 2016-09-02 NOTE — Telephone Encounter (Signed)
Unable to reach pt and lvm to inform pt of 2/16 lab/inj appt per LOS. appt letter mailed 2/2

## 2016-09-03 NOTE — Telephone Encounter (Signed)
Her spine measurement was a little lower.  She does not have osteoporosis at this time, however.  Dr. Jana Hakim is very conservative and careful with pt's on Anastrazole.  She will continue to take her Vit D and prolia will make her Vit D level any worse.  I hope that answers her questions.  Thanks.Marland Kitchen

## 2016-09-05 NOTE — Telephone Encounter (Signed)
Please let her know I was able to review entire BMD report.  She has a possible stress fracture in her spine and osteoporosis now on the bone density.  She absolutely needs treatment.  She should not wait until April to get started.  Thanks.

## 2016-09-05 NOTE — Telephone Encounter (Signed)
Returned call to patient, unable to leave message.

## 2016-09-05 NOTE — Telephone Encounter (Signed)
Spoke with patient. Advised as seen below per Dr. Sabra Heck. Patient states she is unclear about what she wants to do at this time. Patient states appointment is scheduled with Dr. Jana Hakim 4/1, I guess we can discuss options at that time. Advised patient can schedule appointment with Dr. Sabra Heck to discuss results in detail, patient declined at this time. Patient states let me think about it, will return call if needed. Patient verbalizes understanding and is agreeable.  Routing to provider for final review. Patient is agreeable to disposition. Will close encounter.

## 2016-09-05 NOTE — Telephone Encounter (Signed)
Maybe I should be more clear.  I do not have any issues with her using the Prolia at this time.  It will keep her from developing osteoporosis.  She can wait and discuss with Dr. Jana Hakim in April as well.

## 2016-09-06 NOTE — Telephone Encounter (Signed)
Spoke with patient, advised as seen below per Dr. Sabra Heck. Patient states she has decided to move forward with prolia. Patient states she has spoken with Dr. Virgie Dad office and is awaiting approval of medication through insurance. Patient states she just wanted to know if Dr. Sabra Heck approved. Patient states her April appointment is a f/u, should receive prolia prior to that appointment, provided covered by insurance. Patient verbalizes understanding and is agreeable. Patient is thankful for return call.  Routing to provider for final review. Patient is agreeable to disposition. Will close encounter.

## 2016-09-09 ENCOUNTER — Other Ambulatory Visit: Payer: Medicare Other

## 2016-09-09 ENCOUNTER — Ambulatory Visit: Payer: Medicare Other

## 2016-09-15 ENCOUNTER — Other Ambulatory Visit: Payer: Self-pay | Admitting: *Deleted

## 2016-09-15 DIAGNOSIS — C50411 Malignant neoplasm of upper-outer quadrant of right female breast: Secondary | ICD-10-CM

## 2016-09-15 DIAGNOSIS — Z170 Estrogen receptor positive status [ER+]: Principal | ICD-10-CM

## 2016-09-16 ENCOUNTER — Ambulatory Visit (HOSPITAL_BASED_OUTPATIENT_CLINIC_OR_DEPARTMENT_OTHER): Payer: Medicare Other

## 2016-09-16 ENCOUNTER — Other Ambulatory Visit: Payer: Self-pay | Admitting: Oncology

## 2016-09-16 ENCOUNTER — Other Ambulatory Visit (HOSPITAL_BASED_OUTPATIENT_CLINIC_OR_DEPARTMENT_OTHER): Payer: Medicare Other

## 2016-09-16 VITALS — BP 139/64 | HR 70 | Temp 98.1°F | Resp 16

## 2016-09-16 DIAGNOSIS — M818 Other osteoporosis without current pathological fracture: Secondary | ICD-10-CM

## 2016-09-16 DIAGNOSIS — M8580 Other specified disorders of bone density and structure, unspecified site: Secondary | ICD-10-CM

## 2016-09-16 DIAGNOSIS — Z170 Estrogen receptor positive status [ER+]: Principal | ICD-10-CM

## 2016-09-16 DIAGNOSIS — C50411 Malignant neoplasm of upper-outer quadrant of right female breast: Secondary | ICD-10-CM | POA: Diagnosis not present

## 2016-09-16 DIAGNOSIS — T386X5A Adverse effect of antigonadotrophins, antiestrogens, antiandrogens, not elsewhere classified, initial encounter: Principal | ICD-10-CM

## 2016-09-16 LAB — CBC WITH DIFFERENTIAL/PLATELET
BASO%: 1.3 % (ref 0.0–2.0)
Basophils Absolute: 0.1 10*3/uL (ref 0.0–0.1)
EOS%: 3.2 % (ref 0.0–7.0)
Eosinophils Absolute: 0.2 10*3/uL (ref 0.0–0.5)
HCT: 40.9 % (ref 34.8–46.6)
HGB: 13.7 g/dL (ref 11.6–15.9)
LYMPH%: 24.2 % (ref 14.0–49.7)
MCH: 32.1 pg (ref 25.1–34.0)
MCHC: 33.6 g/dL (ref 31.5–36.0)
MCV: 95.6 fL (ref 79.5–101.0)
MONO#: 0.6 10*3/uL (ref 0.1–0.9)
MONO%: 8.6 % (ref 0.0–14.0)
NEUT%: 62.7 % (ref 38.4–76.8)
NEUTROS ABS: 4.5 10*3/uL (ref 1.5–6.5)
PLATELETS: 244 10*3/uL (ref 145–400)
RBC: 4.27 10*6/uL (ref 3.70–5.45)
RDW: 13.8 % (ref 11.2–14.5)
WBC: 7.2 10*3/uL (ref 3.9–10.3)
lymph#: 1.8 10*3/uL (ref 0.9–3.3)

## 2016-09-16 LAB — COMPREHENSIVE METABOLIC PANEL
ALT: 11 U/L (ref 0–55)
ANION GAP: 9 meq/L (ref 3–11)
AST: 16 U/L (ref 5–34)
Albumin: 3.8 g/dL (ref 3.5–5.0)
Alkaline Phosphatase: 108 U/L (ref 40–150)
BILIRUBIN TOTAL: 0.38 mg/dL (ref 0.20–1.20)
BUN: 14.4 mg/dL (ref 7.0–26.0)
CHLORIDE: 107 meq/L (ref 98–109)
CO2: 25 meq/L (ref 22–29)
CREATININE: 0.7 mg/dL (ref 0.6–1.1)
Calcium: 9.5 mg/dL (ref 8.4–10.4)
EGFR: 82 mL/min/{1.73_m2} — ABNORMAL LOW (ref >90)
Glucose: 96 mg/dl (ref 70–140)
Potassium: 4.3 mEq/L (ref 3.5–5.1)
Sodium: 141 mEq/L (ref 136–145)
TOTAL PROTEIN: 6.3 g/dL — AB (ref 6.4–8.3)

## 2016-09-16 MED ORDER — DENOSUMAB 60 MG/ML ~~LOC~~ SOLN
60.0000 mg | Freq: Once | SUBCUTANEOUS | Status: AC
  Administered 2016-09-16: 60 mg via SUBCUTANEOUS
  Filled 2016-09-16: qty 1

## 2016-09-16 NOTE — Patient Instructions (Signed)
Denosumab injection What is this medicine? DENOSUMAB (den oh sue mab) slows bone breakdown. Prolia is used to treat osteoporosis in women after menopause and in men. Xgeva is used to prevent bone fractures and other bone problems caused by cancer bone metastases. Xgeva is also used to treat giant cell tumor of the bone. COMMON BRAND NAME(S): Prolia, XGEVA What should I tell my health care provider before I take this medicine? They need to know if you have any of these conditions: -dental disease -eczema -infection or history of infections -kidney disease or on dialysis -low blood calcium or vitamin D -malabsorption syndrome -scheduled to have surgery or tooth extraction -taking medicine that contains denosumab -thyroid or parathyroid disease -an unusual reaction to denosumab, other medicines, foods, dyes, or preservatives -pregnant or trying to get pregnant -breast-feeding How should I use this medicine? This medicine is for injection under the skin. It is given by a health care professional in a hospital or clinic setting. If you are getting Prolia, a special MedGuide will be given to you by the pharmacist with each prescription and refill. Be sure to read this information carefully each time. For Prolia, talk to your pediatrician regarding the use of this medicine in children. Special care may be needed. For Xgeva, talk to your pediatrician regarding the use of this medicine in children. While this drug may be prescribed for children as young as 13 years for selected conditions, precautions do apply. What if I miss a dose? It is important not to miss your dose. Call your doctor or health care professional if you are unable to keep an appointment. What may interact with this medicine? Do not take this medicine with any of the following medications: -other medicines containing denosumab This medicine may also interact with the following medications: -medicines that suppress the immune  system -medicines that treat cancer -steroid medicines like prednisone or cortisone What should I watch for while using this medicine? Visit your doctor or health care professional for regular checks on your progress. Your doctor or health care professional may order blood tests and other tests to see how you are doing. Call your doctor or health care professional if you get a cold or other infection while receiving this medicine. Do not treat yourself. This medicine may decrease your body's ability to fight infection. You should make sure you get enough calcium and vitamin D while you are taking this medicine, unless your doctor tells you not to. Discuss the foods you eat and the vitamins you take with your health care professional. See your dentist regularly. Brush and floss your teeth as directed. Before you have any dental work done, tell your dentist you are receiving this medicine. Do not become pregnant while taking this medicine or for 5 months after stopping it. Women should inform their doctor if they wish to become pregnant or think they might be pregnant. There is a potential for serious side effects to an unborn child. Talk to your health care professional or pharmacist for more information. What side effects may I notice from receiving this medicine? Side effects that you should report to your doctor or health care professional as soon as possible: -allergic reactions like skin rash, itching or hives, swelling of the face, lips, or tongue -breathing problems -chest pain -fast, irregular heartbeat -feeling faint or lightheaded, falls -fever, chills, or any other sign of infection -muscle spasms, tightening, or twitches -numbness or tingling -skin blisters or bumps, or is dry, peels, or red -slow   healing or unexplained pain in the mouth or jaw -unusual bleeding or bruising Side effects that usually do not require medical attention (report to your doctor or health care professional  if they continue or are bothersome): -muscle pain -stomach upset, gas Where should I keep my medicine? This medicine is only given in a clinic, doctor's office, or other health care setting and will not be stored at home.  2017 Elsevier/Gold Standard (2015-08-20 10:06:55)  

## 2016-10-03 DIAGNOSIS — H25813 Combined forms of age-related cataract, bilateral: Secondary | ICD-10-CM | POA: Diagnosis not present

## 2016-10-03 DIAGNOSIS — H5203 Hypermetropia, bilateral: Secondary | ICD-10-CM | POA: Diagnosis not present

## 2016-10-13 DIAGNOSIS — Z6831 Body mass index (BMI) 31.0-31.9, adult: Secondary | ICD-10-CM | POA: Diagnosis not present

## 2016-10-13 DIAGNOSIS — C50411 Malignant neoplasm of upper-outer quadrant of right female breast: Secondary | ICD-10-CM | POA: Diagnosis not present

## 2016-10-31 ENCOUNTER — Other Ambulatory Visit (HOSPITAL_BASED_OUTPATIENT_CLINIC_OR_DEPARTMENT_OTHER): Payer: Medicare Other

## 2016-10-31 DIAGNOSIS — C50411 Malignant neoplasm of upper-outer quadrant of right female breast: Secondary | ICD-10-CM | POA: Diagnosis not present

## 2016-10-31 DIAGNOSIS — Z170 Estrogen receptor positive status [ER+]: Principal | ICD-10-CM

## 2016-10-31 LAB — CBC WITH DIFFERENTIAL/PLATELET
BASO%: 1.2 % (ref 0.0–2.0)
BASOS ABS: 0.1 10*3/uL (ref 0.0–0.1)
EOS%: 5.5 % (ref 0.0–7.0)
Eosinophils Absolute: 0.3 10*3/uL (ref 0.0–0.5)
HCT: 42.9 % (ref 34.8–46.6)
HGB: 14.1 g/dL (ref 11.6–15.9)
LYMPH%: 32.9 % (ref 14.0–49.7)
MCH: 31.2 pg (ref 25.1–34.0)
MCHC: 33 g/dL (ref 31.5–36.0)
MCV: 94.7 fL (ref 79.5–101.0)
MONO#: 0.6 10*3/uL (ref 0.1–0.9)
MONO%: 9.7 % (ref 0.0–14.0)
NEUT#: 3 10*3/uL (ref 1.5–6.5)
NEUT%: 50.7 % (ref 38.4–76.8)
PLATELETS: 250 10*3/uL (ref 145–400)
RBC: 4.53 10*6/uL (ref 3.70–5.45)
RDW: 13.6 % (ref 11.2–14.5)
WBC: 5.9 10*3/uL (ref 3.9–10.3)
lymph#: 1.9 10*3/uL (ref 0.9–3.3)

## 2016-10-31 LAB — COMPREHENSIVE METABOLIC PANEL
ALBUMIN: 3.8 g/dL (ref 3.5–5.0)
ALK PHOS: 85 U/L (ref 40–150)
ALT: 13 U/L (ref 0–55)
ANION GAP: 9 meq/L (ref 3–11)
AST: 15 U/L (ref 5–34)
BILIRUBIN TOTAL: 0.52 mg/dL (ref 0.20–1.20)
BUN: 15.9 mg/dL (ref 7.0–26.0)
CO2: 23 mEq/L (ref 22–29)
Calcium: 9 mg/dL (ref 8.4–10.4)
Chloride: 110 mEq/L — ABNORMAL HIGH (ref 98–109)
Creatinine: 0.7 mg/dL (ref 0.6–1.1)
EGFR: 85 mL/min/{1.73_m2} — AB (ref >90)
GLUCOSE: 102 mg/dL (ref 70–140)
Potassium: 3.9 mEq/L (ref 3.5–5.1)
SODIUM: 142 meq/L (ref 136–145)
TOTAL PROTEIN: 6.3 g/dL — AB (ref 6.4–8.3)

## 2016-11-03 ENCOUNTER — Telehealth: Payer: Self-pay | Admitting: Oncology

## 2016-11-03 ENCOUNTER — Ambulatory Visit (HOSPITAL_BASED_OUTPATIENT_CLINIC_OR_DEPARTMENT_OTHER): Payer: Medicare Other | Admitting: Oncology

## 2016-11-03 VITALS — BP 146/64 | HR 74 | Temp 97.5°F | Resp 18 | Ht 64.0 in | Wt 181.4 lb

## 2016-11-03 DIAGNOSIS — M810 Age-related osteoporosis without current pathological fracture: Secondary | ICD-10-CM | POA: Diagnosis not present

## 2016-11-03 DIAGNOSIS — Z853 Personal history of malignant neoplasm of breast: Secondary | ICD-10-CM | POA: Diagnosis not present

## 2016-11-03 DIAGNOSIS — C50411 Malignant neoplasm of upper-outer quadrant of right female breast: Secondary | ICD-10-CM

## 2016-11-03 DIAGNOSIS — Z170 Estrogen receptor positive status [ER+]: Principal | ICD-10-CM

## 2016-11-03 NOTE — Progress Notes (Signed)
[facility]  [phone] Fax:([phone]) [phone]  OFFICE PROGRESS NOTE   ID: Jamie Cole   DOB: 08/25/1940  MR#: [id number]  BDZ#:[id number]   PCP: [name], MD GYN:  SU: Fanny Skates, M.D. RAD ONC: Rexene Edison, M.D.  CHIEF COMPLAINT: Estrogen receptor positive her early stage breast cancer   CURRENT TREATMENT: denosumab/Prolia   BREAST CANCER ISTORY From Dr. Julien Girt new patient evaluation note dated 09/07/2011:  "This is a delightful 76 year old woman here today with her family for discussion of her recent diagnosis of breast cancer. She does undergo annual screening mammography. She did not take any abnormalities in her breast. A screening mammogram 08/15/2011 showed a possible mass in the right breast additional views were recommended. Last mammogram right breast ultrasound performed 08/31/2011 showed a suspicious 5 x 6 x 6 mm mass in the upper outer right breast with calcifications and a biopsy was recommended. Biopsy. performed 08/31/2011 demonstrated a invasive ductal cancer likely grade 2 HER-2 was negative the ratio 1.56 the tumor was strongly ER and PR positive. An MRI scans performed to 09/06/11 showed a mass in the right breast measuring 9 x 9 x 8 mm. No other lesions were seen. There is no other evidence of adenopathy. A 1 cm lesion in the left lobe of the liver was seen likely a cyst."    Her subsequent history is as detailed below.    INTERVAL HISTORY: Jamie Cole returns today for follow-up of her estrogen receptor positive breast cancer. She completes 5 years of anastrozole this month. She has generally tolerated this well.  Hot flashes and vaginal dryness are not a major issue. She never developed the arthralgias or myalgias that many patients can experience on this medication. She obtains it at a good price.  We repeated a bone density scan in January. This showed a significant drop in her T score. It had been -1.6 in 2014, -1.9 in 2016,  and now -3.1 in 2018. She tells me there has been no significant use of steroids and no immobility. Since most of the bone loss in aromatase inhibitors occurs in the first year, this is an unusual pattern. Nevertheless we discussed that and she received her first dose of denosumab/Prolia in February. She tolerated that with no side effects.  Unfortunately since her last visit here Jamie Cole's husband died, heart disease, in 07-02-16.   REVIEW OF SYSTEMS: Jamie Cole is actively morning her husband, but otherwise a detailed review of systems today was benign   PAST MEDICAL HISTORY: Past Medical History:  Diagnosis Date  . Basal cell carcinoma of neck       . Breast cancer ([place]) 08/31/11   DCIS; right breast, ER/PR +  . Breast cancer ([place]) 2/13   9 MM R breast- E+/P+ HVR 2-  . Endometrial hyperplasia, simple   . Endometrial polyp   . Fracture    right arm  . H/O TB skin testing 1970's   + INH rx  . Hx of radiation therapy 10/18/11 to 11/18/11   right breast  . Hx of radiation therapy 1957   4 tx to face for acne  . Hypothyroidism   . Metacarpal bone fracture 08/30/2012   Right 5th finger fracture    PAST SURGICAL HISTORY: Past Surgical History:  Procedure Laterality Date  . BREAST LUMPECTOMY  2/13   sentinel node  . endometrial polyp    . HYSTEROSCOPY  12/00   polyps  . MASTECTOMY, PARTIAL  09/14/11  right breast  . TONSILLECTOMY     adenoids    FAMILY HISTORY Family History  Problem Relation Age of Onset  . Skin cancer Father 69  . Breast cancer Paternal Aunt     x 2  . Stomach cancer Paternal Grandmother 59  . Breast cancer Cousin     ages 31-60 at dx x 4 paternal  . Prostate cancer Cousin     ????  . Brain cancer Maternal Uncle     brain  . Cancer Cousin     tonsillar  . Colon cancer Neg Hx   . Stroke Father   . Stroke Brother     GYNECOLOGIC HISTORY: G1P1, menarche 40, menopause 76, HRT x 2-3y w/o unusual complications  SOCIAL HISTORY: The patient and her  husband Jamie Cole have been married since 39.  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 Patient's son is an Forensic psychologist in [facility] and is a Occupational psychologist. The patient has no grandchildren but 4 grandcats. In her spare time the patient enjoys traveling, visiting with friends and family and crocheting.   ADVANCED DIRECTIVES: in place  HEALTH MAINTENANCE: Social History  Substance Use Topics  . Smoking status: Never Smoker  . Smokeless tobacco: Never Used  . Alcohol use No    Colonoscopy: 09/2011 PAP: 11/2011 Bone density: showed osteopenia with T -1.9 Lipid panel:  Not on file  Allergies  Allergen Reactions  . Other     PLEASE DO BLOOD PRESSURE IN LEFT ARM ONLY    Current Outpatient Prescriptions  Medication Sig Dispense Refill  . Ascorbic Acid (VITAMIN C) 1000 MG tablet Take 1,000 mg by mouth daily.    . calcium-vitamin D (SM CALCIUM 500/VITAMIN D3) 500-400 MG-UNIT per tablet Take 630 mg by mouth.    . levothyroxine (SYNTHROID) 112 MCG tablet Take 112 mcg by mouth daily before breakfast. Sunday and Wednesday 1/2 tab, 1 tablet all other days.    . magnesium oxide (MAG-OX) 400 (241.3 MG) MG tablet Take 1 tablet (400 mg total) by mouth daily.    Marland Kitchen triamcinolone cream (KENALOG) 0.1 % APP EXT AA BID FOR 14 DAYS  0  . Vitamin D, Ergocalciferol, (DRISDOL) 50000 units CAPS capsule TAKE 1 CAPSULE BY MOUTH EVERY 7 DAYS 12 capsule 4   No current facility-administered medications for this visit.     OBJECTIVE: Older white woman In no acute distress Vitals:   11/03/16 1213  BP: (!) 146/64  Pulse: 74  Resp: 18  Temp: 97.5 F (36.4 C)     Body mass index is 31.14 kg/m.      ECOG FS: 1 - Symptomatic but completely ambulatory  Sclerae unicteric, pupils round and equal Oropharynx clear and moist No cervical or supraclavicular adenopathy Lungs no rales or rhonchi Heart regular rate and rhythm Abd soft, nontender, positive bowel sounds MSK kyphosis  but no focal spinal tenderness, no upper extremity lymphedema Neuro: nonfocal, well oriented, appropriate affect Breasts: The right breast has undergone lumpectomy followed by radiation with no evidence of local recurrence. The left breast is unremarkable. Both axillae are benign.  LAB RESULTS: Lab Results  Component Value Date   WBC 5.9 10/31/2016   NEUTROABS 3.0 10/31/2016   HGB 14.1 10/31/2016   HCT 42.9 10/31/2016   MCV 94.7 10/31/2016   PLT 250 10/31/2016      Chemistry      Component Value Date/Time   NA 142 10/31/2016 0823   K 3.9  10/31/2016 0823   CL 106 01/01/2013 0845   CO2 23 10/31/2016 0823   BUN 15.9 10/31/2016 0823   CREATININE 0.7 10/31/2016 0823      Component Value Date/Time   CALCIUM 9.0 10/31/2016 0823   ALKPHOS 85 10/31/2016 0823   AST 15 10/31/2016 0823   ALT 13 10/31/2016 0823   BILITOT 0.52 10/31/2016 0823      Lab Results  Component Value Date   LABCA2 18 06/25/2012    Urinalysis    Component Value Date/Time   COLORURINE YELLOW 09/14/2011 1013   APPEARANCEUR CLOUDY (A) 09/14/2011 1013   LABSPEC >1.030 (H) 09/14/2011 1013   PHURINE 5.0 09/14/2011 1013   GLUCOSEU NEGATIVE 09/14/2011 1013   HGBUR NEGATIVE 09/14/2011 1013   BILIRUBINUR SMALL (A) 09/14/2011 1013   KETONESUR 15 (A) 09/14/2011 1013   PROTEINUR NEGATIVE 09/14/2011 1013   UROBILINOGEN 0.2 09/14/2011 1013   NITRITE NEGATIVE 09/14/2011 1013   LEUKOCYTESUR NEGATIVE 09/14/2011 1013    STUDIES: Recent mammography and bone density scans from the last 5 years reviewed in detail  .ASSESSMENT: 76 y.o. BRCA negative [place], [place] woman:  1.  Status post right breast core biopsy 08/31/2011 showing invasive ductal carcinoma, ER 100%, PR 100%, Ki-67 19%, HER-2/neu by C[facility] no amplification with a ratio 1.56.  2.  Status post right breast lumpectomy with right axillary sentinel node biopsy 09/14/2011 for a pT1b, pN0, stage IA invasive ductal carcinoma, grade 1, again  HER-2/neu by CISH no amplification with ratio of 1.32  3. Status post radiation therapy from 10/18/2011 through 11/18/2011.  4. Started antiestrogen therapy with Arimidex in 10/2011.  5.  Osteopenia:/ osteoporosis  (a) baseline bone scan January 2014 shows a T score of -1.6  (b)  Bone scan January 2016 shows a T score of -1.9  (c) bone density 08/24/2016 shows a T score of -3.1.   (d) denosumab/Prolia started 09/16/2016, to be repeated every 6 months    PLAN: Eulla is just over 5 years out from definitive surgery for her breast cancer with no evidence of disease recurrence. This is very favorable.  She completed 5 years of anastrozole. In her case and very comfortable stopping at this point. The additional benefit she might gain from another 2 years of anastrozole would be minimal, in the 1% range for disseminated disease, versus the fact that she has worsening bone density issues, now with osteoporosis. She is comfortable stopping the medication  I was ready to release Magdeline from follow-up but she will be receiving her denosumab here, which is her preference. Accordingly I will continue to see her on a once a year basis until she completes the 2 year course of denosumab planned.  Her next dose will be in August. She will then see me again February 2019 with her third dose. We will repeat a bone density in January 2020. At that time we will consider discharging her from follow-up  She has a good understanding of the overall plan she agrees with it. She will call with any problems that may develop before her next visit. 11/03/2016, 5:06 PM

## 2016-11-03 NOTE — Telephone Encounter (Signed)
Gave patient avs report and appointments for August 2018 and February 2019. Patient also given mammo for January 2019 at the [facility].

## 2016-11-07 ENCOUNTER — Telehealth: Payer: Self-pay | Admitting: Adult Health

## 2016-11-07 NOTE — Telephone Encounter (Signed)
Received call from patient with questions about her appointment with Dr. Jana Hakim on my voice mail.  She wants to know if she is supposed to stop the Arimidex that she is taking.    Per his note from last week: she should stop the Arimidex.     Please contact patient and clarify.  Thanks,  Wilber Bihari, NP

## 2016-11-07 NOTE — Telephone Encounter (Signed)
As noted below by Iva Lento, NP, Dr. Jana Hakim wants her to stop Arimidex. This message was left on her voice mail. Instructed patient to call [facility] if she had any further questions or concerns.

## 2016-11-17 DIAGNOSIS — H2512 Age-related nuclear cataract, left eye: Secondary | ICD-10-CM | POA: Diagnosis not present

## 2016-11-17 DIAGNOSIS — H25812 Combined forms of age-related cataract, left eye: Secondary | ICD-10-CM | POA: Diagnosis not present

## 2016-11-23 DIAGNOSIS — R7302 Impaired glucose tolerance (oral): Secondary | ICD-10-CM | POA: Diagnosis not present

## 2016-11-23 DIAGNOSIS — E038 Other specified hypothyroidism: Secondary | ICD-10-CM | POA: Diagnosis not present

## 2016-11-23 DIAGNOSIS — M859 Disorder of bone density and structure, unspecified: Secondary | ICD-10-CM | POA: Diagnosis not present

## 2016-11-23 DIAGNOSIS — E7800 Pure hypercholesterolemia, unspecified: Secondary | ICD-10-CM | POA: Diagnosis not present

## 2016-11-30 DIAGNOSIS — D0590 Unspecified type of carcinoma in situ of unspecified breast: Secondary | ICD-10-CM | POA: Diagnosis not present

## 2016-11-30 DIAGNOSIS — Z1389 Encounter for screening for other disorder: Secondary | ICD-10-CM | POA: Diagnosis not present

## 2016-11-30 DIAGNOSIS — E7800 Pure hypercholesterolemia, unspecified: Secondary | ICD-10-CM | POA: Diagnosis not present

## 2016-11-30 DIAGNOSIS — M160 Bilateral primary osteoarthritis of hip: Secondary | ICD-10-CM | POA: Diagnosis not present

## 2016-11-30 DIAGNOSIS — M818 Other osteoporosis without current pathological fracture: Secondary | ICD-10-CM | POA: Diagnosis not present

## 2016-11-30 DIAGNOSIS — K635 Polyp of colon: Secondary | ICD-10-CM | POA: Diagnosis not present

## 2016-11-30 DIAGNOSIS — Z6830 Body mass index (BMI) 30.0-30.9, adult: Secondary | ICD-10-CM | POA: Diagnosis not present

## 2016-11-30 DIAGNOSIS — E559 Vitamin D deficiency, unspecified: Secondary | ICD-10-CM | POA: Diagnosis not present

## 2016-11-30 DIAGNOSIS — R7302 Impaired glucose tolerance (oral): Secondary | ICD-10-CM | POA: Diagnosis not present

## 2016-11-30 DIAGNOSIS — Z0000 Encounter for general adult medical examination without abnormal findings: Secondary | ICD-10-CM | POA: Diagnosis not present

## 2016-11-30 DIAGNOSIS — E038 Other specified hypothyroidism: Secondary | ICD-10-CM | POA: Diagnosis not present

## 2016-11-30 DIAGNOSIS — E668 Other obesity: Secondary | ICD-10-CM | POA: Diagnosis not present

## 2016-12-08 DIAGNOSIS — H25811 Combined forms of age-related cataract, right eye: Secondary | ICD-10-CM | POA: Diagnosis not present

## 2016-12-08 DIAGNOSIS — H2511 Age-related nuclear cataract, right eye: Secondary | ICD-10-CM | POA: Diagnosis not present

## 2017-01-26 DIAGNOSIS — M1711 Unilateral primary osteoarthritis, right knee: Secondary | ICD-10-CM | POA: Diagnosis not present

## 2017-03-07 ENCOUNTER — Other Ambulatory Visit (HOSPITAL_BASED_OUTPATIENT_CLINIC_OR_DEPARTMENT_OTHER): Payer: Medicare Other

## 2017-03-07 ENCOUNTER — Ambulatory Visit (HOSPITAL_BASED_OUTPATIENT_CLINIC_OR_DEPARTMENT_OTHER): Payer: Medicare Other

## 2017-03-07 VITALS — BP 150/72 | HR 65 | Temp 97.5°F | Resp 18

## 2017-03-07 DIAGNOSIS — C50411 Malignant neoplasm of upper-outer quadrant of right female breast: Secondary | ICD-10-CM

## 2017-03-07 DIAGNOSIS — M810 Age-related osteoporosis without current pathological fracture: Secondary | ICD-10-CM | POA: Diagnosis present

## 2017-03-07 DIAGNOSIS — Z853 Personal history of malignant neoplasm of breast: Secondary | ICD-10-CM

## 2017-03-07 DIAGNOSIS — T386X5A Adverse effect of antigonadotrophins, antiestrogens, antiandrogens, not elsewhere classified, initial encounter: Principal | ICD-10-CM

## 2017-03-07 DIAGNOSIS — M818 Other osteoporosis without current pathological fracture: Secondary | ICD-10-CM

## 2017-03-07 DIAGNOSIS — Z170 Estrogen receptor positive status [ER+]: Principal | ICD-10-CM

## 2017-03-07 LAB — COMPREHENSIVE METABOLIC PANEL
ALK PHOS: 67 U/L (ref 40–150)
ALT: 12 U/L (ref 0–55)
ANION GAP: 8 meq/L (ref 3–11)
AST: 15 U/L (ref 5–34)
Albumin: 3.8 g/dL (ref 3.5–5.0)
BUN: 15.8 mg/dL (ref 7.0–26.0)
CALCIUM: 8.9 mg/dL (ref 8.4–10.4)
CO2: 23 mEq/L (ref 22–29)
Chloride: 109 mEq/L (ref 98–109)
Creatinine: 0.6 mg/dL (ref 0.6–1.1)
EGFR: 85 mL/min/{1.73_m2} — AB (ref >90)
Glucose: 102 mg/dl (ref 70–140)
POTASSIUM: 4 meq/L (ref 3.5–5.1)
SODIUM: 140 meq/L (ref 136–145)
Total Bilirubin: 0.32 mg/dL (ref 0.20–1.20)
Total Protein: 6.2 g/dL — ABNORMAL LOW (ref 6.4–8.3)

## 2017-03-07 LAB — CBC WITH DIFFERENTIAL/PLATELET
BASO%: 1.3 % (ref 0.0–2.0)
Basophils Absolute: 0.1 10*3/uL (ref 0.0–0.1)
EOS%: 6.3 % (ref 0.0–7.0)
Eosinophils Absolute: 0.4 10*3/uL (ref 0.0–0.5)
HCT: 41.1 % (ref 34.8–46.6)
HEMOGLOBIN: 13.9 g/dL (ref 11.6–15.9)
LYMPH%: 30.3 % (ref 14.0–49.7)
MCH: 32 pg (ref 25.1–34.0)
MCHC: 33.7 g/dL (ref 31.5–36.0)
MCV: 94.8 fL (ref 79.5–101.0)
MONO#: 0.6 10*3/uL (ref 0.1–0.9)
MONO%: 8.9 % (ref 0.0–14.0)
NEUT%: 53.2 % (ref 38.4–76.8)
NEUTROS ABS: 3.6 10*3/uL (ref 1.5–6.5)
Platelets: 249 10*3/uL (ref 145–400)
RBC: 4.33 10*6/uL (ref 3.70–5.45)
RDW: 13.6 % (ref 11.2–14.5)
WBC: 6.8 10*3/uL (ref 3.9–10.3)
lymph#: 2.1 10*3/uL (ref 0.9–3.3)

## 2017-03-07 MED ORDER — DENOSUMAB 60 MG/ML ~~LOC~~ SOLN
60.0000 mg | Freq: Once | SUBCUTANEOUS | Status: AC
  Administered 2017-03-07: 60 mg via SUBCUTANEOUS
  Filled 2017-03-07: qty 1

## 2017-03-07 NOTE — Patient Instructions (Signed)
Denosumab injection What is this medicine? DENOSUMAB (den oh sue mab) slows bone breakdown. Prolia is used to treat osteoporosis in women after menopause and in men. Xgeva is used to prevent bone fractures and other bone problems caused by cancer bone metastases. Xgeva is also used to treat giant cell tumor of the bone. COMMON BRAND NAME(S): Prolia, XGEVA What should I tell my health care provider before I take this medicine? They need to know if you have any of these conditions: -dental disease -eczema -infection or history of infections -kidney disease or on dialysis -low blood calcium or vitamin D -malabsorption syndrome -scheduled to have surgery or tooth extraction -taking medicine that contains denosumab -thyroid or parathyroid disease -an unusual reaction to denosumab, other medicines, foods, dyes, or preservatives -pregnant or trying to get pregnant -breast-feeding How should I use this medicine? This medicine is for injection under the skin. It is given by a health care professional in a hospital or clinic setting. If you are getting Prolia, a special MedGuide will be given to you by the pharmacist with each prescription and refill. Be sure to read this information carefully each time. For Prolia, talk to your pediatrician regarding the use of this medicine in children. Special care may be needed. For Xgeva, talk to your pediatrician regarding the use of this medicine in children. While this drug may be prescribed for children as young as 13 years for selected conditions, precautions do apply. What if I miss a dose? It is important not to miss your dose. Call your doctor or health care professional if you are unable to keep an appointment. What may interact with this medicine? Do not take this medicine with any of the following medications: -other medicines containing denosumab This medicine may also interact with the following medications: -medicines that suppress the immune  system -medicines that treat cancer -steroid medicines like prednisone or cortisone What should I watch for while using this medicine? Visit your doctor or health care professional for regular checks on your progress. Your doctor or health care professional may order blood tests and other tests to see how you are doing. Call your doctor or health care professional if you get a cold or other infection while receiving this medicine. Do not treat yourself. This medicine may decrease your body's ability to fight infection. You should make sure you get enough calcium and vitamin D while you are taking this medicine, unless your doctor tells you not to. Discuss the foods you eat and the vitamins you take with your health care professional. See your dentist regularly. Brush and floss your teeth as directed. Before you have any dental work done, tell your dentist you are receiving this medicine. Do not become pregnant while taking this medicine or for 5 months after stopping it. Women should inform their doctor if they wish to become pregnant or think they might be pregnant. There is a potential for serious side effects to an unborn child. Talk to your health care professional or pharmacist for more information. What side effects may I notice from receiving this medicine? Side effects that you should report to your doctor or health care professional as soon as possible: -allergic reactions like skin rash, itching or hives, swelling of the face, lips, or tongue -breathing problems -chest pain -fast, irregular heartbeat -feeling faint or lightheaded, falls -fever, chills, or any other sign of infection -muscle spasms, tightening, or twitches -numbness or tingling -skin blisters or bumps, or is dry, peels, or red -slow   healing or unexplained pain in the mouth or jaw -unusual bleeding or bruising Side effects that usually do not require medical attention (report to your doctor or health care professional  if they continue or are bothersome): -muscle pain -stomach upset, gas Where should I keep my medicine? This medicine is only given in a clinic, doctor's office, or other health care setting and will not be stored at home.  2017 Elsevier/Gold Standard (2015-08-20 10:06:55)  

## 2017-03-10 DIAGNOSIS — M1711 Unilateral primary osteoarthritis, right knee: Secondary | ICD-10-CM | POA: Diagnosis not present

## 2017-05-30 DIAGNOSIS — E669 Obesity, unspecified: Secondary | ICD-10-CM | POA: Diagnosis not present

## 2017-05-30 DIAGNOSIS — R7302 Impaired glucose tolerance (oral): Secondary | ICD-10-CM | POA: Diagnosis not present

## 2017-05-30 DIAGNOSIS — M1711 Unilateral primary osteoarthritis, right knee: Secondary | ICD-10-CM | POA: Diagnosis not present

## 2017-05-30 DIAGNOSIS — E559 Vitamin D deficiency, unspecified: Secondary | ICD-10-CM | POA: Diagnosis not present

## 2017-05-30 DIAGNOSIS — Z6832 Body mass index (BMI) 32.0-32.9, adult: Secondary | ICD-10-CM | POA: Diagnosis not present

## 2017-05-30 DIAGNOSIS — E038 Other specified hypothyroidism: Secondary | ICD-10-CM | POA: Diagnosis not present

## 2017-05-30 DIAGNOSIS — Z23 Encounter for immunization: Secondary | ICD-10-CM | POA: Diagnosis not present

## 2017-05-30 DIAGNOSIS — M818 Other osteoporosis without current pathological fracture: Secondary | ICD-10-CM | POA: Diagnosis not present

## 2017-05-30 DIAGNOSIS — D0590 Unspecified type of carcinoma in situ of unspecified breast: Secondary | ICD-10-CM | POA: Diagnosis not present

## 2017-05-30 DIAGNOSIS — E7800 Pure hypercholesterolemia, unspecified: Secondary | ICD-10-CM | POA: Diagnosis not present

## 2017-07-18 DIAGNOSIS — L821 Other seborrheic keratosis: Secondary | ICD-10-CM | POA: Diagnosis not present

## 2017-07-18 DIAGNOSIS — L814 Other melanin hyperpigmentation: Secondary | ICD-10-CM | POA: Diagnosis not present

## 2017-07-18 DIAGNOSIS — Z85828 Personal history of other malignant neoplasm of skin: Secondary | ICD-10-CM | POA: Diagnosis not present

## 2017-07-18 DIAGNOSIS — L309 Dermatitis, unspecified: Secondary | ICD-10-CM | POA: Diagnosis not present

## 2017-07-18 DIAGNOSIS — D1801 Hemangioma of skin and subcutaneous tissue: Secondary | ICD-10-CM | POA: Diagnosis not present

## 2017-07-18 DIAGNOSIS — D225 Melanocytic nevi of trunk: Secondary | ICD-10-CM | POA: Diagnosis not present

## 2017-07-18 DIAGNOSIS — L570 Actinic keratosis: Secondary | ICD-10-CM | POA: Diagnosis not present

## 2017-07-18 DIAGNOSIS — Z86018 Personal history of other benign neoplasm: Secondary | ICD-10-CM | POA: Diagnosis not present

## 2017-07-18 DIAGNOSIS — Z23 Encounter for immunization: Secondary | ICD-10-CM | POA: Diagnosis not present

## 2017-08-25 ENCOUNTER — Ambulatory Visit
Admission: RE | Admit: 2017-08-25 | Discharge: 2017-08-25 | Disposition: A | Discharge status: Home / Self Care | Payer: Medicare Other | Source: Ambulatory Visit | Attending: Oncology | Admitting: Oncology

## 2017-08-25 DIAGNOSIS — R928 Other abnormal and inconclusive findings on diagnostic imaging of breast: Secondary | ICD-10-CM | POA: Diagnosis not present

## 2017-08-25 DIAGNOSIS — C50411 Malignant neoplasm of upper-outer quadrant of right female breast: Secondary | ICD-10-CM

## 2017-08-25 DIAGNOSIS — Z170 Estrogen receptor positive status [ER+]: Principal | ICD-10-CM

## 2017-08-25 HISTORY — DX: Personal history of irradiation: Z92.3

## 2017-09-04 ENCOUNTER — Other Ambulatory Visit: Payer: Self-pay | Admitting: Oncology

## 2017-09-05 ENCOUNTER — Encounter: Payer: Self-pay | Admitting: Oncology

## 2017-09-05 ENCOUNTER — Inpatient Hospital Stay: Payer: Medicare Other

## 2017-09-05 ENCOUNTER — Telehealth: Payer: Self-pay | Admitting: Oncology

## 2017-09-05 ENCOUNTER — Inpatient Hospital Stay: Payer: Medicare Other | Attending: Oncology | Admitting: Oncology

## 2017-09-05 VITALS — BP 135/77 | HR 72 | Temp 97.7°F | Resp 18 | Ht 64.0 in | Wt 194.7 lb

## 2017-09-05 DIAGNOSIS — Z9011 Acquired absence of right breast and nipple: Secondary | ICD-10-CM | POA: Insufficient documentation

## 2017-09-05 DIAGNOSIS — Z800 Family history of malignant neoplasm of digestive organs: Secondary | ICD-10-CM | POA: Diagnosis not present

## 2017-09-05 DIAGNOSIS — Z923 Personal history of irradiation: Secondary | ICD-10-CM | POA: Diagnosis not present

## 2017-09-05 DIAGNOSIS — Z170 Estrogen receptor positive status [ER+]: Principal | ICD-10-CM

## 2017-09-05 DIAGNOSIS — Z79899 Other long term (current) drug therapy: Secondary | ICD-10-CM | POA: Diagnosis not present

## 2017-09-05 DIAGNOSIS — E039 Hypothyroidism, unspecified: Secondary | ICD-10-CM | POA: Insufficient documentation

## 2017-09-05 DIAGNOSIS — Z8042 Family history of malignant neoplasm of prostate: Secondary | ICD-10-CM | POA: Diagnosis not present

## 2017-09-05 DIAGNOSIS — Z85828 Personal history of other malignant neoplasm of skin: Secondary | ICD-10-CM | POA: Insufficient documentation

## 2017-09-05 DIAGNOSIS — T386X5A Adverse effect of antigonadotrophins, antiestrogens, antiandrogens, not elsewhere classified, initial encounter: Principal | ICD-10-CM

## 2017-09-05 DIAGNOSIS — M818 Other osteoporosis without current pathological fracture: Secondary | ICD-10-CM

## 2017-09-05 DIAGNOSIS — Z8781 Personal history of (healed) traumatic fracture: Secondary | ICD-10-CM | POA: Diagnosis not present

## 2017-09-05 DIAGNOSIS — Z803 Family history of malignant neoplasm of breast: Secondary | ICD-10-CM | POA: Insufficient documentation

## 2017-09-05 DIAGNOSIS — C50411 Malignant neoplasm of upper-outer quadrant of right female breast: Secondary | ICD-10-CM

## 2017-09-05 DIAGNOSIS — Z809 Family history of malignant neoplasm, unspecified: Secondary | ICD-10-CM | POA: Insufficient documentation

## 2017-09-05 DIAGNOSIS — Z808 Family history of malignant neoplasm of other organs or systems: Secondary | ICD-10-CM | POA: Diagnosis not present

## 2017-09-05 DIAGNOSIS — M810 Age-related osteoporosis without current pathological fracture: Secondary | ICD-10-CM | POA: Insufficient documentation

## 2017-09-05 DIAGNOSIS — Z853 Personal history of malignant neoplasm of breast: Secondary | ICD-10-CM | POA: Diagnosis not present

## 2017-09-05 LAB — COMPREHENSIVE METABOLIC PANEL
ALT: 9 U/L (ref 0–55)
AST: 16 U/L (ref 5–34)
Albumin: 3.7 g/dL (ref 3.5–5.0)
Alkaline Phosphatase: 68 U/L (ref 40–150)
Anion gap: 9 (ref 3–11)
BILIRUBIN TOTAL: 0.4 mg/dL (ref 0.2–1.2)
BUN: 16 mg/dL (ref 7–26)
CO2: 26 mmol/L (ref 22–29)
CREATININE: 0.71 mg/dL (ref 0.60–1.10)
Calcium: 9.4 mg/dL (ref 8.4–10.4)
Chloride: 105 mmol/L (ref 98–109)
GFR calc Af Amer: >60 mL/min (ref >60)
Glucose, Bld: 89 mg/dL (ref 70–140)
Potassium: 4.4 mmol/L (ref 3.5–5.1)
Sodium: 140 mmol/L (ref 136–145)
Total Protein: 6.4 g/dL (ref 6.4–8.3)

## 2017-09-05 LAB — CBC WITH DIFFERENTIAL/PLATELET
Basophils Absolute: 0.1 10*3/uL (ref 0.0–0.1)
Basophils Relative: 1 %
EOS ABS: 0.2 10*3/uL (ref 0.0–0.5)
EOS PCT: 3 %
HCT: 42.6 % (ref 34.8–46.6)
Hemoglobin: 14.1 g/dL (ref 11.6–15.9)
LYMPHS ABS: 1.8 10*3/uL (ref 0.9–3.3)
Lymphocytes Relative: 24 %
MCH: 31.1 pg (ref 25.1–34.0)
MCHC: 33.2 g/dL (ref 31.5–36.0)
MCV: 93.8 fL (ref 79.5–101.0)
MONO ABS: 0.8 10*3/uL (ref 0.1–0.9)
Monocytes Relative: 10 %
Neutro Abs: 4.7 10*3/uL (ref 1.5–6.5)
Neutrophils Relative %: 62 %
PLATELETS: 241 10*3/uL (ref 145–400)
RBC: 4.54 MIL/uL (ref 3.70–5.45)
RDW: 13.6 % (ref 11.2–14.5)
WBC: 7.6 10*3/uL (ref 3.9–10.3)

## 2017-09-05 MED ORDER — DENOSUMAB 60 MG/ML ~~LOC~~ SOLN
60.0000 mg | Freq: Once | SUBCUTANEOUS | Status: AC
  Administered 2017-09-05: 60 mg via SUBCUTANEOUS

## 2017-09-05 NOTE — Telephone Encounter (Signed)
Gave patient avs and calendar with appts per 2/5 los °

## 2017-09-05 NOTE — Progress Notes (Signed)
Jamie Cole  [phone] [phone]  OFFICE PROGRESS NOTE   ID: RANAY KETTER   DOB: Sep 30, 1940  MR#: [id number]  APO#:[id number]   PCP: [name], MD GYN:  SUFanny Skates, M.D. RAD ONC: Rexene Edison, M.D.  CHIEF COMPLAINT: Estrogen receptor positive early stage breast cancer   CURRENT TREATMENT: denosumab/Prolia   BREAST CANCER ISTORY From Dr. Julien Girt new patient evaluation note dated 09/07/2011:  "This is a delightful 78 year old woman here today with Jamie Cole family for discussion of Jamie Cole recent diagnosis of breast cancer. She does undergo annual screening mammography. She did not take any abnormalities in Jamie Cole breast. A screening mammogram 08/15/2011 showed a possible mass in the right breast additional views were recommended. Last mammogram right breast ultrasound performed 08/31/2011 showed a suspicious 5 x 6 x 6 mm mass in the upper outer right breast with calcifications and a biopsy was recommended. Biopsy. performed 08/31/2011 demonstrated a invasive ductal cancer likely grade 2 Jamie Cole-2 was negative the ratio 1.56 the tumor was strongly ER and PR positive. An MRI scans performed to 09/06/11 showed a mass in the right breast measuring 9 x 9 x 8 mm. No other lesions were seen. There is no other evidence of adenopathy. A 1 cm lesion in the left lobe of the liver was seen likely a cyst."    Jamie Cole subsequent history is as detailed below.    INTERVAL HISTORY: Jamie Cole returns today for follow-up of Jamie Cole estrogen receptor positive breast cancer.  She completed Jamie Cole 5 years of antiestrogen and from that point of view she is done.  However she does have significant osteopenia and is receiving Prolia through our office.  She reports that she is doing well overall. She notes that she is unsure about "graduating" at this time and that she may want to continue for a little while longer because of the Prolia shots  Recall she lost Jamie Cole husband last year.   She  has had a diagnostic mammography at [facility] on 08/25/2017 with results showing: Breast density category: B. No mammographic evidence of malignancy in either breast.     REVIEW OF SYSTEMS: Erva reports that for exercise, she has not been exercising much lately. She enjoys playing with Jamie Cole 79.62 year old granddaughter, which has been Jamie Cole means of exercise recently. She notes that she has been organizing Jamie Cole home recently. She has also been going out to eat with family and friends when they request. She denies unusual headaches, visual changes, nausea, vomiting, or dizziness. There has been no unusual cough, phlegm production, or pleurisy. This been no change in bowel or bladder habits. She denies unexplained fatigue or unexplained weight loss, bleeding, rash, or fever. A detailed review of systems was otherwise stable.    PAST MEDICAL HISTORY: Past Medical History:  Diagnosis Date  . Basal cell carcinoma of neck       . Breast cancer ([place]) 08/31/11   DCIS; right breast, ER/PR +  . Breast cancer ([place]) 2/13   9 MM R breast- E+/P+ HVR 2-  . Endometrial hyperplasia, simple   . Endometrial polyp   . Fracture    right arm  . H/O TB skin testing 1970's   + INH rx  . Hx of radiation therapy 10/18/11 to 11/18/11   right breast  . Hx of radiation therapy 1957   4 tx to face for acne  . Hypothyroidism   . Metacarpal bone fracture 08/30/2012   Right 5th finger  fracture  . Personal history of radiation therapy     PAST SURGICAL HISTORY: Past Surgical History:  Procedure Laterality Date  . BREAST LUMPECTOMY  2/13   sentinel node  . endometrial polyp    . HYSTEROSCOPY  12/00   polyps  . MASTECTOMY, PARTIAL  09/14/11   right breast  . TONSILLECTOMY     adenoids    FAMILY HISTORY Family History  Problem Relation Age of Onset  . Skin cancer Father 31  . Stroke Father   . Stomach cancer Paternal Grandmother 47  . Breast cancer Paternal Aunt        x 2  . Breast cancer Cousin         ages 57-60 at dx x 4 paternal  . Prostate cancer Cousin        ????  . Brain cancer Maternal Uncle        brain  . Cancer Cousin        tonsillar  . Stroke Brother   . Colon cancer Neg Hx     GYNECOLOGIC HISTORY: G1P1, menarche 27, menopause 73, HRT x 2-3y w/o unusual complications  SOCIAL HISTORY: The patient and Jamie Cole husband Clair Gulling have been married since 19.  She is a retired Pharmacist, hospital and Jamie Cole husband  a retired [employer] fast streamer.  He died in early 11/01/16 patient's son is an Forensic psychologist in [employer] and is a Occupational psychologist. The patient has a granddaughter and t 4 grandcats. In Jamie Cole spare time the patient enjoys traveling, visiting with friends and family and crocheting.   ADVANCED DIRECTIVES: in place  HEALTH MAINTENANCE: Social History   Tobacco Use  . Smoking status: Never Smoker  . Smokeless tobacco: Never Used  Substance Use Topics  . Alcohol use: No  . Drug use: No    Colonoscopy: Nov 02, 2011 PAP: 11/2011 Bone density: showed osteopenia with T -1.9 Lipid panel:  Not on file  Allergies  Allergen Reactions  . Other     PLEASE DO BLOOD PRESSURE IN LEFT ARM ONLY    Current Outpatient Medications  Medication Sig Dispense Refill  . Ascorbic Acid (VITAMIN C) 1000 MG tablet Take 1,000 mg by mouth daily.    . calcium-vitamin D (SM CALCIUM 500/VITAMIN D3) 500-400 MG-UNIT per tablet Take 630 mg by mouth.    . levothyroxine (SYNTHROID) 112 MCG tablet Take 112 mcg by mouth daily before breakfast. Sunday and Wednesday 1/2 tab, 1 tablet all other days.    . magnesium oxide (MAG-OX) 400 (241.3 MG) MG tablet Take 1 tablet (400 mg total) by mouth daily.    Marland Kitchen triamcinolone cream (KENALOG) 0.1 % APP EXT AA BID FOR 14 DAYS  0  . Vitamin D, Ergocalciferol, (DRISDOL) 50000 units CAPS capsule TAKE 1 CAPSULE BY MOUTH EVERY 7 DAYS 12 capsule 4   No current facility-administered medications for this visit.     OBJECTIVE: Older white woman who appears stated age  77:   09/05/17  1254  BP: 135/77  Pulse: 72  Resp: 18  Temp: 97.7 F (36.5 C)  SpO2: 98%     Body mass index is 33.42 kg/m.      ECOG FS: 1 - Symptomatic but completely ambulatory  Sclerae unicteric, EOMs intact Oropharynx clear and moist No cervical or supraclavicular adenopathy Lungs no rales or rhonchi Heart regular rate and rhythm Abd soft, nontender, positive bowel sounds MSK mild kyphosis but no focal spinal tenderness, no upper extremity lymphedema Neuro: nonfocal, well oriented, appropriate affect Breasts:  Status post right lumpectomy and right breast irradiation.  There is no evidence of local recurrence.  The left breast is benign.  Both axillae are benign.  LAB RESULTS: Lab Results  Component Value Date   WBC 7.6 09/05/2017   NEUTROABS 4.7 09/05/2017   HGB 14.1 09/05/2017   HCT 42.6 09/05/2017   MCV 93.8 09/05/2017   PLT 241 09/05/2017      Chemistry      Component Value Date/Time   NA 140 09/05/2017 1209   NA 140 03/07/2017 1219   K 4.4 09/05/2017 1209   K 4.0 03/07/2017 1219   CL 105 09/05/2017 1209   CL 106 01/01/2013 0845   CO2 26 09/05/2017 1209   CO2 23 03/07/2017 1219   BUN 16 09/05/2017 1209   BUN 15.8 03/07/2017 1219   CREATININE 0.71 09/05/2017 1209   CREATININE 0.6 03/07/2017 1219      Component Value Date/Time   CALCIUM 9.4 09/05/2017 1209   CALCIUM 8.9 03/07/2017 1219   ALKPHOS 68 09/05/2017 1209   ALKPHOS 67 03/07/2017 1219   AST 16 09/05/2017 1209   AST 15 03/07/2017 1219   ALT 9 09/05/2017 1209   ALT 12 03/07/2017 1219   BILITOT 0.4 09/05/2017 1209   BILITOT 0.32 03/07/2017 1219      Lab Results  Component Value Date   LABCA2 18 06/25/2012    Urinalysis    Component Value Date/Time   COLORURINE YELLOW 09/14/2011 1013   APPEARANCEUR CLOUDY (A) 09/14/2011 1013   LABSPEC >1.030 (H) 09/14/2011 1013   PHURINE 5.0 09/14/2011 1013   GLUCOSEU NEGATIVE 09/14/2011 1013   HGBUR NEGATIVE 09/14/2011 1013   BILIRUBINUR SMALL (A) 09/14/2011  1013   KETONESUR 15 (A) 09/14/2011 1013   PROTEINUR NEGATIVE 09/14/2011 1013   UROBILINOGEN 0.2 09/14/2011 1013   NITRITE NEGATIVE 09/14/2011 1013   LEUKOCYTESUR NEGATIVE 09/14/2011 1013    STUDIES: She has had a diagnostic mammography at [facility] on 08/25/2017 with results showing: Breast density category: B. No mammographic evidence of malignancy in either breast.   .ASSESSMENT: 77 y.o. BRCA negative [place], [place] woman:  1.  Status post right breast core biopsy 08/31/2011 showing invasive ductal carcinoma, ER 100%, PR 100%, Ki-67 19%, Jamie Cole-2/neu by [facility] no amplification with a ratio 1.56.  2.  Status post right breast lumpectomy with right axillary sentinel node biopsy 09/14/2011 for a pT1b, pN0, stage IA invasive ductal carcinoma, grade 1, again Jamie Cole-2/neu by [facility] no amplification with ratio of 1.32  3. Status post radiation therapy from 10/18/2011 through 11/18/2011.  4. Started antiestrogen therapy with Arimidex in 10/2011, completing 5 years April 2018  5.  Osteopenia:/ osteoporosis  (a) baseline bone scan January 2014 shows a T score of -1.6  (b)  Bone scan January 2016 shows a T score of -1.9  (c) bone density 08/24/2016 shows a T score of -3.1.   (d) denosumab/Prolia started 09/16/2016, to be repeated every 6 months    PLAN:  Ermagene is now 6 years out from definitive surgery for Jamie Cole breast cancer with no evidence of disease recurrence.  This is very favorable.  We are continuing to follow Jamie Cole because she is receiving Prolia through our office every 6 months.  She tolerates this well.  She is of course concerned about osteonecrosis of the jaw.  She wonders how frequently she should have dental radiology.  I think it would be prudent to do a yearly and I will contact Jamie Cole dentist regarding that.  She is just beginning to consider a retirement home and is thinking specifically about [facility].  She will return in 6 months for Jamie Cole next Prolia dose and  then in 1 year.  Before the 1 year visit she will have a bone density and mammography.  At that point she may be ready to "graduate".  She knows to call for any other issues that may develop before the next visit.    Magrinat, Virgie Dad, MD  09/05/17 1:09 PM Medical Oncology and Hematology [facility] [address] [place],  [address] Tel. [phone]    Fax. [phone]    This document serves as a record of services personally performed by Lurline Del, MD. It was created on his behalf by Steva Colder, a trained medical scribe. The creation of this record is based on the scribe's personal observations and the provider's statements to them.   I have reviewed the above documentation for accuracy and completeness, and I agree with the above.

## 2017-09-05 NOTE — Patient Instructions (Signed)
Denosumab injection What is this medicine? DENOSUMAB (den oh sue mab) slows bone breakdown. Prolia is used to treat osteoporosis in women after menopause and in men. Xgeva is used to prevent bone fractures and other bone problems caused by cancer bone metastases. Xgeva is also used to treat giant cell tumor of the bone. COMMON BRAND NAME(S): Prolia, XGEVA What should I tell my health care provider before I take this medicine? They need to know if you have any of these conditions: -dental disease -eczema -infection or history of infections -kidney disease or on dialysis -low blood calcium or vitamin D -malabsorption syndrome -scheduled to have surgery or tooth extraction -taking medicine that contains denosumab -thyroid or parathyroid disease -an unusual reaction to denosumab, other medicines, foods, dyes, or preservatives -pregnant or trying to get pregnant -breast-feeding How should I use this medicine? This medicine is for injection under the skin. It is given by a health care professional in a hospital or clinic setting. If you are getting Prolia, a special MedGuide will be given to you by the pharmacist with each prescription and refill. Be sure to read this information carefully each time. For Prolia, talk to your pediatrician regarding the use of this medicine in children. Special care may be needed. For Xgeva, talk to your pediatrician regarding the use of this medicine in children. While this drug may be prescribed for children as young as 13 years for selected conditions, precautions do apply. What if I miss a dose? It is important not to miss your dose. Call your doctor or health care professional if you are unable to keep an appointment. What may interact with this medicine? Do not take this medicine with any of the following medications: -other medicines containing denosumab This medicine may also interact with the following medications: -medicines that suppress the immune  system -medicines that treat cancer -steroid medicines like prednisone or cortisone What should I watch for while using this medicine? Visit your doctor or health care professional for regular checks on your progress. Your doctor or health care professional may order blood tests and other tests to see how you are doing. Call your doctor or health care professional if you get a cold or other infection while receiving this medicine. Do not treat yourself. This medicine may decrease your body's ability to fight infection. You should make sure you get enough calcium and vitamin D while you are taking this medicine, unless your doctor tells you not to. Discuss the foods you eat and the vitamins you take with your health care professional. See your dentist regularly. Brush and floss your teeth as directed. Before you have any dental work done, tell your dentist you are receiving this medicine. Do not become pregnant while taking this medicine or for 5 months after stopping it. Women should inform their doctor if they wish to become pregnant or think they might be pregnant. There is a potential for serious side effects to an unborn child. Talk to your health care professional or pharmacist for more information. What side effects may I notice from receiving this medicine? Side effects that you should report to your doctor or health care professional as soon as possible: -allergic reactions like skin rash, itching or hives, swelling of the face, lips, or tongue -breathing problems -chest pain -fast, irregular heartbeat -feeling faint or lightheaded, falls -fever, chills, or any other sign of infection -muscle spasms, tightening, or twitches -numbness or tingling -skin blisters or bumps, or is dry, peels, or red -slow   healing or unexplained pain in the mouth or jaw -unusual bleeding or bruising Side effects that usually do not require medical attention (report to your doctor or health care professional  if they continue or are bothersome): -muscle pain -stomach upset, gas Where should I keep my medicine? This medicine is only given in a clinic, doctor's office, or other health care setting and will not be stored at home.  2017 Elsevier/Gold Standard (2015-08-20 10:06:55)  

## 2017-10-14 ENCOUNTER — Other Ambulatory Visit: Payer: Self-pay | Admitting: Obstetrics & Gynecology

## 2017-10-16 NOTE — Telephone Encounter (Signed)
Medication refill request: Vitamin D  Last AEX:  08-16-16  Next AEX: 11-14-17  Last MMG (if hormonal medication request): 08-25-17 WNL  Refill authorized: please advise    Sending to Dr Quincy Simmonds since Dr Sabra Heck is out of the office

## 2017-11-14 ENCOUNTER — Ambulatory Visit (INDEPENDENT_AMBULATORY_CARE_PROVIDER_SITE_OTHER): Payer: Medicare Other | Admitting: Obstetrics & Gynecology

## 2017-11-14 ENCOUNTER — Other Ambulatory Visit: Payer: Self-pay

## 2017-11-14 ENCOUNTER — Encounter: Payer: Self-pay | Admitting: Obstetrics & Gynecology

## 2017-11-14 VITALS — BP 132/80 | HR 68 | Resp 16 | Ht 64.0 in | Wt 197.0 lb

## 2017-11-14 DIAGNOSIS — E559 Vitamin D deficiency, unspecified: Secondary | ICD-10-CM

## 2017-11-14 DIAGNOSIS — Z124 Encounter for screening for malignant neoplasm of cervix: Secondary | ICD-10-CM

## 2017-11-14 DIAGNOSIS — Z01419 Encounter for gynecological examination (general) (routine) without abnormal findings: Secondary | ICD-10-CM

## 2017-11-14 MED ORDER — VITAMIN D (ERGOCALCIFEROL) 1.25 MG (50000 UNIT) PO CAPS: 50000.0000 [IU] | ORAL_CAPSULE | ORAL | 4 refills | Status: DC

## 2017-11-14 NOTE — Progress Notes (Signed)
77 y.o. G1P1001 WidowedCaucasianF here for annual exam.  Doing well.  On Prolia.  Has had 3 injections.  Will have another one in August and then will have repeat BMD in the beginning of next year.    Reports she have some right knee weakness and saw Dr. Maureen Ralphs.    Denies vaginal bleeding.    PCP:  Dr.Tisovec. Has appt in a few weeks for physical with him.  Will have blood work before she sees him.  Patient's last menstrual period was 08/02/1995.          Sexually active: No.  The current method of family planning is post menopausal status.    Exercising: Yes.    gym, bike Smoker:  no  Health Maintenance: Pap:  08/16/16 Neg   03/13/14 Neg  History of abnormal Pap:  no MMG:  08/25/17 BIRADS2:benign   Colonoscopy:  09/2011 polyp. No f/u needed  BMD:   08/24/16 Osteoporosis. -3.1, On Prolia. TDaP:  2015 Pneumonia vaccine(s): 2007 Shingrix:   Zostavax 2007 Hep C testing: n/a Screening Labs: Vit D   reports that she has never smoked. She has never used smokeless tobacco. She reports that she does not drink alcohol or use drugs.  Past Medical History:  Diagnosis Date  . Basal cell carcinoma of neck       . Breast cancer ([place]) 08/31/11   DCIS; right breast, ER/PR +  . Breast cancer ([place]) 2/13   9 MM R breast- E+/P+ HVR 2-  . Endometrial hyperplasia, simple   . Endometrial polyp   . Fracture    right arm  . H/O TB skin testing 1970's   + INH rx  . Hx of radiation therapy 10/18/11 to 11/18/11   right breast  . Hx of radiation therapy 1957   4 tx to face for acne  . Hypothyroidism   . Metacarpal bone fracture 08/30/2012   Right 5th finger fracture  . Osteoporosis   . Personal history of radiation therapy     Past Surgical History:  Procedure Laterality Date  . BREAST LUMPECTOMY  2/13   sentinel node  . endometrial polyp    . HYSTEROSCOPY  12/00   polyps  . MASTECTOMY, PARTIAL  09/14/11   right breast  . TONSILLECTOMY     adenoids    Current Outpatient Medications   Medication Sig Dispense Refill  . Ascorbic Acid (VITAMIN C) 1000 MG tablet Take 1,000 mg by mouth daily.    . calcium-vitamin D (SM CALCIUM 500/VITAMIN D3) 500-400 MG-UNIT per tablet Take 630 mg by mouth.    . denosumab (PROLIA) 60 MG/ML SOLN injection Inject 60 mg into the skin every 6 (six) months. Administer in upper arm, thigh, or abdomen    . levothyroxine (SYNTHROID) 112 MCG tablet Take 112 mcg by mouth daily before breakfast. Sunday and Wednesday 1/2 tab, 1 tablet all other days.    . magnesium oxide (MAG-OX) 400 (241.3 MG) MG tablet Take 1 tablet (400 mg total) by mouth daily.    Marland Kitchen triamcinolone cream (KENALOG) 0.1 % APP EXT AA BID FOR 14 DAYS  0  . Vitamin D, Ergocalciferol, (DRISDOL) 50000 units CAPS capsule TAKE 1 CAPSULE BY MOUTH EVERY 7 DAYS 4 capsule 0   No current facility-administered medications for this visit.     Family History  Problem Relation Age of Onset  . Skin cancer Father 8  . Stroke Father   . Stomach cancer Paternal Grandmother 48  . Breast cancer Paternal  Aunt        x 2  . Breast cancer Cousin        ages 65-60 at dx x 4 paternal  . Prostate cancer Cousin        ????  . Brain cancer Maternal Uncle        brain  . Cancer Cousin        tonsillar  . Stroke Brother   . Colon cancer Neg Hx     Review of Systems  All other systems reviewed and are negative.   Exam:   BP 132/80 (BP Location: Right Arm, Patient Position: Sitting, Cuff Size: Large)   Pulse 68   Resp 16   Ht 5\' 4"  (1.626 m)   Wt 197 lb (89.4 kg)   LMP 08/02/1995   BMI 33.81 kg/m    Height: 5\' 4"  (162.6 cm)  Ht Readings from Last 3 Encounters:  11/14/17 5\' 4"  (1.626 m)  09/05/17 5\' 4"  (1.626 m)  11/03/16 5\' 4"  (1.626 m)    General appearance: alert, cooperative and appears stated age Head: Normocephalic, without obvious abnormality, atraumatic Neck: no adenopathy, supple, symmetrical, trachea midline and thyroid normal to inspection and palpation Lungs: clear to auscultation  bilaterally Breasts: right breast with well healed scar and well healed axillary scar, radiation changes that are stable, left breast without masses, skin changes, LAD Heart: regular rate and rhythm Abdomen: soft, non-tender; bowel sounds normal; no masses,  no organomegaly Extremities: extremities normal, atraumatic, no cyanosis or edema Skin: Skin color, texture, turgor normal. No rashes or lesions Lymph nodes: Cervical, supraclavicular, and axillary nodes normal. No abnormal inguinal nodes palpated Neurologic: Grossly normal   Pelvic: External genitalia:  no lesions              Urethra:  normal appearing urethra with no masses, tenderness or lesions              Bartholins and Skenes: normal                 Declines internal exam  Chaperone was present for exam.  A:  Well Woman with normal exam H/O 1A bresat cancer ER/PR +, s/p lumpectomy and radidation 2013.  Was on Arimidex for five full years.   Osteoporosis, on Prolia H/O Vit D deficiency, on Vit D H/O wrist fracture 2/14 and t12 compression fracture Hypothyroidism H/O endometrial hyperplasia 2000  P:   Mammogram guidelines reviewed.  Doing yearly. pap smear not obtained Vit D obatined today.  RF for 50K weekly to pharmacy.  #12/4RF Seeing Dr. Osborne Casco next week for additional screening lab work and physical return annually or prn

## 2017-11-15 LAB — VITAMIN D 25 HYDROXY (VIT D DEFICIENCY, FRACTURES): VIT D 25 HYDROXY: 46 ng/mL (ref 30.0–100.0)

## 2017-11-28 DIAGNOSIS — E559 Vitamin D deficiency, unspecified: Secondary | ICD-10-CM | POA: Diagnosis not present

## 2017-11-28 DIAGNOSIS — R82998 Other abnormal findings in urine: Secondary | ICD-10-CM | POA: Diagnosis not present

## 2017-11-28 DIAGNOSIS — E7800 Pure hypercholesterolemia, unspecified: Secondary | ICD-10-CM | POA: Diagnosis not present

## 2017-11-28 DIAGNOSIS — E038 Other specified hypothyroidism: Secondary | ICD-10-CM | POA: Diagnosis not present

## 2017-11-28 DIAGNOSIS — R7302 Impaired glucose tolerance (oral): Secondary | ICD-10-CM | POA: Diagnosis not present

## 2017-12-01 ENCOUNTER — Telehealth: Payer: Self-pay | Admitting: Obstetrics & Gynecology

## 2017-12-01 DIAGNOSIS — Z1212 Encounter for screening for malignant neoplasm of rectum: Secondary | ICD-10-CM | POA: Diagnosis not present

## 2017-12-05 DIAGNOSIS — M818 Other osteoporosis without current pathological fracture: Secondary | ICD-10-CM | POA: Diagnosis not present

## 2017-12-05 DIAGNOSIS — M1711 Unilateral primary osteoarthritis, right knee: Secondary | ICD-10-CM | POA: Diagnosis not present

## 2017-12-05 DIAGNOSIS — Z1389 Encounter for screening for other disorder: Secondary | ICD-10-CM | POA: Diagnosis not present

## 2017-12-05 DIAGNOSIS — Z6833 Body mass index (BMI) 33.0-33.9, adult: Secondary | ICD-10-CM | POA: Diagnosis not present

## 2017-12-05 DIAGNOSIS — E038 Other specified hypothyroidism: Secondary | ICD-10-CM | POA: Diagnosis not present

## 2017-12-05 DIAGNOSIS — M160 Bilateral primary osteoarthritis of hip: Secondary | ICD-10-CM | POA: Diagnosis not present

## 2017-12-05 DIAGNOSIS — Z0000 Encounter for general adult medical examination without abnormal findings: Secondary | ICD-10-CM | POA: Diagnosis not present

## 2017-12-05 DIAGNOSIS — E559 Vitamin D deficiency, unspecified: Secondary | ICD-10-CM | POA: Diagnosis not present

## 2017-12-05 DIAGNOSIS — M859 Disorder of bone density and structure, unspecified: Secondary | ICD-10-CM | POA: Diagnosis not present

## 2017-12-05 DIAGNOSIS — K635 Polyp of colon: Secondary | ICD-10-CM | POA: Diagnosis not present

## 2017-12-05 DIAGNOSIS — R7302 Impaired glucose tolerance (oral): Secondary | ICD-10-CM | POA: Diagnosis not present

## 2017-12-05 DIAGNOSIS — E7800 Pure hypercholesterolemia, unspecified: Secondary | ICD-10-CM | POA: Diagnosis not present

## 2018-01-08 DIAGNOSIS — Z961 Presence of intraocular lens: Secondary | ICD-10-CM | POA: Diagnosis not present

## 2018-01-08 DIAGNOSIS — H4323 Crystalline deposits in vitreous body, bilateral: Secondary | ICD-10-CM | POA: Diagnosis not present

## 2018-01-08 DIAGNOSIS — H52203 Unspecified astigmatism, bilateral: Secondary | ICD-10-CM | POA: Diagnosis not present

## 2018-01-13 DIAGNOSIS — L0390 Cellulitis, unspecified: Secondary | ICD-10-CM | POA: Diagnosis not present

## 2018-03-05 ENCOUNTER — Inpatient Hospital Stay: Payer: Medicare Other | Attending: Oncology

## 2018-03-05 ENCOUNTER — Inpatient Hospital Stay: Payer: Medicare Other

## 2018-03-05 VITALS — BP 141/67 | HR 69 | Temp 97.4°F | Resp 18

## 2018-03-05 DIAGNOSIS — M818 Other osteoporosis without current pathological fracture: Secondary | ICD-10-CM

## 2018-03-05 DIAGNOSIS — Z79899 Other long term (current) drug therapy: Secondary | ICD-10-CM | POA: Insufficient documentation

## 2018-03-05 DIAGNOSIS — T386X5A Adverse effect of antigonadotrophins, antiestrogens, antiandrogens, not elsewhere classified, initial encounter: Secondary | ICD-10-CM

## 2018-03-05 DIAGNOSIS — Z170 Estrogen receptor positive status [ER+]: Secondary | ICD-10-CM

## 2018-03-05 DIAGNOSIS — C50411 Malignant neoplasm of upper-outer quadrant of right female breast: Secondary | ICD-10-CM

## 2018-03-05 LAB — CMP (CANCER CENTER ONLY)
ALBUMIN: 4 g/dL (ref 3.5–5.0)
ALT: 13 U/L (ref 0–44)
AST: 17 U/L (ref 15–41)
Alkaline Phosphatase: 60 U/L (ref 38–126)
Anion gap: 12 (ref 5–15)
BUN: 13 mg/dL (ref 8–23)
CHLORIDE: 108 mmol/L (ref 98–111)
CO2: 22 mmol/L (ref 22–32)
CREATININE: 0.7 mg/dL (ref 0.44–1.00)
Calcium: 9 mg/dL (ref 8.9–10.3)
GFR, Est AFR Am: >60 mL/min (ref >60)
GFR, Estimated: >60 mL/min (ref >60)
GLUCOSE: 91 mg/dL (ref 70–99)
Potassium: 4.3 mmol/L (ref 3.5–5.1)
SODIUM: 142 mmol/L (ref 135–145)
Total Bilirubin: 0.4 mg/dL (ref 0.3–1.2)
Total Protein: 6.5 g/dL (ref 6.5–8.1)

## 2018-03-05 LAB — CBC WITH DIFFERENTIAL/PLATELET
BASOS ABS: 0.1 10*3/uL (ref 0.0–0.1)
BASOS PCT: 1 %
EOS ABS: 0.3 10*3/uL (ref 0.0–0.5)
EOS PCT: 5 %
HCT: 42.3 % (ref 34.8–46.6)
Hemoglobin: 14.2 g/dL (ref 11.6–15.9)
Lymphocytes Relative: 22 %
Lymphs Abs: 1.4 10*3/uL (ref 0.9–3.3)
MCH: 31.9 pg (ref 25.1–34.0)
MCHC: 33.5 g/dL (ref 31.5–36.0)
MCV: 95.1 fL (ref 79.5–101.0)
Monocytes Absolute: 0.6 10*3/uL (ref 0.1–0.9)
Monocytes Relative: 9 %
Neutro Abs: 4 10*3/uL (ref 1.5–6.5)
Neutrophils Relative %: 63 %
PLATELETS: 234 10*3/uL (ref 145–400)
RBC: 4.45 MIL/uL (ref 3.70–5.45)
RDW: 13.6 % (ref 11.2–14.5)
WBC: 6.4 10*3/uL (ref 3.9–10.3)

## 2018-03-05 MED ORDER — DENOSUMAB 60 MG/ML ~~LOC~~ SOLN
60.0000 mg | Freq: Once | SUBCUTANEOUS | Status: AC
  Administered 2018-03-05: 60 mg via SUBCUTANEOUS

## 2018-04-27 DIAGNOSIS — Z23 Encounter for immunization: Secondary | ICD-10-CM | POA: Diagnosis not present

## 2018-06-08 DIAGNOSIS — E7800 Pure hypercholesterolemia, unspecified: Secondary | ICD-10-CM | POA: Diagnosis not present

## 2018-06-08 DIAGNOSIS — E559 Vitamin D deficiency, unspecified: Secondary | ICD-10-CM | POA: Diagnosis not present

## 2018-06-08 DIAGNOSIS — M818 Other osteoporosis without current pathological fracture: Secondary | ICD-10-CM | POA: Diagnosis not present

## 2018-06-08 DIAGNOSIS — M1711 Unilateral primary osteoarthritis, right knee: Secondary | ICD-10-CM | POA: Diagnosis not present

## 2018-06-08 DIAGNOSIS — E668 Other obesity: Secondary | ICD-10-CM | POA: Diagnosis not present

## 2018-06-08 DIAGNOSIS — Z6832 Body mass index (BMI) 32.0-32.9, adult: Secondary | ICD-10-CM | POA: Diagnosis not present

## 2018-06-08 DIAGNOSIS — E038 Other specified hypothyroidism: Secondary | ICD-10-CM | POA: Diagnosis not present

## 2018-06-08 DIAGNOSIS — R7302 Impaired glucose tolerance (oral): Secondary | ICD-10-CM | POA: Diagnosis not present

## 2018-06-14 DIAGNOSIS — M25551 Pain in right hip: Secondary | ICD-10-CM | POA: Diagnosis not present

## 2018-06-14 DIAGNOSIS — M25561 Pain in right knee: Secondary | ICD-10-CM | POA: Diagnosis not present

## 2018-07-12 ENCOUNTER — Other Ambulatory Visit: Payer: Self-pay | Admitting: Oncology

## 2018-07-13 ENCOUNTER — Telehealth: Payer: Self-pay | Admitting: Oncology

## 2018-07-13 NOTE — Telephone Encounter (Signed)
R/s appt per 12/12 sch message - unable to reach patient . Left message for patient with appt date and time

## 2018-07-17 DIAGNOSIS — D225 Melanocytic nevi of trunk: Secondary | ICD-10-CM | POA: Diagnosis not present

## 2018-07-17 DIAGNOSIS — L570 Actinic keratosis: Secondary | ICD-10-CM | POA: Diagnosis not present

## 2018-07-17 DIAGNOSIS — L814 Other melanin hyperpigmentation: Secondary | ICD-10-CM | POA: Diagnosis not present

## 2018-07-17 DIAGNOSIS — L821 Other seborrheic keratosis: Secondary | ICD-10-CM | POA: Diagnosis not present

## 2018-07-17 DIAGNOSIS — Z86018 Personal history of other benign neoplasm: Secondary | ICD-10-CM | POA: Diagnosis not present

## 2018-07-17 DIAGNOSIS — Z23 Encounter for immunization: Secondary | ICD-10-CM | POA: Diagnosis not present

## 2018-07-17 DIAGNOSIS — Z85828 Personal history of other malignant neoplasm of skin: Secondary | ICD-10-CM | POA: Diagnosis not present

## 2018-07-22 DIAGNOSIS — J029 Acute pharyngitis, unspecified: Secondary | ICD-10-CM | POA: Diagnosis not present

## 2018-07-27 DIAGNOSIS — M7090 Unspecified soft tissue disorder related to use, overuse and pressure of unspecified site: Secondary | ICD-10-CM | POA: Diagnosis not present

## 2018-07-27 DIAGNOSIS — Z6831 Body mass index (BMI) 31.0-31.9, adult: Secondary | ICD-10-CM | POA: Diagnosis not present

## 2018-07-27 DIAGNOSIS — R05 Cough: Secondary | ICD-10-CM | POA: Diagnosis not present

## 2018-07-27 DIAGNOSIS — J029 Acute pharyngitis, unspecified: Secondary | ICD-10-CM | POA: Diagnosis not present

## 2018-08-02 DIAGNOSIS — J029 Acute pharyngitis, unspecified: Secondary | ICD-10-CM | POA: Diagnosis not present

## 2018-08-07 ENCOUNTER — Encounter: Payer: Self-pay | Admitting: Oncology

## 2018-08-07 NOTE — Progress Notes (Signed)
[facility]  [phone] [phone]) [phone]  OFFICE PROGRESS NOTE   ID: Jamie Cole   DOB: 1941/05/28  MR#: [id number]  NAT#:[id number]   PCP: Jamie Pao, MD GYN:  SUFanny Cole, M.D. RAD ONC: Jamie Cole, M.D.  CHIEF COMPLAINT: Estrogen receptor positive early stage breast cancer   CURRENT TREATMENT: denosumab/Prolia   BREAST CANCER ISTORY From Dr. Julien Cole new patient evaluation note dated 09/07/2011:  "This is a delightful 78 year old woman here today with her family for discussion of her recent diagnosis of breast cancer. She does undergo annual screening mammography. She did not take any abnormalities in her breast. A screening mammogram 08/15/2011 showed a possible mass in the right breast additional views were recommended. Last mammogram right breast ultrasound performed 08/31/2011 showed a suspicious 5 x 6 x 6 mm mass in the upper outer right breast with calcifications and a biopsy was recommended. Biopsy. performed 08/31/2011 demonstrated a invasive ductal cancer likely grade 2 HER-2 was negative the ratio 1.56 the tumor was strongly ER and PR positive. An MRI scans performed to 09/06/11 showed a mass in the right breast measuring 9 x 9 x 8 mm. No other lesions were seen. There is no other evidence of adenopathy. A 1 cm lesion in the left lobe of the liver was seen likely a cyst."    Her subsequent history is as detailed below.    INTERVAL HISTORY: Jamie Cole returns today for follow-up and treatment of her estrogen receptor positive breast cancer.   The patient continues on denosumab/Prolia, with her most recent dose received on 03/05/2018. She says that she feels a few aches about three or four days following the treatment as it goes through her system.   Her last mammogram was 08/25/2017 and she will be due for repeat later this month. Her last bone density was 08/19/2016; she will be due for repeat later this month.  Since her last  visit here, she has not undergone any recent studies.       REVIEW OF SYSTEMS: Jamie Cole states that most of her family had gotten sick around Christmas, so they didn't get together until the following weekend. She was given amoxicillin to treat her illness, which she believes may have been Streptococcal Pharyngitis, with positive results; she states that her throat is still a little red. The patient denies unusual headaches, visual changes, nausea, vomiting, or dizziness. There has been no unusual cough, phlegm production, or pleurisy. This been no change in bowel or bladder habits. The patient denies unexplained fatigue or unexplained weight loss, bleeding, rash, or fever. A detailed review of systems was otherwise noncontributory.    PAST MEDICAL HISTORY: Past Medical History:  Diagnosis Date  . Basal cell carcinoma of neck       . Breast cancer ([place]) 08/31/11   DCIS; right breast, ER/PR +  . Breast cancer ([place]) 2/13   9 MM R breast- E+/P+ HVR 2-  . Endometrial hyperplasia, simple   . Endometrial polyp   . Fracture    right arm  . H/O TB skin testing 1970's   + INH rx  . Hx of radiation therapy 10/18/11 to 11/18/11   right breast  . Hx of radiation therapy 1957   4 tx to face for acne  . Hypothyroidism   . Metacarpal bone fracture 08/30/2012   Right 5th finger fracture  . Osteoporosis   . Personal history of radiation therapy     PAST SURGICAL HISTORY: Past Surgical  History:  Procedure Laterality Date  . BREAST LUMPECTOMY  2/13   sentinel node  . endometrial polyp    . HYSTEROSCOPY  12/00   polyps  . MASTECTOMY, PARTIAL  09/14/11   right breast  . TONSILLECTOMY     adenoids    FAMILY HISTORY Family History  Problem Relation Age of Onset  . Skin cancer Father 29  . Stroke Father   . Stomach cancer Paternal Grandmother 57  . Breast cancer Paternal Aunt        x 2  . Breast cancer Cousin        ages 63-60 at dx x 4 paternal  . Prostate cancer Cousin        ????  .  Brain cancer Maternal Uncle        brain  . Cancer Cousin        tonsillar  . Stroke Brother   . Colon cancer Neg Hx     GYNECOLOGIC HISTORY: G1P1, menarche 89, menopause 63, HRT x 2-3y w/o unusual complications  SOCIAL HISTORY: The patient and her husband Jamie Cole have been married since 62.  She is a retired Pharmacist, hospital and her husband  a retired Civil [employer] fast streamer.  He died in early 2016/09/18 patient's son is an Forensic psychologist in [employer] and is a Occupational psychologist. The patient has a granddaughter and t 4 grandcats. In her spare time the patient enjoys traveling, visiting with friends and family and crocheting.   ADVANCED DIRECTIVES: in place  HEALTH MAINTENANCE: Social History   Tobacco Use  . Smoking status: Never Smoker  . Smokeless tobacco: Never Used  Substance Use Topics  . Alcohol use: No  . Drug use: No    Colonoscopy: 09/2011 PAP: 11/2011 Bone density: showed osteopenia with T -1.9 Lipid panel:  Not on file  Allergies  Allergen Reactions  . Other     PLEASE DO BLOOD PRESSURE IN LEFT ARM ONLY    Current Outpatient Medications  Medication Sig Dispense Refill  . Ascorbic Acid (VITAMIN C) 1000 MG tablet Take 1,000 mg by mouth daily.    . calcium-vitamin D (SM CALCIUM 500/VITAMIN D3) 500-400 MG-UNIT per tablet Take 630 mg by mouth.    . denosumab (PROLIA) 60 MG/ML SOLN injection Inject 60 mg into the skin every 6 (six) months. Administer in upper arm, thigh, or abdomen    . levothyroxine (SYNTHROID) 112 MCG tablet Take 112 mcg by mouth daily before breakfast. 09/18/22 and Wednesday 1/2 tab, 1 tablet all other days.    . magnesium oxide (MAG-OX) 400 (241.3 MG) MG tablet Take 1 tablet (400 mg total) by mouth daily.    Marland Kitchen triamcinolone cream (KENALOG) 0.1 % APP EXT AA BID FOR 14 DAYS  0  . Vitamin D, Ergocalciferol, (DRISDOL) 50000 units CAPS capsule Take 1 capsule (50,000 Units total) by mouth every 7 (seven) days. 12 capsule 4   No current facility-administered medications for  this visit.     OBJECTIVE: Older white woman in no acute distress Vitals:   08/09/18 1310  BP: 136/65  Pulse: 76  Resp: 18  Temp: 97.7 F (36.5 C)  SpO2: 99%     Body mass index is 32.42 kg/m.      ECOG FS: 1 - Symptomatic but completely ambulatory  Sclerae unicteric, pupils round and equal No cervical or supraclavicular adenopathy Lungs no rales or rhonchi Heart regular rate and rhythm Abd soft, nontender, positive bowel sounds MSK no focal spinal tenderness, no upper extremity  lymphedema Neuro: nonfocal, well oriented, appropriate affect Breasts: The right breast is status post lumpectomy and radiation.  There is no evidence of disease recurrence.  The left breast is benign.  Both axillae are benign. Skin: At the area of the "X" in the photo below there is a 1 to 1/2 cm firm area which is minimally tender, longer than wide, not associated with erythema, and not fluctuant.  It feels like scar or fat necrosis  Photo 08/09/2018     LAB RESULTS: Lab Results  Component Value Date   WBC 7.4 08/09/2018   NEUTROABS 4.3 08/09/2018   HGB 14.1 08/09/2018   HCT 43.7 08/09/2018   MCV 95.2 08/09/2018   PLT 326 08/09/2018      Chemistry      Component Value Date/Time   NA 142 03/05/2018 1248   NA 140 03/07/2017 1219   K 4.3 03/05/2018 1248   K 4.0 03/07/2017 1219   CL 108 03/05/2018 1248   CL 106 01/01/2013 0845   CO2 22 03/05/2018 1248   CO2 23 03/07/2017 1219   BUN 13 03/05/2018 1248   BUN 15.8 03/07/2017 1219   CREATININE 0.70 03/05/2018 1248   CREATININE 0.6 03/07/2017 1219      Component Value Date/Time   CALCIUM 9.0 03/05/2018 1248   CALCIUM 8.9 03/07/2017 1219   ALKPHOS 60 03/05/2018 1248   ALKPHOS 67 03/07/2017 1219   AST 17 03/05/2018 1248   AST 15 03/07/2017 1219   ALT 13 03/05/2018 1248   ALT 12 03/07/2017 1219   BILITOT 0.4 03/05/2018 1248   BILITOT 0.32 03/07/2017 1219      Lab Results  Component Value Date   LABCA2 18 06/25/2012     Urinalysis    Component Value Date/Time   COLORURINE YELLOW 09/14/2011 1013   APPEARANCEUR CLOUDY (A) 09/14/2011 1013   LABSPEC >1.030 (H) 09/14/2011 1013   PHURINE 5.0 09/14/2011 1013   GLUCOSEU NEGATIVE 09/14/2011 1013   HGBUR NEGATIVE 09/14/2011 1013   BILIRUBINUR SMALL (A) 09/14/2011 1013   KETONESUR 15 (A) 09/14/2011 1013   PROTEINUR NEGATIVE 09/14/2011 1013   UROBILINOGEN 0.2 09/14/2011 1013   NITRITE NEGATIVE 09/14/2011 1013   LEUKOCYTESUR NEGATIVE 09/14/2011 1013    STUDIES: No results found.    .ASSESSMENT: 78 y.o. BRCA negative Jamie Cole, [place] woman:  1.  Status post right breast core biopsy 08/31/2011 showing invasive ductal carcinoma, ER 100%, PR 100%, Ki-67 19%, HER-2/neu by CISH no amplification with a ratio 1.56.  2.  Status post right breast lumpectomy with right axillary sentinel node biopsy 09/14/2011 for a pT1b, pN0, stage IA invasive ductal carcinoma, grade 1, again HER-2/neu by CISH no amplification with ratio of 1.32  3. Status post radiation therapy from 10/18/2011 through 11/18/2011.  4. Started antiestrogen therapy with Arimidex in 10/2011, completing 5 years April 2018  5.  Osteopenia:/ osteoporosis  (a) baseline bone scan January 2014 shows a T score of -1.6  (b)  Bone scan January 2016 shows a T score of -1.9  (c) bone density 08/24/2016 shows a T score of -3.1.   (d) denosumab/Prolia started 09/16/2016, to be repeated every 6 months    PLAN:  Nandana is just about 7 years out from definitive surgery for breast cancer with no evidence of disease recurrence.  This is very favorable.  She is under Prolia treatment for her osteoporosis.  She will have her next bone density later this month.  She will have a mammogram at the same  time.  A slightly tender area in her abdomen, imaged above.  I think what were dealing with there is either fat necrosis or scar tissue but of course a tumor metastatic to that area would feel the same.  I  cannot imagine what tumor would be doing this and why she would have one single metastatic site and therefore I think this is unlikely to be cancer related.  After much discussion what we decided to do is she would return the first Wednesday in February not for a full visit but just for a quick drop-in where I can examine her abdomen again.  If anything further has developed in that area she will have a CT scan of the chest.  Otherwise she will will have Prolia again in July 2020 and February 2021 and I will see her with the February dose.  She knows to call for any other issue that may develop before then.     [place], Virgie Dad, MD  08/09/18 1:52 PM Medical Oncology and Hematology [facility] [address] [place], [place] [address] Tel. [phone]    Fax. [phone]    I, Jacqualyn Posey am acting as a Education administrator for Chauncey Cruel, MD.   I, Lurline Del MD, have reviewed the above documentation for accuracy and completeness, and I agree with the above.

## 2018-08-08 ENCOUNTER — Ambulatory Visit: Payer: Medicare Other

## 2018-08-08 ENCOUNTER — Other Ambulatory Visit: Payer: Medicare Other

## 2018-08-08 ENCOUNTER — Ambulatory Visit: Payer: Medicare Other | Admitting: Oncology

## 2018-08-09 ENCOUNTER — Inpatient Hospital Stay: Payer: Medicare Other | Attending: Oncology

## 2018-08-09 ENCOUNTER — Inpatient Hospital Stay: Payer: Medicare Other

## 2018-08-09 ENCOUNTER — Inpatient Hospital Stay (HOSPITAL_BASED_OUTPATIENT_CLINIC_OR_DEPARTMENT_OTHER): Payer: Medicare Other | Admitting: Oncology

## 2018-08-09 ENCOUNTER — Telehealth: Payer: Self-pay | Admitting: Oncology

## 2018-08-09 VITALS — BP 136/65 | HR 76 | Temp 97.7°F | Resp 18 | Wt 188.9 lb

## 2018-08-09 DIAGNOSIS — Z170 Estrogen receptor positive status [ER+]: Principal | ICD-10-CM

## 2018-08-09 DIAGNOSIS — Z85828 Personal history of other malignant neoplasm of skin: Secondary | ICD-10-CM

## 2018-08-09 DIAGNOSIS — Z8042 Family history of malignant neoplasm of prostate: Secondary | ICD-10-CM

## 2018-08-09 DIAGNOSIS — M810 Age-related osteoporosis without current pathological fracture: Secondary | ICD-10-CM | POA: Insufficient documentation

## 2018-08-09 DIAGNOSIS — Z853 Personal history of malignant neoplasm of breast: Secondary | ICD-10-CM | POA: Diagnosis not present

## 2018-08-09 DIAGNOSIS — M818 Other osteoporosis without current pathological fracture: Secondary | ICD-10-CM

## 2018-08-09 DIAGNOSIS — T386X5A Adverse effect of antigonadotrophins, antiestrogens, antiandrogens, not elsewhere classified, initial encounter: Secondary | ICD-10-CM

## 2018-08-09 DIAGNOSIS — Z79899 Other long term (current) drug therapy: Secondary | ICD-10-CM | POA: Diagnosis not present

## 2018-08-09 DIAGNOSIS — C50411 Malignant neoplasm of upper-outer quadrant of right female breast: Secondary | ICD-10-CM

## 2018-08-09 DIAGNOSIS — Z803 Family history of malignant neoplasm of breast: Secondary | ICD-10-CM

## 2018-08-09 DIAGNOSIS — Z923 Personal history of irradiation: Secondary | ICD-10-CM

## 2018-08-09 DIAGNOSIS — Z9011 Acquired absence of right breast and nipple: Secondary | ICD-10-CM | POA: Diagnosis not present

## 2018-08-09 DIAGNOSIS — E039 Hypothyroidism, unspecified: Secondary | ICD-10-CM | POA: Insufficient documentation

## 2018-08-09 LAB — CBC WITH DIFFERENTIAL/PLATELET
ABS IMMATURE GRANULOCYTES: 0.01 10*3/uL (ref 0.00–0.07)
Basophils Absolute: 0.1 10*3/uL (ref 0.0–0.1)
Basophils Relative: 2 %
Eosinophils Absolute: 0.4 10*3/uL (ref 0.0–0.5)
Eosinophils Relative: 6 %
HEMATOCRIT: 43.7 % (ref 36.0–46.0)
HEMOGLOBIN: 14.1 g/dL (ref 12.0–15.0)
IMMATURE GRANULOCYTES: 0 %
LYMPHS ABS: 1.8 10*3/uL (ref 0.7–4.0)
Lymphocytes Relative: 24 %
MCH: 30.7 pg (ref 26.0–34.0)
MCHC: 32.3 g/dL (ref 30.0–36.0)
MCV: 95.2 fL (ref 80.0–100.0)
MONOS PCT: 10 %
Monocytes Absolute: 0.7 10*3/uL (ref 0.1–1.0)
NEUTROS ABS: 4.3 10*3/uL (ref 1.7–7.7)
NEUTROS PCT: 58 %
Platelets: 326 10*3/uL (ref 150–400)
RBC: 4.59 MIL/uL (ref 3.87–5.11)
RDW: 13.4 % (ref 11.5–15.5)
WBC: 7.4 10*3/uL (ref 4.0–10.5)
nRBC: 0 % (ref 0.0–0.2)

## 2018-08-09 LAB — COMPREHENSIVE METABOLIC PANEL
ALBUMIN: 3.7 g/dL (ref 3.5–5.0)
ALT: 11 U/L (ref 0–44)
AST: 15 U/L (ref 15–41)
Alkaline Phosphatase: 72 U/L (ref 38–126)
Anion gap: 11 (ref 5–15)
BUN: 13 mg/dL (ref 8–23)
CO2: 24 mmol/L (ref 22–32)
Calcium: 9.1 mg/dL (ref 8.9–10.3)
Chloride: 108 mmol/L (ref 98–111)
Creatinine, Ser: 0.65 mg/dL (ref 0.44–1.00)
GFR calc Af Amer: >60 mL/min (ref >60)
GFR calc non Af Amer: >60 mL/min (ref >60)
Glucose, Bld: 89 mg/dL (ref 70–99)
Potassium: 4.3 mmol/L (ref 3.5–5.1)
Sodium: 143 mmol/L (ref 135–145)
Total Bilirubin: 0.4 mg/dL (ref 0.3–1.2)
Total Protein: 6.6 g/dL (ref 6.5–8.1)

## 2018-08-09 MED ORDER — DENOSUMAB 60 MG/ML ~~LOC~~ SOSY
60.0000 mg | PREFILLED_SYRINGE | Freq: Once | SUBCUTANEOUS | Status: AC
  Administered 2018-08-09: 60 mg via SUBCUTANEOUS

## 2018-08-09 NOTE — Progress Notes (Signed)
Ok to treat without CMET today per Dr. Jana Hakim. Amilyah Nack LPN

## 2018-08-09 NOTE — Telephone Encounter (Signed)
Gave avs and calendar ° °

## 2018-08-23 DIAGNOSIS — H9193 Unspecified hearing loss, bilateral: Secondary | ICD-10-CM | POA: Diagnosis not present

## 2018-08-23 DIAGNOSIS — H6983 Other specified disorders of Eustachian tube, bilateral: Secondary | ICD-10-CM | POA: Diagnosis not present

## 2018-08-27 ENCOUNTER — Ambulatory Visit
Admission: RE | Admit: 2018-08-27 | Discharge: 2018-08-27 | Disposition: A | Discharge status: Home / Self Care | Payer: Medicare Other | Source: Ambulatory Visit | Attending: Oncology | Admitting: Oncology

## 2018-08-27 DIAGNOSIS — C50411 Malignant neoplasm of upper-outer quadrant of right female breast: Secondary | ICD-10-CM

## 2018-08-27 DIAGNOSIS — M818 Other osteoporosis without current pathological fracture: Secondary | ICD-10-CM

## 2018-08-27 DIAGNOSIS — Z170 Estrogen receptor positive status [ER+]: Principal | ICD-10-CM

## 2018-08-27 DIAGNOSIS — R928 Other abnormal and inconclusive findings on diagnostic imaging of breast: Secondary | ICD-10-CM | POA: Diagnosis not present

## 2018-08-27 DIAGNOSIS — T386X5A Adverse effect of antigonadotrophins, antiestrogens, antiandrogens, not elsewhere classified, initial encounter: Secondary | ICD-10-CM

## 2018-08-29 DIAGNOSIS — Z23 Encounter for immunization: Secondary | ICD-10-CM | POA: Diagnosis not present

## 2018-08-29 DIAGNOSIS — L570 Actinic keratosis: Secondary | ICD-10-CM | POA: Diagnosis not present

## 2018-08-29 DIAGNOSIS — L821 Other seborrheic keratosis: Secondary | ICD-10-CM | POA: Diagnosis not present

## 2018-09-03 ENCOUNTER — Ambulatory Visit
Admission: RE | Admit: 2018-09-03 | Discharge: 2018-09-03 | Disposition: A | Discharge status: Home / Self Care | Payer: Medicare Other | Source: Ambulatory Visit | Attending: Oncology | Admitting: Oncology

## 2018-09-03 DIAGNOSIS — C50411 Malignant neoplasm of upper-outer quadrant of right female breast: Secondary | ICD-10-CM

## 2018-09-03 DIAGNOSIS — Z780 Asymptomatic menopausal state: Secondary | ICD-10-CM | POA: Diagnosis not present

## 2018-09-03 DIAGNOSIS — M8589 Other specified disorders of bone density and structure, multiple sites: Secondary | ICD-10-CM | POA: Diagnosis not present

## 2018-09-03 DIAGNOSIS — Z170 Estrogen receptor positive status [ER+]: Principal | ICD-10-CM

## 2018-09-03 DIAGNOSIS — M818 Other osteoporosis without current pathological fracture: Secondary | ICD-10-CM

## 2018-09-03 DIAGNOSIS — T386X5A Adverse effect of antigonadotrophins, antiestrogens, antiandrogens, not elsewhere classified, initial encounter: Secondary | ICD-10-CM

## 2018-09-04 DIAGNOSIS — M79675 Pain in left toe(s): Secondary | ICD-10-CM | POA: Diagnosis not present

## 2018-09-05 ENCOUNTER — Other Ambulatory Visit: Payer: Self-pay | Admitting: Oncology

## 2018-09-05 NOTE — Progress Notes (Unsigned)
Jamie Cole a dropped in today for me to look at the spot in her abdomen.  I do not palpate anything.  She has minimal tenderness there.  I think this is benign and really does not require further evaluation.  I gave her the results of her mammogram and also her bone density which is improved  She has concerns regarding spinal versus general anesthesia.  I told her generally the spinal is safer.

## 2018-09-10 ENCOUNTER — Other Ambulatory Visit: Payer: Medicare Other

## 2018-09-13 ENCOUNTER — Telehealth: Payer: Self-pay | Admitting: Emergency Medicine

## 2018-09-13 NOTE — Telephone Encounter (Signed)
Message left to return call to Jamie Cole at [phone].    

## 2018-09-13 NOTE — Telephone Encounter (Signed)
-----   Message from Megan Salon, MD sent at 09/06/2018  4:57 PM EST ----- Please let pt know her BMD was much better in her spine and changed from -3.1 to -1.6.  Also, her hip improved from -2.0 to -1.6.  She now just has osteopenia.  It is recommended to start continue therapy now with either fosamax or reclast.  Can wait six months after last prolia to start Fosamax.  [place] for OV if has lost of questions.

## 2018-10-11 DIAGNOSIS — M1711 Unilateral primary osteoarthritis, right knee: Secondary | ICD-10-CM | POA: Diagnosis not present

## 2018-11-02 NOTE — Telephone Encounter (Signed)
Spoke with patient. Patient states that she already received Prolia with Dr.Magrinat in January and has spoken with him regarding BMD and is scheduled for another Prolia in July. States that she has an appointment with Dr.Miller in May and will speak with her about alternatives then Encounter closed.

## 2018-12-14 ENCOUNTER — Ambulatory Visit: Payer: Medicare Other | Admitting: Obstetrics & Gynecology

## 2018-12-21 DIAGNOSIS — R82998 Other abnormal findings in urine: Secondary | ICD-10-CM | POA: Diagnosis not present

## 2018-12-21 DIAGNOSIS — E7800 Pure hypercholesterolemia, unspecified: Secondary | ICD-10-CM | POA: Diagnosis not present

## 2018-12-21 DIAGNOSIS — E038 Other specified hypothyroidism: Secondary | ICD-10-CM | POA: Diagnosis not present

## 2018-12-21 DIAGNOSIS — E559 Vitamin D deficiency, unspecified: Secondary | ICD-10-CM | POA: Diagnosis not present

## 2018-12-21 DIAGNOSIS — R7302 Impaired glucose tolerance (oral): Secondary | ICD-10-CM | POA: Diagnosis not present

## 2018-12-28 ENCOUNTER — Telehealth: Payer: Self-pay | Admitting: Obstetrics & Gynecology

## 2018-12-28 DIAGNOSIS — E7800 Pure hypercholesterolemia, unspecified: Secondary | ICD-10-CM | POA: Diagnosis not present

## 2018-12-28 DIAGNOSIS — M818 Other osteoporosis without current pathological fracture: Secondary | ICD-10-CM | POA: Diagnosis not present

## 2018-12-28 DIAGNOSIS — Z0000 Encounter for general adult medical examination without abnormal findings: Secondary | ICD-10-CM | POA: Diagnosis not present

## 2018-12-28 DIAGNOSIS — D0590 Unspecified type of carcinoma in situ of unspecified breast: Secondary | ICD-10-CM | POA: Diagnosis not present

## 2018-12-28 DIAGNOSIS — N390 Urinary tract infection, site not specified: Secondary | ICD-10-CM | POA: Diagnosis not present

## 2018-12-28 DIAGNOSIS — M160 Bilateral primary osteoarthritis of hip: Secondary | ICD-10-CM | POA: Diagnosis not present

## 2018-12-28 DIAGNOSIS — M179 Osteoarthritis of knee, unspecified: Secondary | ICD-10-CM | POA: Diagnosis not present

## 2018-12-28 DIAGNOSIS — K635 Polyp of colon: Secondary | ICD-10-CM | POA: Diagnosis not present

## 2018-12-28 DIAGNOSIS — E559 Vitamin D deficiency, unspecified: Secondary | ICD-10-CM | POA: Diagnosis not present

## 2018-12-28 DIAGNOSIS — E039 Hypothyroidism, unspecified: Secondary | ICD-10-CM | POA: Diagnosis not present

## 2018-12-28 DIAGNOSIS — Z1331 Encounter for screening for depression: Secondary | ICD-10-CM | POA: Diagnosis not present

## 2018-12-28 DIAGNOSIS — R7302 Impaired glucose tolerance (oral): Secondary | ICD-10-CM | POA: Diagnosis not present

## 2018-12-28 DIAGNOSIS — Z1339 Encounter for screening examination for other mental health and behavioral disorders: Secondary | ICD-10-CM | POA: Diagnosis not present

## 2018-12-28 DIAGNOSIS — E669 Obesity, unspecified: Secondary | ICD-10-CM | POA: Diagnosis not present

## 2018-12-28 NOTE — Telephone Encounter (Signed)
Last AEX 11/14/17 Vit D 46  Labs reviewed from [facility]  12/21/18 Vitamin D: 34.3  Patient requesting refill of Vitamin D.   Dr. Sabra Heck -please advise.

## 2018-12-28 NOTE — Telephone Encounter (Signed)
Lab results from Dr. Osborne Casco on cart for Dr. Sabra Heck to review. Patient would like to know if these can be used to get her refill on Vitamin D. She will call to reschedule her annual at a later date.

## 2018-12-30 ENCOUNTER — Other Ambulatory Visit: Payer: Self-pay | Admitting: Obstetrics & Gynecology

## 2018-12-30 MED ORDER — VITAMIN D (ERGOCALCIFEROL) 1.25 MG (50000 UNIT) PO CAPS: 50000.0000 [IU] | ORAL_CAPSULE | ORAL | 4 refills | Status: DC

## 2018-12-30 NOTE — Telephone Encounter (Signed)
Labs reviewed and rx was completed.  Please let her know.  Thanks.

## 2018-12-31 NOTE — Telephone Encounter (Signed)
Spoke with patient, advised as seen below per Dr. Miller. Patient verbalizes understanding and is agreeable.   Encounter closed.  

## 2019-01-03 ENCOUNTER — Telehealth: Payer: Self-pay | Admitting: Obstetrics & Gynecology

## 2019-01-03 NOTE — Telephone Encounter (Signed)
Patient called and requests call back from Dr. Sabra Heck. Patient states she has compression in her spine and wants to know if it would be safe to have knee surgery. Please advise.

## 2019-01-09 DIAGNOSIS — H52203 Unspecified astigmatism, bilateral: Secondary | ICD-10-CM | POA: Diagnosis not present

## 2019-01-09 DIAGNOSIS — H4323 Crystalline deposits in vitreous body, bilateral: Secondary | ICD-10-CM | POA: Diagnosis not present

## 2019-01-09 DIAGNOSIS — Z961 Presence of intraocular lens: Secondary | ICD-10-CM | POA: Diagnosis not present

## 2019-01-10 DIAGNOSIS — R300 Dysuria: Secondary | ICD-10-CM | POA: Diagnosis not present

## 2019-01-28 DIAGNOSIS — H6983 Other specified disorders of Eustachian tube, bilateral: Secondary | ICD-10-CM | POA: Diagnosis not present

## 2019-01-29 DIAGNOSIS — M25661 Stiffness of right knee, not elsewhere classified: Secondary | ICD-10-CM | POA: Diagnosis not present

## 2019-01-29 DIAGNOSIS — M1711 Unilateral primary osteoarthritis, right knee: Secondary | ICD-10-CM | POA: Diagnosis not present

## 2019-02-05 ENCOUNTER — Ambulatory Visit: Payer: Medicare Other | Admitting: Obstetrics & Gynecology

## 2019-02-05 DIAGNOSIS — M7130 Other bursal cyst, unspecified site: Secondary | ICD-10-CM | POA: Diagnosis not present

## 2019-02-05 DIAGNOSIS — L820 Inflamed seborrheic keratosis: Secondary | ICD-10-CM | POA: Diagnosis not present

## 2019-02-05 DIAGNOSIS — L821 Other seborrheic keratosis: Secondary | ICD-10-CM | POA: Diagnosis not present

## 2019-02-13 DIAGNOSIS — M25561 Pain in right knee: Secondary | ICD-10-CM | POA: Diagnosis not present

## 2019-02-13 DIAGNOSIS — M1711 Unilateral primary osteoarthritis, right knee: Secondary | ICD-10-CM | POA: Diagnosis not present

## 2019-02-13 NOTE — H&P (Signed)
TOTAL KNEE ADMISSION H&P  Patient is being admitted for right total knee arthroplasty.  Subjective:  Chief Complaint:right knee pain.  HPI: Jamie Cole, 78 y.o. female, has a history of pain and functional disability in the right knee due to arthritis and has failed non-surgical conservative treatments for greater than 12 weeks to includecorticosteriod injections and activity modification.  Onset of symptoms was gradual, starting several years ago with gradually worsening course since that time. The patient noted no past surgery on the right knee(s).  Patient currently rates pain in the right knee(s) at 5 out of 10 with activity. Patient has worsening of pain with activity and weight bearing, pain that interferes with activities of daily living, crepitus and joint swelling.  Patient has evidence of significant narrowing in the lateral compartment on the AP view. The lateral view shows bone-on-bone arthritis in the lateral and patellofemoral compartments by imaging studies. There is no active infection.  Patient Active Problem List   Diagnosis Date Noted  . Osteoporosis due to aromatase inhibitor 08/29/2016  . Shingles 04/17/2015  . Malignant neoplasm of upper-outer quadrant of right breast in female, estrogen receptor positive ([place]) 08/22/2013  . Hx of radiation therapy   . Acquired hypothyroidism 09/07/2011   Past Medical History:  Diagnosis Date  . Basal cell carcinoma of neck       . Breast cancer ([place]) 08/31/11   DCIS; right breast, ER/PR +  . Breast cancer ([place]) 2/13   9 MM R breast- E+/P+ HVR 2-  . Endometrial hyperplasia, simple   . Endometrial polyp   . Fracture    right arm  . H/O TB skin testing 1970's   + INH rx  . Hx of radiation therapy 10/18/11 to 11/18/11   right breast  . Hx of radiation therapy 1957   4 tx to face for acne  . Hypothyroidism   . Metacarpal bone fracture 08/30/2012   Right 5th finger fracture  . Osteoporosis   . Personal history of radiation  therapy     Past Surgical History:  Procedure Laterality Date  . BREAST LUMPECTOMY  2/13   sentinel node  . endometrial polyp    . HYSTEROSCOPY  12/00   polyps  . MASTECTOMY, PARTIAL  09/14/11   right breast  . TONSILLECTOMY     adenoids    No current facility-administered medications for this encounter.    Current Outpatient Medications  Medication Sig Dispense Refill Last Dose  . Ascorbic Acid (VITAMIN C) 1000 MG tablet Take 1,000 mg by mouth daily.   Taking  . calcium-vitamin D (SM CALCIUM 500/VITAMIN D3) 500-400 MG-UNIT per tablet Take 630 mg by mouth.   Taking  . denosumab (PROLIA) 60 MG/ML SOLN injection Inject 60 mg into the skin every 6 (six) months. Administer in upper arm, thigh, or abdomen   Taking  . levothyroxine (SYNTHROID) 112 MCG tablet Take 112 mcg by mouth daily before breakfast. Sunday and Wednesday 1/2 tab, 1 tablet all other days.   Taking  . magnesium oxide (MAG-OX) 400 (241.3 MG) MG tablet Take 1 tablet (400 mg total) by mouth daily.   Taking  . triamcinolone cream (KENALOG) 0.1 % APP EXT AA BID FOR 14 DAYS  0 Taking  . Vitamin D, Ergocalciferol, (DRISDOL) 1.25 MG (50000 UT) CAPS capsule Take 1 capsule (50,000 Units total) by mouth every 7 (seven) days. 12 capsule 4    Allergies  Allergen Reactions  . Other     PLEASE DO BLOOD  PRESSURE IN LEFT ARM ONLY    Social History   Tobacco Use  . Smoking status: Never Smoker  . Smokeless tobacco: Never Used  Substance Use Topics  . Alcohol use: No    Family History  Problem Relation Age of Onset  . Skin cancer Father 45  . Stroke Father   . Stomach cancer Paternal Grandmother 38  . Breast cancer Paternal Aunt        x 2  . Breast cancer Cousin        ages 93-60 at dx x 4 paternal  . Prostate cancer Cousin        ????  . Brain cancer Maternal Uncle        brain  . Cancer Cousin        tonsillar  . Stroke Brother   . Colon cancer Neg Hx      Review of Systems  Constitutional: Negative for chills  and fever.  HENT: Negative for congestion, sore throat and tinnitus.   Eyes: Negative for double vision, photophobia and pain.  Respiratory: Negative for cough, shortness of breath and wheezing.   Cardiovascular: Negative for chest pain, palpitations and orthopnea.  Gastrointestinal: Negative for heartburn, nausea and vomiting.  Genitourinary: Negative for dysuria, frequency and urgency.  Musculoskeletal: Positive for joint pain.  Neurological: Negative for dizziness, weakness and headaches.    Objective:  Physical Exam  Well nourished and well developed.  General: Alert and oriented x3, cooperative and pleasant, no acute distress.  Head: normocephalic, atraumatic, neck supple.  Eyes: EOMI.  Respiratory: breath sounds clear in all fields, no wheezing, rales, or rhonchi. Cardiovascular: Regular rate and rhythm, no murmurs, gallops or rubs.  Abdomen: non-tender to palpation and soft, normoactive bowel sounds. Musculoskeletal:  Right Knee Exam:  No effusion.  Valgus deformity is noted. Range of motion is 5-125 degrees.  Moderate crepitus on range of motion of the knee.  Positive lateral, greater than medial, joint line tenderness.  Stable knee.  Calves soft and nontender. Motor function intact in LE. Strength 5/5 LE bilaterally. Neuro: Distal pulses 2+. Sensation to light touch intact in LE.  Vital signs in last 24 hours: Blood pressure: 140/80 mmHg Pulse: 68 bpm  Labs:   Estimated body mass index is 32.42 kg/m as calculated from the following:   Height as of 11/14/17: 5\' 4"  (1.626 m).   Weight as of 08/09/18: 85.7 kg.   Imaging Review Plain radiographs demonstrate severe degenerative joint disease of the right knee(s). The overall alignment ismild valgus. The bone quality appears to be adequate for age and reported activity level.  Assessment/Plan:  End stage arthritis, right knee   The patient history, physical examination, clinical judgment of the provider and  imaging studies are consistent with end stage degenerative joint disease of the right knee(s) and total knee arthroplasty is deemed medically necessary. The treatment options including medical management, injection therapy arthroscopy and arthroplasty were discussed at length. The risks and benefits of total knee arthroplasty were presented and reviewed. The risks due to aseptic loosening, infection, stiffness, patella tracking problems, thromboembolic complications and other imponderables were discussed. The patient acknowledged the explanation, agreed to proceed with the plan and consent was signed. Patient is being admitted for inpatient treatment for surgery, pain control, PT, OT, prophylactic antibiotics, VTE prophylaxis, progressive ambulation and ADL's and discharge planning. The patient is planning to be discharged home.  Anticipated LOS equal to or greater than 2 midnights due to - Age 67 and older  with one or more of the following:  - Obesity  - Expected need for hospital services (PT, OT, Nursing) required for safe  discharge  - Anticipated need for postoperative skilled nursing care or inpatient rehab  - Active co-morbidities: None OR   - Unanticipated findings during/Post Surgery: None  - Patient is a high risk of re-admission due to: None  Therapy Plans: Outpatient therapy at [facility] Disposition: Home with friends Planned DVT Prophylaxis: Xarelto 10 mg daily (hx breast CA) DME needed: None PCP: Domenick Gong, MD TXA: IV Allergies: NKDA Anesthesia Concerns: None BMI: 32.3 Last HgbA1c: 5.6%  Other:  NO BP OR IV STICKS IN RIGHT ARM Bundle patient  - Patient was instructed on what medications to stop prior to surgery. - Follow-up visit in 2 weeks with Dr. Wynelle Link - Begin physical therapy following surgery - Pre-operative lab work as pre-surgical testing - Prescriptions will be provided in hospital at time of discharge  Theresa Duty, PA-C Orthopedic Surgery  [facility] [place]

## 2019-02-14 ENCOUNTER — Inpatient Hospital Stay: Payer: Medicare Other

## 2019-02-14 ENCOUNTER — Inpatient Hospital Stay: Payer: Medicare Other | Attending: Oncology

## 2019-02-14 ENCOUNTER — Ambulatory Visit: Payer: Medicare Other

## 2019-02-15 DIAGNOSIS — M8088XD Other osteoporosis with current pathological fracture, vertebra(e), subsequent encounter for fracture with routine healing: Secondary | ICD-10-CM | POA: Diagnosis not present

## 2019-02-15 DIAGNOSIS — M4316 Spondylolisthesis, lumbar region: Secondary | ICD-10-CM | POA: Diagnosis not present

## 2019-03-04 ENCOUNTER — Inpatient Hospital Stay: Admit: 2019-03-04 | Payer: Medicare Other | Admitting: Orthopedic Surgery

## 2019-03-04 SURGERY — ARTHROPLASTY, KNEE, TOTAL
Anesthesia: Choice | Laterality: Right

## 2019-03-07 DIAGNOSIS — M25661 Stiffness of right knee, not elsewhere classified: Secondary | ICD-10-CM | POA: Diagnosis not present

## 2019-03-07 DIAGNOSIS — M1711 Unilateral primary osteoarthritis, right knee: Secondary | ICD-10-CM | POA: Diagnosis not present

## 2019-03-07 DIAGNOSIS — M25561 Pain in right knee: Secondary | ICD-10-CM | POA: Diagnosis not present

## 2019-03-12 DIAGNOSIS — M1711 Unilateral primary osteoarthritis, right knee: Secondary | ICD-10-CM | POA: Diagnosis not present

## 2019-03-12 DIAGNOSIS — M25661 Stiffness of right knee, not elsewhere classified: Secondary | ICD-10-CM | POA: Diagnosis not present

## 2019-03-12 DIAGNOSIS — M25561 Pain in right knee: Secondary | ICD-10-CM | POA: Diagnosis not present

## 2019-03-14 DIAGNOSIS — M25661 Stiffness of right knee, not elsewhere classified: Secondary | ICD-10-CM | POA: Diagnosis not present

## 2019-03-14 DIAGNOSIS — M25561 Pain in right knee: Secondary | ICD-10-CM | POA: Diagnosis not present

## 2019-03-19 DIAGNOSIS — M25561 Pain in right knee: Secondary | ICD-10-CM | POA: Diagnosis not present

## 2019-03-19 DIAGNOSIS — M25661 Stiffness of right knee, not elsewhere classified: Secondary | ICD-10-CM | POA: Diagnosis not present

## 2019-03-19 DIAGNOSIS — M1711 Unilateral primary osteoarthritis, right knee: Secondary | ICD-10-CM | POA: Diagnosis not present

## 2019-03-21 DIAGNOSIS — M25661 Stiffness of right knee, not elsewhere classified: Secondary | ICD-10-CM | POA: Diagnosis not present

## 2019-03-21 DIAGNOSIS — M25561 Pain in right knee: Secondary | ICD-10-CM | POA: Diagnosis not present

## 2019-03-26 DIAGNOSIS — M25661 Stiffness of right knee, not elsewhere classified: Secondary | ICD-10-CM | POA: Diagnosis not present

## 2019-03-26 DIAGNOSIS — M25561 Pain in right knee: Secondary | ICD-10-CM | POA: Diagnosis not present

## 2019-03-28 DIAGNOSIS — M25661 Stiffness of right knee, not elsewhere classified: Secondary | ICD-10-CM | POA: Diagnosis not present

## 2019-03-28 DIAGNOSIS — M25561 Pain in right knee: Secondary | ICD-10-CM | POA: Diagnosis not present

## 2019-04-02 DIAGNOSIS — M25661 Stiffness of right knee, not elsewhere classified: Secondary | ICD-10-CM | POA: Diagnosis not present

## 2019-04-02 DIAGNOSIS — M25561 Pain in right knee: Secondary | ICD-10-CM | POA: Diagnosis not present

## 2019-04-10 DIAGNOSIS — M25561 Pain in right knee: Secondary | ICD-10-CM | POA: Diagnosis not present

## 2019-04-10 DIAGNOSIS — M25661 Stiffness of right knee, not elsewhere classified: Secondary | ICD-10-CM | POA: Diagnosis not present

## 2019-04-17 DIAGNOSIS — M25661 Stiffness of right knee, not elsewhere classified: Secondary | ICD-10-CM | POA: Diagnosis not present

## 2019-04-17 DIAGNOSIS — M25561 Pain in right knee: Secondary | ICD-10-CM | POA: Diagnosis not present

## 2019-06-11 DIAGNOSIS — M1711 Unilateral primary osteoarthritis, right knee: Secondary | ICD-10-CM | POA: Diagnosis not present

## 2019-06-11 DIAGNOSIS — M25561 Pain in right knee: Secondary | ICD-10-CM | POA: Diagnosis not present

## 2019-07-01 NOTE — H&P (Signed)
TOTAL KNEE ADMISSION H&P  Patient is being admitted for right total knee arthroplasty.  Subjective:  Chief Complaint:right knee pain.  HPI: Jamie Cole, 78 y.o. female, has a history of pain and functional disability in the right knee due to arthritis and has failed non-surgical conservative treatments for greater than 12 weeks to includecorticosteriod injections and activity modification.  Onset of symptoms was gradual, starting several years ago with gradually worsening course since that time. The patient noted no past surgery on the right knee(s).  Patient currently rates pain in the right knee(s) at 8 out of 10 with activity. Patient has worsening of pain with activity and weight bearing, crepitus and joint swelling.  Patient has evidence of significant narrowing in the lateral compartment on the AP view. The lateral view shows bone-on-bone arthritis in the lateral and patellofemoral compartments by imaging studies. There is no active infection.  Patient Active Problem List   Diagnosis Date Noted  . Osteoporosis due to aromatase inhibitor 08/29/2016  . Shingles 04/17/2015  . Malignant neoplasm of upper-outer quadrant of right breast in female, estrogen receptor positive ([place]) 08/22/2013  . Hx of radiation therapy   . Acquired hypothyroidism 09/07/2011   Past Medical History:  Diagnosis Date  . Basal cell carcinoma of neck       . Breast cancer ([place]) 08/31/11   DCIS; right breast, ER/PR +  . Breast cancer ([place]) 2/13   9 MM R breast- E+/P+ HVR 2-  . Endometrial hyperplasia, simple   . Endometrial polyp   . Fracture    right arm  . H/O TB skin testing 1970's   + INH rx  . Hx of radiation therapy 10/18/11 to 11/18/11   right breast  . Hx of radiation therapy 1957   4 tx to face for acne  . Hypothyroidism   . Metacarpal bone fracture 08/30/2012   Right 5th finger fracture  . Osteoporosis   . Personal history of radiation therapy     Past Surgical History:  Procedure  Laterality Date  . BREAST LUMPECTOMY  2/13   sentinel node  . endometrial polyp    . HYSTEROSCOPY  12/00   polyps  . MASTECTOMY, PARTIAL  09/14/11   right breast  . TONSILLECTOMY     adenoids    No current facility-administered medications for this encounter.    Current Outpatient Medications  Medication Sig Dispense Refill Last Dose  . acetaminophen (TYLENOL) 500 MG tablet Take 500 mg by mouth every 6 (six) hours as needed (for pain.).     Marland Kitchen ANUCORT-HC 25 MG suppository Place 25 mg rectally 2 (two) times daily as needed for hemorrhoids.     . Ascorbic Acid (VITAMIN C) 1000 MG tablet Take 1,000 mg by mouth daily.     . Calcium Carb-Cholecalciferol (CALCIUM 600+D3 PO) Take 1 tablet by mouth 3 (three) times daily.     Marland Kitchen levothyroxine (SYNTHROID) 112 MCG tablet Take 112 mcg by mouth See admin instructions. Take 0.5 tablet (56 mcg) by mouth on Sundays & Wednesdays in the morning, take 1 tablet (112 mcg) by mouth on all other days of the week.     . Magnesium Citrate 200 MG TABS Take 200 mg by mouth 2 (two) times daily.     Marland Kitchen RESVERATROL PO Take 500 mg by mouth daily.     . Vitamin D, Ergocalciferol, (DRISDOL) 1.25 MG (50000 UT) CAPS capsule Take 1 capsule (50,000 Units total) by mouth every 7 (seven) days. (Patient taking  differently: Take 50,000 Units by mouth every Tuesday. ) 12 capsule 4   . denosumab (PROLIA) 60 MG/ML SOLN injection Inject 60 mg into the skin every 6 (six) months. Administer in upper arm, thigh, or abdomen      Allergies  Allergen Reactions  . Other     PLEASE DO BLOOD PRESSURE IN LEFT ARM ONLY    Social History   Tobacco Use  . Smoking status: Never Smoker  . Smokeless tobacco: Never Used  Substance Use Topics  . Alcohol use: No    Family History  Problem Relation Age of Onset  . Skin cancer Father 84  . Stroke Father   . Stomach cancer Paternal Grandmother 64  . Breast cancer Paternal Aunt        x 2  . Breast cancer Cousin        ages 53-60 at dx x 4  paternal  . Prostate cancer Cousin        ????  . Brain cancer Maternal Uncle        brain  . Cancer Cousin        tonsillar  . Stroke Brother   . Colon cancer Neg Hx      Review of Systems  Constitutional: Negative for chills and fever.  HENT: Negative for congestion, sore throat and tinnitus.   Eyes: Negative for double vision, photophobia and pain.  Respiratory: Negative for cough, shortness of breath and wheezing.   Cardiovascular: Negative for chest pain, palpitations and orthopnea.  Gastrointestinal: Negative for heartburn, nausea and vomiting.  Genitourinary: Negative for dysuria, frequency and urgency.  Musculoskeletal: Positive for joint pain.  Neurological: Negative for dizziness, weakness and headaches.    Objective:  Physical Exam  Well nourished and well developed.  General: Alert and oriented x3, cooperative and pleasant, no acute distress.  Head: normocephalic, atraumatic, neck supple.  Eyes: EOMI.  Respiratory: breath sounds clear in all fields, no wheezing, rales, or rhonchi. Cardiovascular: Regular rate and rhythm, no murmurs, gallops or rubs.  Abdomen: non-tender to palpation and soft, normoactive bowel sounds. Musculoskeletal:  Right Knee Exam:  No effusion.  Valgus deformity is noted. Range of motion is 5-125 degrees.  Moderate crepitus on range of motion of the knee.  Positive lateral, greater than medial, joint line tenderness.  Stable knee.  Calves soft and nontender. Motor function intact in LE. Strength 5/5 LE bilaterally. Neuro: Distal pulses 2+. Sensation to light touch intact in LE.   Vital signs in last 24 hours: Blood pressure: 126/84 mmHg Pulse: 80 bpm  Labs:   Estimated body mass index is 32.42 kg/m as calculated from the following:   Height as of 11/14/17: 5\' 4"  (1.626 m).   Weight as of 08/09/18: 85.7 kg.   Imaging Review Plain radiographs demonstrate severe degenerative joint disease of the right knee(s). The overall  alignment issignificant valgus. The bone quality appears to be adequate for age and reported activity level.   Assessment/Plan:  End stage arthritis, right knee   The patient history, physical examination, clinical judgment of the provider and imaging studies are consistent with end stage degenerative joint disease of the right knee(s) and total knee arthroplasty is deemed medically necessary. The treatment options including medical management, injection therapy arthroscopy and arthroplasty were discussed at length. The risks and benefits of total knee arthroplasty were presented and reviewed. The risks due to aseptic loosening, infection, stiffness, patella tracking problems, thromboembolic complications and other imponderables were discussed. The patient acknowledged the explanation,  agreed to proceed with the plan and consent was signed. Patient is being admitted for inpatient treatment for surgery, pain control, PT, OT, prophylactic antibiotics, VTE prophylaxis, progressive ambulation and ADL's and discharge planning. The patient is planning to be discharged home.  Anticipated LOS equal to or greater than 2 midnights due to - Age 69 and older with one or more of the following:  - Obesity  - Expected need for hospital services (PT, OT, Nursing) required for safe  discharge  - Anticipated need for postoperative skilled nursing care or inpatient rehab  - Active co-morbidities: None OR   - Unanticipated findings during/Post Surgery: None  - Patient is a high risk of re-admission due to: None  Therapy Plans: Outpatient therapy [facility] Disposition: Home with friends Planned DVT Prophylaxis: Xarelto 10 mg daily (hx breast CA) DME needed: Gilford Rile PCP: Domenick Gong, MD TXA: IV Allergies: NKDA Anesthesia Concerns: None; hx compression fracture BMI: 32.3 Last HgbA1c: 5.6% Other: NO BP OR IV STICKS IN RIGHT ARM  - Patient was instructed on what medications to stop prior to surgery.  - Follow-up visit in 2 weeks with Dr. Wynelle Link - Begin physical therapy following surgery - Pre-operative lab work as pre-surgical testing - Prescriptions will be provided in hospital at time of discharge  Theresa Duty, PA-C Orthopedic Surgery [facility] [place]

## 2019-07-03 NOTE — Patient Instructions (Addendum)
DUE TO COVID-19 ONLY ONE VISITOR IS ALLOWED TO COME WITH YOU AND STAY IN THE WAITING ROOM ONLY DURING PRE OP AND PROCEDURE DAY OF SURGERY. THE 1 VISITOR MAY VISIT WITH YOU AFTER SURGERY IN YOUR PRIVATE ROOM DURING VISITING HOURS ONLY!  YOU NEED TO HAVE A COVID 19 TEST ON12/3_______ @__9 :30_____, THIS TEST MUST BE DONE BEFORE SURGERY, COME  [address], [place] , [address].  ([place]) ONCE YOUR COVID TEST IS COMPLETED, PLEASE BEGIN THE QUARANTINE INSTRUCTIONS AS OUTLINED IN YOUR HANDOUT.                [place]   Your procedure is scheduled on: 07/08/19   Report to [facility] Main  Entrance   Report to admitting at  5:50 AM     Call this number if you have problems the morning of surgery [place], [place].   Do not eat food After Midnight.   YOU MAY HAVE CLEAR LIQUIDS FROM MIDNIGHT UNTIL 5:00AM.   At 5:00 AM Please finish the prescribed Pre-Surgery  drink  . Nothing by mouth after you finish the  drink !   Take these medicines the morning of surgery with A SIP OF WATER: Levothyroxine                                 You may not have any metal on your body including hair pins and              piercings  Do not wear jewelry, make-up, lotions, powders or perfumes, deodorant             Do not wear nail polish on your fingernails.  Do not shave  48 hours prior to surgery.             Do not bring valuables to the hospital. [place].  Contacts, dentures or bridgework may not be worn into surgery.       Patients discharged the day of surgery will not be allowed to drive home.   IF YOU ARE HAVING SURGERY AND GOING HOME THE SAME DAY, YOU MUST HAVE AN ADULT TO DRIVE YOU HOME AND BE WITH YOU FOR 24 HOURS.   YOU MAY GO HOME BY TAXI OR UBER OR ORTHERWISE, BUT AN ADULT MUST ACCOMPANY YOU HOME AND STAY WITH  YOU FOR 24 HOURS.  Name and phone number of your driver:  Special Instructions: N/A              Please read over the following fact sheets you were given: _____________________________________________________________________             [facility] - Preparing for Surgery  Before surgery, you can play an important role.   Because skin is not sterile, your skin needs to be as free of germs as possible.   You can reduce the number of germs on your skin by washing with CHG (chlorahexidine gluconate) soap before surgery.   CHG is an antiseptic cleaner which kills germs and bonds with the skin to continue killing germs even after washing. Please DO NOT use if you have an allergy to CHG or antibacterial soaps.  If your skin becomes reddened/irritated  stop using the CHG and inform your nurse when you arrive at Short Stay. Do not shave (including legs and underarms) for at least 48 hours prior to the first CHG shower.    Please follow these instructions carefully:  1.  Shower with CHG Soap the night before surgery and the  morning of Surgery.  2.  If you choose to wash your hair, wash your hair first as usual with your  normal  shampoo.  3.  After you shampoo, rinse your hair and body thoroughly to remove the  shampoo.                                        4.  Use CHG as you would any other liquid soap.  You can apply chg directly  to the skin and wash                       Gently with a scrungie or clean washcloth.  5.  Apply the CHG Soap to your body ONLY FROM THE NECK DOWN.   Do not use on face/ open                           Wound or open sores. Avoid contact with eyes, ears mouth and genitals (private parts).                       Wash face,  Genitals (private parts) with your normal soap.             6.  Wash thoroughly, paying special attention to the area where your surgery  will be performed.  7.  Thoroughly rinse your body with warm water from the neck down.  8.  DO NOT shower/wash  with your normal soap after using and rinsing off  the CHG Soap.             9.  Pat yourself dry with a clean towel.            10.  Wear clean pajamas.            11.  Place clean sheets on your bed the night of your first shower and do not  sleep with pets. Day of Surgery : Do not apply any lotions/deodorants the morning of surgery.  Please wear clean clothes to the hospital/surgery center.  FAILURE TO FOLLOW THESE INSTRUCTIONS MAY RESULT IN THE CANCELLATION OF YOUR SURGERY PATIENT SIGNATURE_________________________________  NURSE SIGNATURE__________________________________  ___  Incentive Spirometer  An incentive spirometer is a tool that can help keep your lungs clear and active. This tool measures how well you are filling your lungs with each breath. Taking long deep breaths may help reverse or decrease the chance of developing breathing (pulmonary) problems (especially infection) following:  A long period of time when you are unable to move or be active. BEFORE THE PROCEDURE   If the spirometer includes an indicator to show your best effort, your nurse or respiratory therapist will set it to a desired goal.  If possible, sit up straight or lean slightly forward. Try not to slouch.  Hold the incentive spirometer in an upright position. INSTRUCTIONS FOR USE  1. Sit on the edge of your bed if possible, or sit up as far as you can in bed or on  a chair. 2. Hold the incentive spirometer in an upright position. 3. Breathe out normally. 4. Place the mouthpiece in your mouth and seal your lips tightly around it. 5. Breathe in slowly and as deeply as possible, raising the piston or the ball toward the top of the column. 6. Hold your breath for 3-5 seconds or for as long as possible. Allow the piston or ball to fall to the bottom of the column. 7. Remove the mouthpiece from your mouth and breathe out normally. 8. Rest for a few seconds and repeat Steps 1 through 7 at least 10 times  every 1-2 hours when you are awake. Take your time and take a few normal breaths between deep breaths. 9. The spirometer may include an indicator to show your best effort. Use the indicator as a goal to work toward during each repetition. 10. After each set of 10 deep breaths, practice coughing to be sure your lungs are clear. If you have an incision (the cut made at the time of surgery), support your incision when coughing by placing a pillow or rolled up towels firmly against it. Once you are able to get out of bed, walk around indoors and cough well. You may stop using the incentive spirometer when instructed by your caregiver.  RISKS AND COMPLICATIONS  Take your time so you do not get dizzy or light-headed.  If you are in pain, you may need to take or ask for pain medication before doing incentive spirometry. It is harder to take a deep breath if you are having pain. AFTER USE  Rest and breathe slowly and easily.  It can be helpful to keep track of a log of your progress. Your caregiver can provide you with a simple table to help with this. If you are using the spirometer at home, follow these instructions: [place] IF:   You are having difficultly using the spirometer.  You have trouble using the spirometer as often as instructed.  Your pain medication is not giving enough relief while using the spirometer.  You develop fever of 100.5 F (38.1 C) or higher. SEEK IMMEDIATE MEDICAL CARE IF:   You cough up bloody sputum that had not been present before.  You develop fever of 102 F (38.9 C) or greater.  You develop worsening pain at or near the incision site. MAKE SURE YOU:   Understand these instructions.  Will watch your condition.  Will get help right away if you are not doing well or get worse. Document Released: 11/28/2006 Document Revised: 10/10/2011 Document Reviewed: 01/29/2007 [facility] Patient Information 2014 Exit[employer],  [place].   ________________________________________________________________________ _____________________________________________________________________

## 2019-07-04 ENCOUNTER — Encounter (INDEPENDENT_AMBULATORY_CARE_PROVIDER_SITE_OTHER): Payer: Self-pay

## 2019-07-04 ENCOUNTER — Other Ambulatory Visit: Payer: Self-pay

## 2019-07-04 ENCOUNTER — Encounter (HOSPITAL_COMMUNITY)
Admission: RE | Admit: 2019-07-04 | Discharge: 2019-07-04 | Disposition: A | Discharge status: Home / Self Care | Payer: Medicare Other | Source: Ambulatory Visit | Attending: Orthopedic Surgery | Admitting: Orthopedic Surgery

## 2019-07-04 ENCOUNTER — Encounter (HOSPITAL_COMMUNITY): Payer: Self-pay

## 2019-07-04 ENCOUNTER — Other Ambulatory Visit (HOSPITAL_COMMUNITY)
Admission: RE | Admit: 2019-07-04 | Discharge: 2019-07-04 | Disposition: A | Discharge status: Home / Self Care | Payer: Medicare Other | Source: Ambulatory Visit | Attending: Orthopedic Surgery | Admitting: Orthopedic Surgery

## 2019-07-04 DIAGNOSIS — Z01818 Encounter for other preprocedural examination: Secondary | ICD-10-CM | POA: Diagnosis not present

## 2019-07-04 DIAGNOSIS — E039 Hypothyroidism, unspecified: Secondary | ICD-10-CM | POA: Diagnosis not present

## 2019-07-04 DIAGNOSIS — E7800 Pure hypercholesterolemia, unspecified: Secondary | ICD-10-CM | POA: Diagnosis not present

## 2019-07-04 DIAGNOSIS — Z01812 Encounter for preprocedural laboratory examination: Secondary | ICD-10-CM | POA: Insufficient documentation

## 2019-07-04 DIAGNOSIS — Z20828 Contact with and (suspected) exposure to other viral communicable diseases: Secondary | ICD-10-CM | POA: Insufficient documentation

## 2019-07-04 DIAGNOSIS — E669 Obesity, unspecified: Secondary | ICD-10-CM | POA: Diagnosis not present

## 2019-07-04 DIAGNOSIS — M1711 Unilateral primary osteoarthritis, right knee: Secondary | ICD-10-CM | POA: Diagnosis not present

## 2019-07-04 DIAGNOSIS — R7302 Impaired glucose tolerance (oral): Secondary | ICD-10-CM | POA: Diagnosis not present

## 2019-07-04 DIAGNOSIS — M818 Other osteoporosis without current pathological fracture: Secondary | ICD-10-CM | POA: Diagnosis not present

## 2019-07-04 DIAGNOSIS — M179 Osteoarthritis of knee, unspecified: Secondary | ICD-10-CM | POA: Diagnosis not present

## 2019-07-04 DIAGNOSIS — E559 Vitamin D deficiency, unspecified: Secondary | ICD-10-CM | POA: Diagnosis not present

## 2019-07-04 HISTORY — DX: Other cervical disc displacement, unspecified cervical region: M50.20

## 2019-07-04 LAB — CBC
HCT: 45.2 % (ref 36.0–46.0)
Hemoglobin: 14.8 g/dL (ref 12.0–15.0)
MCH: 31.8 pg (ref 26.0–34.0)
MCHC: 32.7 g/dL (ref 30.0–36.0)
MCV: 97 fL (ref 80.0–100.0)
Platelets: 253 10*3/uL (ref 150–400)
RBC: 4.66 MIL/uL (ref 3.87–5.11)
RDW: 13 % (ref 11.5–15.5)
WBC: 5.3 10*3/uL (ref 4.0–10.5)
nRBC: 0 % (ref 0.0–0.2)

## 2019-07-04 LAB — COMPREHENSIVE METABOLIC PANEL
ALT: 13 U/L (ref 0–44)
AST: 17 U/L (ref 15–41)
Albumin: 4.6 g/dL (ref 3.5–5.0)
Alkaline Phosphatase: 75 U/L (ref 38–126)
Anion gap: 12 (ref 5–15)
BUN: 18 mg/dL (ref 8–23)
CO2: 21 mmol/L — ABNORMAL LOW (ref 22–32)
Calcium: 9.6 mg/dL (ref 8.9–10.3)
Chloride: 106 mmol/L (ref 98–111)
Creatinine, Ser: 0.61 mg/dL (ref 0.44–1.00)
GFR calc Af Amer: >60 mL/min (ref >60)
GFR calc non Af Amer: >60 mL/min (ref >60)
Glucose, Bld: 112 mg/dL — ABNORMAL HIGH (ref 70–99)
Potassium: 3.9 mmol/L (ref 3.5–5.1)
Sodium: 139 mmol/L (ref 135–145)
Total Bilirubin: 0.8 mg/dL (ref 0.3–1.2)
Total Protein: 6.8 g/dL (ref 6.5–8.1)

## 2019-07-04 LAB — ABO/RH: ABO/RH(D): O POS

## 2019-07-04 LAB — PROTIME-INR
INR: 0.9 (ref 0.8–1.2)
Prothrombin Time: 12.4 seconds (ref 11.4–15.2)

## 2019-07-04 LAB — HEMOGLOBIN A1C
Hgb A1c MFr Bld: 5.8 % — ABNORMAL HIGH (ref 4.8–5.6)
Mean Plasma Glucose: 119.76 mg/dL

## 2019-07-04 LAB — SURGICAL PCR SCREEN
MRSA, PCR: NEGATIVE
Staphylococcus aureus: NEGATIVE

## 2019-07-04 LAB — APTT: aPTT: 34 seconds (ref 24–36)

## 2019-07-04 NOTE — Progress Notes (Addendum)
PCP - Dr. Alfonso Patten. [place] Cardiologist -none   Chest x-ray - no EKG - no Stress Test - no ECHO - no Cardiac Cath - no  Sleep Study - no CPAP -   Fasting Blood Sugar - NA Checks Blood Sugar _____ times a day  Blood Thinner Instructions:NA Aspirin Instructions: Last Dose:  Anesthesia review:   Patient denies shortness of breath, fever, cough and chest pain at PAT appointment  yes Patient verbalized understanding of instructions that were given to them at the PAT appointment. Patient was also instructed that they will need to review over the PAT instructions again at home before surgery. yes

## 2019-07-07 LAB — NOVEL CORONAVIRUS, NAA (HOSP ORDER, SEND-OUT TO REF LAB; TAT 18-24 HRS): SARS-CoV-2, NAA: NOT DETECTED

## 2019-07-07 MED ORDER — BUPIVACAINE LIPOSOME 1.3 % IJ SUSP
20.0000 mL | Freq: Once | INTRAMUSCULAR | Status: DC
  Filled 2019-07-07: qty 20

## 2019-07-08 ENCOUNTER — Encounter (HOSPITAL_COMMUNITY): Payer: Self-pay | Admitting: *Deleted

## 2019-07-08 ENCOUNTER — Encounter (HOSPITAL_COMMUNITY)
Admission: RE | Disposition: A | Discharge status: Home / Self Care | Payer: Self-pay | Source: Home / Self Care | Attending: Orthopedic Surgery

## 2019-07-08 ENCOUNTER — Other Ambulatory Visit: Payer: Self-pay

## 2019-07-08 ENCOUNTER — Observation Stay (HOSPITAL_COMMUNITY)
Admission: RE | Admit: 2019-07-08 | Discharge: 2019-07-09 | Disposition: A | Discharge status: Home / Self Care | Payer: Medicare Other | Attending: Orthopedic Surgery | Admitting: Orthopedic Surgery

## 2019-07-08 ENCOUNTER — Inpatient Hospital Stay (HOSPITAL_COMMUNITY): Payer: Medicare Other | Admitting: Physician Assistant

## 2019-07-08 ENCOUNTER — Inpatient Hospital Stay (HOSPITAL_COMMUNITY): Payer: Medicare Other | Admitting: Certified Registered"

## 2019-07-08 DIAGNOSIS — Z923 Personal history of irradiation: Secondary | ICD-10-CM | POA: Diagnosis not present

## 2019-07-08 DIAGNOSIS — T50995A Adverse effect of other drugs, medicaments and biological substances, initial encounter: Secondary | ICD-10-CM | POA: Diagnosis not present

## 2019-07-08 DIAGNOSIS — Z853 Personal history of malignant neoplasm of breast: Secondary | ICD-10-CM | POA: Diagnosis not present

## 2019-07-08 DIAGNOSIS — M1711 Unilateral primary osteoarthritis, right knee: Principal | ICD-10-CM | POA: Insufficient documentation

## 2019-07-08 DIAGNOSIS — Z79890 Hormone replacement therapy (postmenopausal): Secondary | ICD-10-CM | POA: Diagnosis not present

## 2019-07-08 DIAGNOSIS — G8918 Other acute postprocedural pain: Secondary | ICD-10-CM | POA: Diagnosis not present

## 2019-07-08 DIAGNOSIS — E669 Obesity, unspecified: Secondary | ICD-10-CM | POA: Diagnosis not present

## 2019-07-08 DIAGNOSIS — E039 Hypothyroidism, unspecified: Secondary | ICD-10-CM | POA: Diagnosis not present

## 2019-07-08 DIAGNOSIS — Z79899 Other long term (current) drug therapy: Secondary | ICD-10-CM | POA: Insufficient documentation

## 2019-07-08 DIAGNOSIS — Z85828 Personal history of other malignant neoplasm of skin: Secondary | ICD-10-CM | POA: Diagnosis not present

## 2019-07-08 DIAGNOSIS — M1710 Unilateral primary osteoarthritis, unspecified knee: Secondary | ICD-10-CM | POA: Diagnosis present

## 2019-07-08 DIAGNOSIS — M818 Other osteoporosis without current pathological fracture: Secondary | ICD-10-CM | POA: Insufficient documentation

## 2019-07-08 DIAGNOSIS — M179 Osteoarthritis of knee, unspecified: Secondary | ICD-10-CM | POA: Diagnosis present

## 2019-07-08 DIAGNOSIS — Z6828 Body mass index (BMI) 28.0-28.9, adult: Secondary | ICD-10-CM | POA: Diagnosis not present

## 2019-07-08 HISTORY — PX: TOTAL KNEE ARTHROPLASTY: SHX125

## 2019-07-08 LAB — TYPE AND SCREEN
ABO/RH(D): O POS
Antibody Screen: NEGATIVE

## 2019-07-08 SURGERY — ARTHROPLASTY, KNEE, TOTAL
Anesthesia: Regional | Site: Knee | Laterality: Right

## 2019-07-08 MED ORDER — PHENOL 1.4 % MT LIQD: 1.0000 | OROMUCOSAL | Status: DC | PRN

## 2019-07-08 MED ORDER — PROPOFOL 10 MG/ML IV BOLUS
INTRAVENOUS | Status: AC
  Filled 2019-07-08: qty 20

## 2019-07-08 MED ORDER — FENTANYL CITRATE (PF) 100 MCG/2ML IJ SOLN
INTRAMUSCULAR | Status: AC
  Filled 2019-07-08: qty 2

## 2019-07-08 MED ORDER — PROPOFOL 10 MG/ML IV BOLUS
INTRAVENOUS | Status: DC | PRN
  Administered 2019-07-08 (×3): 20 mg via INTRAVENOUS

## 2019-07-08 MED ORDER — BUPIVACAINE IN DEXTROSE 0.75-8.25 % IT SOLN
INTRATHECAL | Status: DC | PRN
  Administered 2019-07-08: 1.6 mL via INTRATHECAL

## 2019-07-08 MED ORDER — SODIUM CHLORIDE 0.9 % IR SOLN
Status: DC | PRN
  Administered 2019-07-08: 1000 mL

## 2019-07-08 MED ORDER — ONDANSETRON HCL 4 MG/2ML IJ SOLN: 4.0000 mg | Freq: Four times a day (QID) | INTRAMUSCULAR | Status: DC | PRN

## 2019-07-08 MED ORDER — TRANEXAMIC ACID-NACL 1000-0.7 MG/100ML-% IV SOLN
1000.0000 mg | INTRAVENOUS | Status: AC
  Administered 2019-07-08: 10:00:00 1000 mg via INTRAVENOUS
  Filled 2019-07-08: qty 100

## 2019-07-08 MED ORDER — CEFAZOLIN SODIUM-DEXTROSE 1-4 GM/50ML-% IV SOLN
1.0000 g | Freq: Four times a day (QID) | INTRAVENOUS | Status: AC
  Administered 2019-07-08 – 2019-07-09 (×2): 1 g via INTRAVENOUS
  Filled 2019-07-08 (×2): qty 50

## 2019-07-08 MED ORDER — OXYCODONE HCL 5 MG PO TABS
5.0000 mg | ORAL_TABLET | ORAL | Status: DC | PRN
  Administered 2019-07-08 – 2019-07-09 (×4): 5 mg via ORAL
  Filled 2019-07-08 (×4): qty 1

## 2019-07-08 MED ORDER — DIPHENHYDRAMINE HCL 12.5 MG/5ML PO ELIX: 12.5000 mg | ORAL_SOLUTION | ORAL | Status: DC | PRN

## 2019-07-08 MED ORDER — POVIDONE-IODINE 10 % EX SWAB: 2.0000 "application " | Freq: Once | CUTANEOUS | Status: DC

## 2019-07-08 MED ORDER — METOCLOPRAMIDE HCL 5 MG PO TABS: 5.0000 mg | ORAL_TABLET | Freq: Three times a day (TID) | ORAL | Status: DC | PRN

## 2019-07-08 MED ORDER — EPHEDRINE SULFATE-NACL 50-0.9 MG/10ML-% IV SOSY
PREFILLED_SYRINGE | INTRAVENOUS | Status: DC | PRN
  Administered 2019-07-08 (×4): 5 mg via INTRAVENOUS
  Administered 2019-07-08: 10 mg via INTRAVENOUS
  Administered 2019-07-08 (×2): 5 mg via INTRAVENOUS
  Administered 2019-07-08: 10 mg via INTRAVENOUS

## 2019-07-08 MED ORDER — ACETAMINOPHEN 10 MG/ML IV SOLN
1000.0000 mg | Freq: Four times a day (QID) | INTRAVENOUS | Status: DC
  Administered 2019-07-08: 1000 mg via INTRAVENOUS
  Filled 2019-07-08: qty 100

## 2019-07-08 MED ORDER — LEVOTHYROXINE SODIUM 112 MCG PO TABS
112.0000 ug | ORAL_TABLET | ORAL | Status: DC
  Administered 2019-07-09: 07:00:00 112 ug via ORAL
  Filled 2019-07-08: qty 1

## 2019-07-08 MED ORDER — PHENYLEPHRINE 40 MCG/ML (10ML) SYRINGE FOR IV PUSH (FOR BLOOD PRESSURE SUPPORT)
PREFILLED_SYRINGE | INTRAVENOUS | Status: DC | PRN
  Administered 2019-07-08: 40 ug via INTRAVENOUS
  Administered 2019-07-08: 80 ug via INTRAVENOUS

## 2019-07-08 MED ORDER — FENTANYL CITRATE (PF) 100 MCG/2ML IJ SOLN
50.0000 ug | Freq: Once | INTRAMUSCULAR | Status: AC
  Administered 2019-07-08: 100 ug via INTRAVENOUS
  Filled 2019-07-08: qty 2

## 2019-07-08 MED ORDER — ONDANSETRON HCL 4 MG/2ML IJ SOLN
INTRAMUSCULAR | Status: AC
  Filled 2019-07-08: qty 2

## 2019-07-08 MED ORDER — PROPOFOL 500 MG/50ML IV EMUL
INTRAVENOUS | Status: DC | PRN
  Administered 2019-07-08: 75 ug/kg/min via INTRAVENOUS

## 2019-07-08 MED ORDER — DEXAMETHASONE SODIUM PHOSPHATE 10 MG/ML IJ SOLN
10.0000 mg | Freq: Once | INTRAMUSCULAR | Status: AC
  Administered 2019-07-09: 09:00:00 10 mg via INTRAVENOUS
  Filled 2019-07-08: qty 1

## 2019-07-08 MED ORDER — FENTANYL CITRATE (PF) 100 MCG/2ML IJ SOLN: 25.0000 ug | INTRAMUSCULAR | Status: DC | PRN

## 2019-07-08 MED ORDER — ASPIRIN EC 325 MG PO TBEC: 325.0000 mg | DELAYED_RELEASE_TABLET | Freq: Two times a day (BID) | ORAL | Status: DC

## 2019-07-08 MED ORDER — DEXAMETHASONE SODIUM PHOSPHATE 10 MG/ML IJ SOLN: 8.0000 mg | Freq: Once | INTRAMUSCULAR | Status: DC

## 2019-07-08 MED ORDER — ONDANSETRON HCL 4 MG/2ML IJ SOLN: 4.0000 mg | Freq: Once | INTRAMUSCULAR | Status: DC | PRN

## 2019-07-08 MED ORDER — FLEET ENEMA 7-19 GM/118ML RE ENEM: 1.0000 | ENEMA | Freq: Once | RECTAL | Status: DC | PRN

## 2019-07-08 MED ORDER — POLYETHYLENE GLYCOL 3350 17 G PO PACK: 17.0000 g | PACK | Freq: Every day | ORAL | Status: DC | PRN

## 2019-07-08 MED ORDER — METHOCARBAMOL 500 MG PO TABS
500.0000 mg | ORAL_TABLET | Freq: Four times a day (QID) | ORAL | Status: DC | PRN
  Administered 2019-07-08 – 2019-07-09 (×3): 500 mg via ORAL
  Filled 2019-07-08 (×3): qty 1

## 2019-07-08 MED ORDER — LACTATED RINGERS IV SOLN
INTRAVENOUS | Status: DC
  Administered 2019-07-08: 10:00:00 via INTRAVENOUS

## 2019-07-08 MED ORDER — MORPHINE SULFATE (PF) 4 MG/ML IV SOLN: 1.0000 mg | INTRAVENOUS | Status: DC | PRN

## 2019-07-08 MED ORDER — SODIUM CHLORIDE (PF) 0.9 % IJ SOLN
INTRAMUSCULAR | Status: DC | PRN
  Administered 2019-07-08: 30 mL

## 2019-07-08 MED ORDER — PROPOFOL 500 MG/50ML IV EMUL
INTRAVENOUS | Status: AC
  Filled 2019-07-08: qty 50

## 2019-07-08 MED ORDER — TRANEXAMIC ACID-NACL 1000-0.7 MG/100ML-% IV SOLN
INTRAVENOUS | Status: DC | PRN
  Administered 2019-07-08: 1000 mg via INTRAVENOUS

## 2019-07-08 MED ORDER — TRAMADOL HCL 50 MG PO TABS: 50.0000 mg | ORAL_TABLET | Freq: Four times a day (QID) | ORAL | Status: DC | PRN

## 2019-07-08 MED ORDER — CEFAZOLIN SODIUM-DEXTROSE 2-4 GM/100ML-% IV SOLN
2.0000 g | INTRAVENOUS | Status: AC
  Administered 2019-07-08: 2 g via INTRAVENOUS
  Filled 2019-07-08: qty 100

## 2019-07-08 MED ORDER — RIVAROXABAN 10 MG PO TABS
10.0000 mg | ORAL_TABLET | Freq: Every day | ORAL | Status: DC
  Administered 2019-07-09: 09:00:00 10 mg via ORAL
  Filled 2019-07-08: qty 1

## 2019-07-08 MED ORDER — LEVOTHYROXINE SODIUM 112 MCG PO TABS: 56.0000 ug | ORAL_TABLET | ORAL | Status: DC

## 2019-07-08 MED ORDER — SODIUM CHLORIDE (PF) 0.9 % IJ SOLN
INTRAMUSCULAR | Status: AC
  Filled 2019-07-08: qty 50

## 2019-07-08 MED ORDER — BISACODYL 10 MG RE SUPP: 10.0000 mg | Freq: Every day | RECTAL | Status: DC | PRN

## 2019-07-08 MED ORDER — ROPIVACAINE HCL 5 MG/ML IJ SOLN
INTRAMUSCULAR | Status: DC | PRN
  Administered 2019-07-08: 30 mL via PERINEURAL

## 2019-07-08 MED ORDER — HYDROCORTISONE ACETATE 25 MG RE SUPP: 25.0000 mg | Freq: Two times a day (BID) | RECTAL | Status: DC | PRN

## 2019-07-08 MED ORDER — MENTHOL 3 MG MT LOZG: 1.0000 | LOZENGE | OROMUCOSAL | Status: DC | PRN

## 2019-07-08 MED ORDER — ACETAMINOPHEN 500 MG PO TABS
1000.0000 mg | ORAL_TABLET | Freq: Four times a day (QID) | ORAL | Status: AC
  Administered 2019-07-08 – 2019-07-09 (×4): 1000 mg via ORAL
  Filled 2019-07-08 (×5): qty 2

## 2019-07-08 MED ORDER — SODIUM CHLORIDE 0.9 % IV SOLN
INTRAVENOUS | Status: DC
  Administered 2019-07-08: 15:00:00 via INTRAVENOUS

## 2019-07-08 MED ORDER — GABAPENTIN 100 MG PO CAPS
100.0000 mg | ORAL_CAPSULE | Freq: Three times a day (TID) | ORAL | Status: DC
  Administered 2019-07-08 – 2019-07-09 (×3): 100 mg via ORAL
  Filled 2019-07-08 (×3): qty 1

## 2019-07-08 MED ORDER — DEXAMETHASONE SODIUM PHOSPHATE 10 MG/ML IJ SOLN
INTRAMUSCULAR | Status: AC
  Filled 2019-07-08: qty 1

## 2019-07-08 MED ORDER — CHLORHEXIDINE GLUCONATE 4 % EX LIQD: 60.0000 mL | Freq: Once | CUTANEOUS | Status: DC

## 2019-07-08 MED ORDER — DOCUSATE SODIUM 100 MG PO CAPS
100.0000 mg | ORAL_CAPSULE | Freq: Two times a day (BID) | ORAL | Status: DC
  Administered 2019-07-08 – 2019-07-09 (×2): 100 mg via ORAL
  Filled 2019-07-08 (×2): qty 1

## 2019-07-08 MED ORDER — MIDAZOLAM HCL 2 MG/2ML IJ SOLN
1.0000 mg | Freq: Once | INTRAMUSCULAR | Status: DC
  Filled 2019-07-08: qty 2

## 2019-07-08 MED ORDER — STERILE WATER FOR IRRIGATION IR SOLN
Status: DC | PRN
  Administered 2019-07-08: 2000 mL

## 2019-07-08 MED ORDER — METOCLOPRAMIDE HCL 5 MG/ML IJ SOLN: 5.0000 mg | Freq: Three times a day (TID) | INTRAMUSCULAR | Status: DC | PRN

## 2019-07-08 MED ORDER — ONDANSETRON HCL 4 MG PO TABS: 4.0000 mg | ORAL_TABLET | Freq: Four times a day (QID) | ORAL | Status: DC | PRN

## 2019-07-08 MED ORDER — BUPIVACAINE LIPOSOME 1.3 % IJ SUSP
INTRAMUSCULAR | Status: DC | PRN
  Administered 2019-07-08: 20 mL

## 2019-07-08 MED ORDER — METHOCARBAMOL 500 MG IVPB - SIMPLE MED
500.0000 mg | Freq: Four times a day (QID) | INTRAVENOUS | Status: DC | PRN
  Filled 2019-07-08: qty 50

## 2019-07-08 SURGICAL SUPPLY — 62 items
ATTUNE PSFEM RTSZ5 NARCEM KNEE (Femur) ×2 IMPLANT
ATTUNE PSRP INSR SZ5 8 KNEE (Insert) ×1 IMPLANT
ATTUNE PSRP INSR SZ5 8MM KNEE (Insert) ×1 IMPLANT
BAG SPEC THK2 15X12 ZIP CLS (MISCELLANEOUS) ×1
BAG ZIPLOCK 12X15 (MISCELLANEOUS) ×3 IMPLANT
BASE TIBIAL ROT PLAT SZ 5 KNEE (Knees) IMPLANT
BLADE SAG 18X100X1.27 (BLADE) ×3 IMPLANT
BLADE SAW SGTL 11.0X1.19X90.0M (BLADE) ×3 IMPLANT
BLADE SURG SZ10 CARB STEEL (BLADE) ×6 IMPLANT
BNDG ELASTIC 6X5.8 VLCR STR LF (GAUZE/BANDAGES/DRESSINGS) ×2 IMPLANT
BOWL SMART MIX CTS (DISPOSABLE) ×3 IMPLANT
BSPLAT TIB 5 CMNT ROT PLAT STR (Knees) ×1 IMPLANT
CEMENT HV SMART SET (Cement) ×6 IMPLANT
CLOSURE WOUND 1/2 X4 (GAUZE/BANDAGES/DRESSINGS) ×1
COVER SURGICAL LIGHT HANDLE (MISCELLANEOUS) ×3 IMPLANT
COVER WAND RF STERILE (DRAPES) IMPLANT
CUFF TOURN SGL QUICK 34 (TOURNIQUET CUFF) ×3
CUFF TRNQT CYL 34X4.125X (TOURNIQUET CUFF) ×1 IMPLANT
DECANTER SPIKE VIAL GLASS SM (MISCELLANEOUS) ×3 IMPLANT
DRAPE U-SHAPE 47X51 STRL (DRAPES) ×3 IMPLANT
DRSG ADAPTIC 3X8 NADH LF (GAUZE/BANDAGES/DRESSINGS) ×3 IMPLANT
DRSG PAD ABDOMINAL 8X10 ST (GAUZE/BANDAGES/DRESSINGS) ×3 IMPLANT
DURAPREP 26ML APPLICATOR (WOUND CARE) ×3 IMPLANT
ELECT REM PT RETURN 15FT ADLT (MISCELLANEOUS) ×3 IMPLANT
EVACUATOR 1/8 PVC DRAIN (DRAIN) ×3 IMPLANT
GAUZE SPONGE 4X4 12PLY STRL (GAUZE/BANDAGES/DRESSINGS) ×3 IMPLANT
GLOVE BIO SURGEON STRL SZ7 (GLOVE) ×3 IMPLANT
GLOVE BIO SURGEON STRL SZ8 (GLOVE) ×3 IMPLANT
GLOVE BIOGEL PI IND STRL 6.5 (GLOVE) ×1 IMPLANT
GLOVE BIOGEL PI IND STRL 7.0 (GLOVE) ×1 IMPLANT
GLOVE BIOGEL PI IND STRL 8 (GLOVE) ×1 IMPLANT
GLOVE BIOGEL PI INDICATOR 6.5 (GLOVE) ×2
GLOVE BIOGEL PI INDICATOR 7.0 (GLOVE) ×2
GLOVE BIOGEL PI INDICATOR 8 (GLOVE) ×2
GLOVE SURG SS PI 6.5 STRL IVOR (GLOVE) ×3 IMPLANT
GOWN STRL REUS W/TWL LRG LVL3 (GOWN DISPOSABLE) ×9 IMPLANT
HANDPIECE INTERPULSE COAX TIP (DISPOSABLE) ×3
HOLDER FOLEY CATH W/STRAP (MISCELLANEOUS) IMPLANT
IMMOBILIZER KNEE 20 (SOFTGOODS) ×3
IMMOBILIZER KNEE 20 THIGH 36 (SOFTGOODS) ×1 IMPLANT
KIT TURNOVER KIT A (KITS) IMPLANT
MANIFOLD NEPTUNE II (INSTRUMENTS) ×3 IMPLANT
NS IRRIG 1000ML POUR BTL (IV SOLUTION) ×3 IMPLANT
PACK TOTAL KNEE CUSTOM (KITS) ×3 IMPLANT
PADDING CAST COTTON 6X4 STRL (CAST SUPPLIES) ×5 IMPLANT
PATELLA MEDIAL ATTUN 35MM KNEE (Knees) ×2 IMPLANT
PENCIL SMOKE EVACUATOR (MISCELLANEOUS) IMPLANT
PIN DRILL FIX HALF THREAD (BIT) ×2 IMPLANT
PIN STEINMAN FIXATION KNEE (PIN) ×2 IMPLANT
PROTECTOR NERVE ULNAR (MISCELLANEOUS) ×3 IMPLANT
SET HNDPC FAN SPRY TIP SCT (DISPOSABLE) ×1 IMPLANT
STRIP CLOSURE SKIN 1/2X4 (GAUZE/BANDAGES/DRESSINGS) ×3 IMPLANT
SUT MNCRL AB 4-0 PS2 18 (SUTURE) ×3 IMPLANT
SUT STRATAFIX 0 PDS 27 VIOLET (SUTURE) ×3
SUT VIC AB 2-0 CT1 27 (SUTURE) ×9
SUT VIC AB 2-0 CT1 TAPERPNT 27 (SUTURE) ×3 IMPLANT
SUTURE STRATFX 0 PDS 27 VIOLET (SUTURE) ×1 IMPLANT
TIBIAL BASE ROT PLAT SZ 5 KNEE (Knees) ×3 IMPLANT
TRAY FOLEY MTR SLVR 16FR STAT (SET/KITS/TRAYS/PACK) ×3 IMPLANT
WATER STERILE IRR 1000ML POUR (IV SOLUTION) ×6 IMPLANT
WRAP KNEE MAXI GEL POST OP (GAUZE/BANDAGES/DRESSINGS) ×3 IMPLANT
YANKAUER SUCT BULB TIP 10FT TU (MISCELLANEOUS) ×3 IMPLANT

## 2019-07-08 NOTE — Op Note (Signed)
OPERATIVE REPORT-TOTAL KNEE ARTHROPLASTY   Pre-operative diagnosis- Osteoarthritis  Right knee(s)  Post-operative diagnosis- Osteoarthritis Right knee(s)  Procedure-  Right  Total Knee Arthroplasty  Surgeon- Dione Plover. Minha Fulco, MD  Assistant- Ardeen Jourdain, PA-C   Anesthesia-  Adductor canal block and spinal  EBL-50 mL   Drains Hemovac  Tourniquet time-  Total Tourniquet Time Documented: Thigh (Right) - 32 minutes Total: Thigh (Right) - 32 minutes     Complications- None  Condition-PACU - hemodynamically stable.   Brief Clinical Note  Jamie Cole is a 78 y.o. year old female with end stage OA of her right knee with progressively worsening pain and dysfunction. She has constant pain, with activity and at rest and significant functional deficits with difficulties even with ADLs. She has had extensive non-op management including analgesics, injections of cortisone and viscosupplements, and home exercise program, but remains in significant pain with significant dysfunction.Radiographs show bone on bone arthritis lateral and patellofemoral. She presents now for right Total Knee Arthroplasty.    Procedure in detail---   The patient is brought into the operating room and positioned supine on the operating table. After successful administration of  Adductor canal block and spinal,   a tourniquet is placed high on the  Right thigh(s) and the lower extremity is prepped and draped in the usual sterile fashion. Time out is performed by the operating team and then the  Right lower extremity is wrapped in Esmarch, knee flexed and the tourniquet inflated to 300 mmHg.       A midline incision is made with a ten blade through the subcutaneous tissue to the level of the extensor mechanism. A fresh blade is used to make a medial parapatellar arthrotomy. Soft tissue over the proximal medial tibia is subperiosteally elevated to the joint line with a knife and into the semimembranosus bursa with  a Cobb elevator. Soft tissue over the proximal lateral tibia is elevated with attention being paid to avoiding the patellar tendon on the tibial tubercle. The patella is everted, knee flexed 90 degrees and the ACL and PCL are removed. Findings are bone on bone lateral and patellofemoral with large global osteophytes        The drill is used to create a starting hole in the distal femur and the canal is thoroughly irrigated with sterile saline to remove the fatty contents. The 5 degree Right  valgus alignment guide is placed into the femoral canal and the distal femoral cutting block is pinned to remove 9 mm off the distal femur. Resection is made with an oscillating saw.      The tibia is subluxed forward and the menisci are removed. The extramedullary alignment guide is placed referencing proximally at the medial aspect of the tibial tubercle and distally along the second metatarsal axis and tibial crest. The block is pinned to remove 38mm off the more deficient lateral  side. Resection is made with an oscillating saw. Size 5 is the most appropriate size for the tibia and the proximal tibia is prepared with the modular drill and keel punch for that size.      The femoral sizing guide is placed and size 5 is most appropriate. Rotation is marked off the epicondylar axis and confirmed by creating a rectangular flexion gap at 90 degrees. The size 5 cutting block is pinned in this rotation and the anterior, posterior and chamfer cuts are made with the oscillating saw. The intercondylar block is then placed and that cut is made.  Trial size 5 tibial component, trial size 5 narrow posterior stabilized femur and a 8  mm posterior stabilized rotating platform insert trial is placed. Full extension is achieved with excellent varus/valgus and anterior/posterior balance throughout full range of motion. The patella is everted and thickness measured to be 22  mm. Free hand resection is taken to 12 mm, a 35 template is  placed, lug holes are drilled, trial patella is placed, and it tracks normally. Osteophytes are removed off the posterior femur with the trial in place. All trials are removed and the cut bone surfaces prepared with pulsatile lavage. Cement is mixed and once ready for implantation, the size 5 tibial implant, size  5 narrow posterior stabilized femoral component, and the size 35 patella are cemented in place and the patella is held with the clamp. The trial insert is placed and the knee held in full extension. The Exparel (20 ml mixed with 60 ml saline) is injected into the extensor mechanism, posterior capsule, medial and lateral gutters and subcutaneous tissues.  All extruded cement is removed and once the cement is hard the permanent 8 mm posterior stabilized rotating platform insert is placed into the tibial tray.      The wound is copiously irrigated with saline solution and the extensor mechanism closed over a hemovac drain with #1 V-loc suture. The tourniquet is released for a total tourniquet time of 32  minutes. Flexion against gravity is 140 degrees and the patella tracks normally. Subcutaneous tissue is closed with 2.0 vicryl and subcuticular with running 4.0 Monocryl. The incision is cleaned and dried and steri-strips and a bulky sterile dressing are applied. The limb is placed into a knee immobilizer and the patient is awakened and transported to recovery in stable condition.      Please note that a surgical assistant was a medical necessity for this procedure in order to perform it in a safe and expeditious manner. Surgical assistant was necessary to retract the ligaments and vital neurovascular structures to prevent injury to them and also necessary for proper positioning of the limb to allow for anatomic placement of the prosthesis.   Dione Plover Kayloni Rocco, MD    07/08/2019, 1:01 PM

## 2019-07-08 NOTE — Anesthesia Procedure Notes (Signed)
Procedure Name: MAC Date/Time: 07/08/2019 11:51 AM Performed by: Eben Burow, CRNA Pre-anesthesia Checklist: Patient identified, Emergency Drugs available, Suction available, Patient being monitored and Timeout performed Oxygen Delivery Method: Simple face mask Dental Injury: Teeth and Oropharynx as per pre-operative assessment

## 2019-07-08 NOTE — Care Plan (Signed)
Ortho Bundle Case Management Note  Patient Details  Name: Jamie Cole MRN: [id number] Date of Birth: 24-Sep-1940  R TKA on 07-08-19 DCP:  Home with friend.  1 story home with 2-3 ste. DME:  RW ordered through [facility].  Has a 3-in-1. PT:  [facility].  PT eval scheduled for 07-12-19.                   DME Arranged:  Gilford Rile rolling DME Agency:  [employer]  HH Arranged:  NA [place] Agency:  NA  Additional Comments: Please contact me with any questions of if this plan should need to change.  Marianne Sofia, RN,CCM [facility]  [phone] 07/08/2019, 9:19 AM

## 2019-07-08 NOTE — Transfer of Care (Signed)
Immediate Anesthesia Transfer of Care Note  Patient: Jamie Cole  Procedure(s) Performed: TOTAL KNEE ARTHROPLASTY (Right Knee)  Patient Location: PACU  Anesthesia Type:Spinal  Level of Consciousness: awake, alert  and oriented  Airway & Oxygen Therapy: Patient Spontanous Breathing and Patient connected to face mask oxygen  Post-op Assessment: Report given to RN and Post -op Vital signs reviewed and stable  Post vital signs: Reviewed and stable  Last Vitals:  Vitals Value Taken Time  BP 125/69 07/08/19 1320  Temp    Pulse 86 07/08/19 1322  Resp 22 07/08/19 1322  SpO2 100 % 07/08/19 1322  Vitals shown include unvalidated device data.  Last Pain:  Vitals:   07/08/19 0925  TempSrc:   PainSc: 3       Patients Stated Pain Goal: 3 ([id number])  Complications: No apparent anesthesia complications

## 2019-07-08 NOTE — Discharge Instructions (Addendum)
° °Dr. Frank Aluisio °Total Joint Specialist °[facility] °[address] °[place], [place] [address] °[phone] ° °TOTAL KNEE REPLACEMENT POSTOPERATIVE DIRECTIONS ° °Knee Rehabilitation, Guidelines Following Surgery  °Results after knee surgery are often greatly improved when you follow the exercise, range of motion and muscle strengthening exercises prescribed by your doctor. Safety measures are also important to protect the knee from further injury. Any time any of these exercises cause you to have increased pain or swelling in your knee joint, decrease the amount until you are comfortable again and slowly increase them. If you have problems or questions, call your caregiver or physical therapist for advice.  ° °HOME CARE INSTRUCTIONS  °• Remove items at home which could result in a fall. This includes throw rugs or furniture in walking pathways.  °· ICE to the affected knee every three hours for 30 minutes at a time and then as needed for pain and swelling.  Continue to use ice on the knee for pain and swelling from surgery. You may notice swelling that will progress down to the foot and ankle.  This is normal after surgery.  Elevate the leg when you are not up walking on it.   °· Continue to use the breathing machine which will help keep your temperature down.  It is common for your temperature to cycle up and down following surgery, especially at night when you are not up moving around and exerting yourself.  The breathing machine keeps your lungs expanded and your temperature down. °· Do not place pillow under knee, focus on keeping the knee straight while resting ° °DIET °You may resume your previous home diet once your are discharged from the hospital. ° °DRESSING / WOUND CARE / SHOWERING °You may shower 3 days after surgery, but keep the wounds dry during showering.  You may use an occlusive plastic wrap (Press'n Seal for example), NO SOAKING/SUBMERGING IN THE BATHTUB.  If the bandage  gets wet, change with a clean dry gauze.  If the incision gets wet, pat the wound dry with a clean towel. °You may start showering once you are discharged home but do not submerge the incision under water. Just pat the incision dry and apply a dry gauze dressing on daily. °Change the surgical dressing daily and reapply a dry dressing each time. ° °ACTIVITY °Walk with your walker as instructed. °Use walker as long as suggested by your caregivers. °Avoid periods of inactivity such as sitting longer than an hour when not asleep. This helps prevent blood clots.  °You may resume a sexual relationship in one month or when given the OK by your doctor.  °You may return to work once you are cleared by your doctor.  °Do not drive a car for 6 weeks or until released by you surgeon.  °Do not drive while taking narcotics. ° °WEIGHT BEARING °Weight bearing as tolerated with assist device (walker, cane, etc) as directed, use it as long as suggested by your surgeon or therapist, typically at least 4-6 weeks. ° °POSTOPERATIVE CONSTIPATION PROTOCOL °Constipation - defined medically as fewer than three stools per week and severe constipation as less than one stool per week. ° °One of the most common issues patients have following surgery is constipation.  Even if you have a regular bowel pattern at home, your normal regimen is likely to be disrupted due to multiple reasons following surgery.  Combination of anesthesia, postoperative narcotics, change in appetite and fluid intake all can affect your bowels.    In order to avoid complications following surgery, here are some recommendations in order to help you during your recovery period. ° °Colace (docusate) - Pick up an over-the-counter form of Colace or another stool softener and take twice a day as long as you are requiring postoperative pain medications.  Take with a full glass of water daily.  If you experience loose stools or diarrhea, hold the colace until you stool forms back  up.  If your symptoms do not get better within 1 week or if they get worse, check with your doctor. ° °Dulcolax (bisacodyl) - Pick up over-the-counter and take as directed by the product packaging as needed to assist with the movement of your bowels.  Take with a full glass of water.  Use this product as needed if not relieved by Colace only.  ° °MiraLax (polyethylene glycol) - Pick up over-the-counter to have on hand.  MiraLax is a solution that will increase the amount of water in your bowels to assist with bowel movements.  Take as directed and can mix with a glass of water, juice, soda, coffee, or tea.  Take if you go more than two days without a movement. °Do not use MiraLax more than once per day. Call your doctor if you are still constipated or irregular after using this medication for 7 days in a row. ° °If you continue to have problems with postoperative constipation, please contact the office for further assistance and recommendations.  If you experience "the worst abdominal pain ever" or develop nausea or vomiting, please contact the office immediatly for further recommendations for treatment. ° °ITCHING ° If you experience itching with your medications, try taking only a single pain pill, or even half a pain pill at a time.  You can also use Benadryl over the counter for itching or also to help with sleep.  ° °TED HOSE STOCKINGS °Wear the elastic stockings on both legs for three weeks following surgery during the day but you may remove then at night for sleeping. ° °MEDICATIONS °See your medication summary on the “After Visit Summary” that the nursing staff will review with you prior to discharge.  You may have some home medications which will be placed on hold until you complete the course of blood thinner medication.  It is important for you to complete the blood thinner medication as prescribed by your surgeon.  Continue your approved medications as instructed at time of discharge. ° °PRECAUTIONS °If  you experience chest pain or shortness of breath - call 911 immediately for transfer to the hospital emergency department.  °If you develop a fever greater that 101 F, purulent drainage from wound, increased redness or drainage from wound, foul odor from the wound/dressing, or calf pain - CONTACT YOUR SURGEON.   °                                                °FOLLOW-UP APPOINTMENTS °Make sure you keep all of your appointments after your operation with your surgeon and caregivers. You should call the office at the above phone number and make an appointment for approximately two weeks after the date of your surgery or on the date instructed by your surgeon outlined in the "After Visit Summary". ° ° °RANGE OF MOTION AND STRENGTHENING EXERCISES  °Rehabilitation of the knee is important following a knee injury or   an operation. After just a few days of immobilization, the muscles of the thigh which control the knee become weakened and shrink (atrophy). Knee exercises are designed to build up the tone and strength of the thigh muscles and to improve knee motion. Often times heat used for twenty to thirty minutes before working out will loosen up your tissues and help with improving the range of motion but do not use heat for the first two weeks following surgery. These exercises can be done on a training (exercise) mat, on the floor, on a table or on a bed. Use what ever works the best and is most comfortable for you Knee exercises include:  °• Leg Lifts - While your knee is still immobilized in a splint or cast, you can do straight leg raises. Lift the leg to 60 degrees, hold for 3 sec, and slowly lower the leg. Repeat 10-20 times 2-3 times daily. Perform this exercise against resistance later as your knee gets better.  °• Quad and Hamstring Sets - Tighten up the muscle on the front of the thigh (Quad) and hold for 5-10 sec. Repeat this 10-20 times hourly. Hamstring sets are done by pushing the foot backward against an  object and holding for 5-10 sec. Repeat as with quad sets.  °· Leg Slides: Lying on your back, slowly slide your foot toward your buttocks, bending your knee up off the floor (only go as far as is comfortable). Then slowly slide your foot back down until your leg is flat on the floor again. °· Angel Wings: Lying on your back spread your legs to the side as far apart as you can without causing discomfort.  °A rehabilitation program following serious knee injuries can speed recovery and prevent re-injury in the future due to weakened muscles. Contact your doctor or a physical therapist for more information on knee rehabilitation.  ° °IF YOU ARE TRANSFERRED TO A SKILLED REHAB FACILITY °If the patient is transferred to a skilled rehab facility following release from the hospital, a list of the current medications will be sent to the facility for the patient to continue.  When discharged from the skilled rehab facility, please have the facility set up the patient's Home Health Physical Therapy prior to being released. Also, the skilled facility will be responsible for providing the patient with their medications at time of release from the facility to include their pain medication, the muscle relaxants, and their blood thinner medication. If the patient is still at the rehab facility at time of the two week follow up appointment, the skilled rehab facility will also need to assist the patient in arranging follow up appointment in our office and any transportation needs. ° °MAKE SURE YOU:  °• Understand these instructions.  °• Get help right away if you are not doing well or get worse.  ° ° °Pick up stool softner and laxative for home use following surgery while on pain medications. °Do not submerge incision under water. °Please use good hand washing techniques while changing dressing each day. °May shower starting three days after surgery. °Please use a clean towel to pat the incision dry following showers. °Continue to  use ice for pain and swelling after surgery. °Do not use any lotions or creams on the incision until instructed by your surgeon. ° ° °_____________________________________________ ° °Information on my medicine - XARELTO® (Rivaroxaban) ° °This medication education was reviewed with me or my healthcare representative as part of my discharge preparation.  ° °Why was   Xarelto® prescribed for you? °Xarelto® was prescribed for you to reduce the risk of blood clots forming after orthopedic surgery. The medical term for these abnormal blood clots is venous thromboembolism (VTE). ° °What do you need to know about xarelto® ? °Take your Xarelto® ONCE DAILY at the same time every day. °You may take it either with or without food. ° °If you have difficulty swallowing the tablet whole, you may crush it and mix in applesauce just prior to taking your dose. ° °Take Xarelto® exactly as prescribed by your doctor and DO NOT stop taking Xarelto® without talking to the doctor who prescribed the medication.  Stopping without other VTE prevention medication to take the place of Xarelto® may increase your risk of developing a clot. ° °After discharge, you should have regular check-up appointments with your healthcare provider that is prescribing your Xarelto®.   ° °What do you do if you miss a dose? °If you miss a dose, take it as soon as you remember on the same day then continue your regularly scheduled once daily regimen the next day. Do not take two doses of Xarelto® on the same day.  ° °Important Safety Information °A possible side effect of Xarelto® is bleeding. You should call your healthcare provider right away if you experience any of the following: °? Bleeding from an injury or your nose that does not stop. °? Unusual colored urine (red or dark brown) or unusual colored stools (red or black). °? Unusual bruising for unknown reasons. °? A serious fall or if you hit your head (even if there is no bleeding). ° °Some medicines may  interact with Xarelto® and might increase your risk of bleeding while on Xarelto®. To help avoid this, consult your healthcare provider or pharmacist prior to using any new prescription or non-prescription medications, including herbals, vitamins, non-steroidal anti-inflammatory drugs (NSAIDs) and supplements. ° °This website has more information on Xarelto®: www.xarelto.com. ° ° °

## 2019-07-08 NOTE — Evaluation (Signed)
Physical Therapy Evaluation Patient Details Name: Jamie Cole MRN: [id number] DOB: [birth year] Today's Date: 07/08/2019   History of Present Illness  78 yo female s/p R TKR on 07/08/19. PMH includes osteoporosis, shingles, breast cancer.  Clinical Impression  Pt presents with R knee pain, decreased R knee ROM, difficulty performing mobility tasks, and decreased activity tolerance post-operatively. Pt to benefit from acute PT to address deficits. Pt ambulated short hallway distance with RW with min guard assist +2 for chair follow, x1 seated rest break due to pt pain. Pt educated on ankle pumps (20/hour) to perform this afternoon/evening to increase circulation, to pt's tolerance and limited by pain. PT to progress mobility as tolerated, and will continue to follow acutely.        Follow Up Recommendations Follow surgeon's recommendation for DC plan and follow-up therapies;Supervision for mobility/OOB(OPPT on 07/12/19)    Equipment Recommendations  Rolling walker with 5" wheels    Recommendations for Other Services       Precautions / Restrictions Precautions Precautions: Fall Required Braces or Orthoses: Knee Immobilizer - Right Knee Immobilizer - Right: On when out of bed or walking;Discontinue once straight leg raise with < 10 degree lag Restrictions Weight Bearing Restrictions: No Other Position/Activity Restrictions: WBAT      Mobility  Bed Mobility Overal bed mobility: Needs Assistance Bed Mobility: Supine to Sit     Supine to sit: HOB elevated;Min assist     General bed mobility comments: Min assist for RLE lifting and translation to EOB, trunk elevation with use of HHA. Increased time and effort to scoot to EOB.  Transfers Overall transfer level: Needs assistance Equipment used: Rolling walker (2 wheeled) Transfers: Sit to/from Stand Sit to Stand: Min assist;From elevated surface         General transfer comment: Min assist for power up, steadying. Verbal  cuing for hand placement when rising. Sit to stand x2, once from EOB and once from recliner in hallway.  Ambulation/Gait Ambulation/Gait assistance: Min guard;+2 safety/equipment(chair follow) Gait Distance (Feet): 45 [id number]) Assistive device: Rolling walker (2 wheeled) Gait Pattern/deviations: Step-through pattern;Step-to pattern;Decreased stride length;Trunk flexed Gait velocity: decr   General Gait Details: Min guard for safety, verbal cuing for sequencing, placement in RW, upright posture. Seated rest break after 15 ft ambulation due to pt fatigue and pain.  Stairs            Wheelchair Mobility    Modified Rankin (Stroke Patients Only)       Balance Overall balance assessment: Mild deficits observed, not formally tested                                           Pertinent Vitals/Pain Pain Assessment: 0-10 Pain Score: 4  Pain Location: R knee Pain Descriptors / Indicators: Sore Pain Intervention(s): Limited activity within patient's tolerance;Monitored during session;Premedicated before session;Repositioned;Ice applied;Patient requesting pain meds-RN notified    Home Living Family/patient expects to be discharged to:: Private residence Living Arrangements: Alone Available Help at Discharge: Friend(s);Family;Available 24 hours/day Type of Home: House Home Access: Stairs to enter Entrance Stairs-Rails: Right Entrance Stairs-Number of Steps: 1+2+1 Home Layout: One level Home Equipment: Walker - standard;Cane - single point;Bedside commode;Shower seat      Prior Function Level of Independence: Independent with assistive device(s)         Comments: pt reports using standard walker PTA     Hand  Dominance   Dominant Hand: Right    Extremity/Trunk Assessment   Upper Extremity Assessment Upper Extremity Assessment: Overall WFL for tasks assessed    Lower Extremity Assessment Lower Extremity Assessment: Overall WFL for tasks  assessed;RLE deficits/detail RLE Deficits / Details: suspected post-surgical weakness; able to perform ankle pumps, quad set, heel slide to 50*, SLR with heavy lift assist RLE Sensation: WNL    Cervical / Trunk Assessment Cervical / Trunk Assessment: Normal  Communication   Communication: No difficulties  Cognition Arousal/Alertness: Awake/alert Behavior During Therapy: WFL for tasks assessed/performed;Anxious Overall Cognitive Status: Within Functional Limits for tasks assessed                                 General Comments: Pt with anxiety about mobility      General Comments      Exercises Total Joint Exercises Heel Slides: AAROM;Right;5 reps;Seated   Assessment/Plan    PT Assessment Patient needs continued PT services  PT Problem List Decreased strength;Decreased mobility;Decreased range of motion;Decreased activity tolerance;Decreased balance;Decreased knowledge of use of DME;Pain;Decreased safety awareness       PT Treatment Interventions DME instruction;Therapeutic activities;Gait training;Therapeutic exercise;Patient/family education;Balance training;Stair training;Functional mobility training    PT Goals (Current goals can be found in the Care Plan section)  Acute Rehab PT Goals Patient Stated Goal: walk better PT Goal Formulation: With patient Time For Goal Achievement: 07/15/19 Potential to Achieve Goals: Good    Frequency 7X/week   Barriers to discharge        Co-evaluation               AM-PAC PT "6 Clicks" Mobility  Outcome Measure Help needed turning from your back to your side while in a flat bed without using bedrails?: A Little Help needed moving from lying on your back to sitting on the side of a flat bed without using bedrails?: A Little Help needed moving to and from a bed to a chair (including a wheelchair)?: A Little Help needed standing up from a chair using your arms (e.g., wheelchair or bedside chair)?: A  Little Help needed to walk in hospital room?: A Little Help needed climbing 3-5 steps with a railing? : A Lot 6 Click Score: 17    End of Session Equipment Utilized During Treatment: Gait belt Activity Tolerance: Patient tolerated treatment well;Patient limited by pain Patient left: in chair;with chair alarm set;with call bell/phone within reach;with SCD's reapplied Nurse Communication: Mobility status PT Visit Diagnosis: Other abnormalities of gait and mobility (R26.89);Difficulty in walking, not elsewhere classified (R26.2)    Time: [id number] PT Time Calculation (min) (ACUTE ONLY): 27 min   Charges:   PT Evaluation $PT Eval Low Complexity: 1 Low PT Treatments $Gait Training: 8-22 mins        Liviana Mills E, PT Acute Rehabilitation Services Pager [phone]  Office [phone]  Arryana Tolleson D Elonda Husky 07/08/2019, 5:45 PM

## 2019-07-08 NOTE — Interval H&P Note (Signed)
History and Physical Interval Note:  07/08/2019 10:51 AM  Jamie Cole  has presented today for surgery, with the diagnosis of right knee osteoarthritis.  The various methods of treatment have been discussed with the patient and family. After consideration of risks, benefits and other options for treatment, the patient has consented to  Procedure(s) with comments: TOTAL KNEE ARTHROPLASTY (Right) - 53min as a surgical intervention.  The patient's history has been reviewed, patient examined, no change in status, stable for surgery.  I have reviewed the patient's chart and labs.  Questions were answered to the patient's satisfaction.     Pilar Plate Reggie Bise

## 2019-07-08 NOTE — Anesthesia Procedure Notes (Signed)
Anesthesia Regional Block: Adductor canal block   Pre-Anesthetic Checklist: ,, timeout performed, Correct Patient, Correct Site, Correct Laterality, Correct Procedure,, site marked, risks and benefits discussed, Surgical consent,  Pre-op evaluation,  At surgeon's request and post-op pain management  Laterality: Right  Prep: chloraprep       Needles:  Injection technique: Single-shot  Needle Type: Echogenic Stimulator Needle     Needle Length: 9cm  Needle Gauge: 21     Additional Needles:   Procedures:,,,, ultrasound used (permanent image in chart),,,,  Narrative:  Start time: 07/08/2019 11:05 AM End time: 07/08/2019 11:15 AM Injection made incrementally with aspirations every 5 mL.  Performed by: Personally  Anesthesiologist: Murvin Natal, MD  Additional Notes: Functioning IV was confirmed and monitors were applied. A time-out was performed. Hand hygiene and sterile gloves were used. The thigh was placed in a frog-leg position and prepped in a sterile fashion. A 14mm 21ga Arrow echogenic stimulator needle was placed using ultrasound guidance.  Negative aspiration and negative test dose prior to incremental administration of local anesthetic. The patient tolerated the procedure well.

## 2019-07-08 NOTE — Anesthesia Procedure Notes (Signed)
Spinal  Patient location during procedure: OR Start time: 07/08/2019 11:50 AM End time: 07/08/2019 12:00 PM Staffing Anesthesiologist: Murvin Natal, MD Performed: anesthesiologist  Preanesthetic Checklist Completed: patient identified, surgical consent, pre-op evaluation, timeout performed, IV checked, risks and benefits discussed and monitors and equipment checked Spinal Block Patient position: sitting Prep: DuraPrep Patient monitoring: cardiac monitor, continuous pulse ox and blood pressure Approach: midline Location: L4-5 Injection technique: single-shot Needle Needle type: Whitacre  Needle gauge: 22 G Needle length: 9 cm Assessment Sensory level: T10 Additional Notes Functioning IV was confirmed and monitors were applied. Sterile prep and drape, including hand hygiene and sterile gloves were used. The patient was positioned and the spine was prepped. The skin was anesthetized with lidocaine.  Free flow of clear CSF was obtained on the third attempt prior to injecting local anesthetic into the CSF.  The spinal needle aspirated freely following injection.  The needle was carefully withdrawn.  The patient tolerated the procedure well.

## 2019-07-08 NOTE — Anesthesia Postprocedure Evaluation (Signed)
Anesthesia Post Note  Patient: Jamie Cole  Procedure(s) Performed: TOTAL KNEE ARTHROPLASTY (Right Knee)     Patient location during evaluation: PACU Anesthesia Type: Spinal and Regional Level of consciousness: oriented and awake and alert Pain management: pain level controlled Vital Signs Assessment: post-procedure vital signs reviewed and stable Respiratory status: spontaneous breathing, respiratory function stable and patient connected to nasal cannula oxygen Cardiovascular status: blood pressure returned to baseline and stable Postop Assessment: no headache, no backache, no apparent nausea or vomiting and spinal receding Anesthetic complications: no    Last Vitals:  Vitals:   07/08/19 1528 07/08/19 1627  BP: (!) 143/65 (!) 155/79  Pulse: 84 86  Resp: 16 17  Temp:  36.4 C  SpO2: 100% 100%    Last Pain:  Vitals:   07/08/19 1631  TempSrc:   PainSc: 2                  Tiya Schrupp P Janiyah Beery

## 2019-07-08 NOTE — Progress Notes (Signed)
AssistedDr. Ellender with right, ultrasound guided, adductor canal block. Side rails up, monitors on throughout procedure. See vital signs in flow sheet. Tolerated Procedure well.  

## 2019-07-08 NOTE — Anesthesia Preprocedure Evaluation (Addendum)
Anesthesia Evaluation  Patient identified by MRN, date of birth, ID band Patient awake    Reviewed: Allergy & Precautions, NPO status , Patient's Chart, lab work & pertinent test results  Airway Mallampati: II  TM Distance: >3 FB Neck ROM: Full    Dental no notable dental hx.    Pulmonary neg pulmonary ROS,    Pulmonary exam normal breath sounds clear to auscultation       Cardiovascular negative cardio ROS Normal cardiovascular exam Rhythm:Regular Rate:Normal     Neuro/Psych negative neurological ROS  negative psych ROS   GI/Hepatic negative GI ROS, Neg liver ROS,   Endo/Other  Hypothyroidism   Renal/GU negative Renal ROS     Musculoskeletal  (+) Arthritis , Pre-op eval per Dr. Ellene Route reviewed   Abdominal (+) + obese,   Peds  Hematology negative hematology ROS (+)   Anesthesia Other Findings right knee osteoarthritis  Reproductive/Obstetrics                            Anesthesia Physical Anesthesia Plan  ASA: II  Anesthesia Plan: Regional and Spinal   Post-op Pain Management:  Regional for Post-op pain   Induction: Intravenous  PONV Risk Score and Plan: Ondansetron, Dexamethasone and Treatment may vary due to age or medical condition  Airway Management Planned: Simple Face Mask  Additional Equipment:   Intra-op Plan:   Post-operative Plan: Extubation in OR  Informed Consent: I have reviewed the patients History and Physical, chart, labs and discussed the procedure including the risks, benefits and alternatives for the proposed anesthesia with the patient or authorized representative who has indicated his/her understanding and acceptance.     Dental advisory given  Plan Discussed with: CRNA  Anesthesia Plan Comments:        Anesthesia Quick Evaluation

## 2019-07-08 NOTE — Plan of Care (Signed)

## 2019-07-09 ENCOUNTER — Encounter (HOSPITAL_COMMUNITY): Payer: Self-pay | Admitting: Orthopedic Surgery

## 2019-07-09 DIAGNOSIS — M1711 Unilateral primary osteoarthritis, right knee: Secondary | ICD-10-CM | POA: Diagnosis not present

## 2019-07-09 LAB — BASIC METABOLIC PANEL
Anion gap: 9 (ref 5–15)
BUN: 10 mg/dL (ref 8–23)
CO2: 21 mmol/L — ABNORMAL LOW (ref 22–32)
Calcium: 9 mg/dL (ref 8.9–10.3)
Chloride: 108 mmol/L (ref 98–111)
Creatinine, Ser: 0.39 mg/dL — ABNORMAL LOW (ref 0.44–1.00)
GFR calc Af Amer: >60 mL/min (ref >60)
GFR calc non Af Amer: >60 mL/min (ref >60)
Glucose, Bld: 160 mg/dL — ABNORMAL HIGH (ref 70–99)
Potassium: 4.4 mmol/L (ref 3.5–5.1)
Sodium: 138 mmol/L (ref 135–145)

## 2019-07-09 LAB — CBC
HCT: 39 % (ref 36.0–46.0)
Hemoglobin: 12.6 g/dL (ref 12.0–15.0)
MCH: 31.7 pg (ref 26.0–34.0)
MCHC: 32.3 g/dL (ref 30.0–36.0)
MCV: 98 fL (ref 80.0–100.0)
Platelets: 221 10*3/uL (ref 150–400)
RBC: 3.98 MIL/uL (ref 3.87–5.11)
RDW: 13.2 % (ref 11.5–15.5)
WBC: 12.7 10*3/uL — ABNORMAL HIGH (ref 4.0–10.5)
nRBC: 0 % (ref 0.0–0.2)

## 2019-07-09 MED ORDER — TRAMADOL HCL 50 MG PO TABS: 50.0000 mg | ORAL_TABLET | Freq: Four times a day (QID) | ORAL | 0 refills | Status: DC | PRN

## 2019-07-09 MED ORDER — RIVAROXABAN 10 MG PO TABS: 10.0000 mg | ORAL_TABLET | Freq: Every day | ORAL | 0 refills | Status: DC

## 2019-07-09 MED ORDER — OXYCODONE HCL 5 MG PO TABS: 5.0000 mg | ORAL_TABLET | Freq: Four times a day (QID) | ORAL | 0 refills | Status: DC | PRN

## 2019-07-09 MED ORDER — METHOCARBAMOL 500 MG PO TABS: 500.0000 mg | ORAL_TABLET | Freq: Four times a day (QID) | ORAL | 0 refills | Status: DC | PRN

## 2019-07-09 MED ORDER — GABAPENTIN 100 MG PO CAPS: 100.0000 mg | ORAL_CAPSULE | Freq: Three times a day (TID) | ORAL | 0 refills | Status: DC

## 2019-07-09 NOTE — Progress Notes (Signed)
Subjective: 1 Day Post-Op Procedure(s) (LRB): TOTAL KNEE ARTHROPLASTY (Right) Patient reports pain as mild.   Patient seen in rounds by Dr. Wynelle Link. Patient is well, and has had no acute complaints or problems other than pain in the right knee. Denies chest pain, SOB, or calf pain. Foley catheter to be removed this AM. No issues overnight.  We will continue therapy today.   Objective: Vital signs in last 24 hours: Temp:  [97.4 F (36.3 C)-97.9 F (36.6 C)] 97.4 F (36.3 C) (12/08 0456) Pulse Rate:  [69-96] 75 (12/08 0456) Resp:  [2-29] 17 (12/08 0456) BP: (125-164)/(59-91) 149/67 (12/08 0456) SpO2:  [96 %-100 %] 99 % (12/08 0456) Weight:  [76.2 kg] 76.2 kg (12/07 0925)  Intake/Output from previous day:  Intake/Output Summary (Last 24 hours) at 07/09/2019 0742 Last data filed at 07/09/2019 0600 Gross per 24 hour  Intake 3681.62 ml  Output 2635 ml  Net 1046.62 ml    Labs: Recent Labs    07/09/19 0440  HGB 12.6   Recent Labs    07/09/19 0440  WBC 12.7*  RBC 3.98  HCT 39.0  PLT 221   Recent Labs    07/09/19 0440  NA 138  K 4.4  CL 108  CO2 21*  BUN 10  CREATININE 0.39*  GLUCOSE 160*  CALCIUM 9.0   Exam: General - Patient is Alert and Oriented Extremity - Neurologically intact Neurovascular intact Sensation intact distally Dorsiflexion/Plantar flexion intact Dressing - dressing C/D/I Motor Function - intact, moving foot and toes well on exam.   Past Medical History:  Diagnosis Date  . Basal cell carcinoma of neck       . Breast cancer ([place]) 08/31/11   DCIS; right breast, ER/PR +  . Breast cancer ([place]) 2/13   9 MM R breast- E+/P+ HVR 2-  . Complication of anesthesia    slow to wake up  . Compressed cervical disc    compression deformity  at T12  . Endometrial hyperplasia, simple   . Endometrial polyp   . Family history of adverse reaction to anesthesia 1991/11/12   Mother died during hip surgery  . Fracture    right arm  . H/O TB skin testing  1970's   + INH rx  . Hx of radiation therapy 10/18/11 to 11/18/11   right breast  . Hx of radiation therapy 1957   4 tx to face for acne  . Hypothyroidism   . Metacarpal bone fracture 08/30/2012   Right 5th finger fracture  . Osteoporosis   . Personal history of radiation therapy     Assessment/Plan: 1 Day Post-Op Procedure(s) (LRB): TOTAL KNEE ARTHROPLASTY (Right) Active Problems:   OA (osteoarthritis) of knee  Estimated body mass index is 28.84 kg/m as calculated from the following:   Height as of this encounter: 5\' 4"  (1.626 m).   Weight as of this encounter: 76.2 kg. Advance diet Up with therapy D/C IV fluids  Anticipated LOS equal to or greater than 2 midnights due to - Age 80 and older with one or more of the following:  - Obesity  - Expected need for hospital services (PT, OT, Nursing) required for safe  discharge  - Anticipated need for postoperative skilled nursing care or inpatient rehab  - Active co-morbidities: Diabetes OR   - Unanticipated findings during/Post Surgery: None  - Patient is a high risk of re-admission due to: None    DVT Prophylaxis - Xarelto Weight bearing as tolerated. D/C O2 and  pulse ox and try on room air. Hemovac pulled without difficulty, will continue therapy today.  Plan is to go Home after hospital stay. Plan for discharge later today if progresses with therapy and meeting her goals. Scheduled for outpatient physical therapy at [facility]. Follow-up in the office in 2 weeks.   Theresa Duty, PA-C Orthopedic Surgery 07/09/2019, 7:42 AM

## 2019-07-09 NOTE — Progress Notes (Signed)
Physical Therapy Treatment Patient Details Name: Jamie Cole MRN: [id number] DOB: 03-12-1941 Today's Date: 07/09/2019    History of Present Illness 78 yo female s/p R TKR on 07/08/19. PMH includes osteoporosis, shingles, breast cancer.    PT Comments    Pt ambulated and practiced safe stair technique.  Pt provided with stair and HEP handouts.  All questions answered within scope of practice.  Pt to d/c home today.    Follow Up Recommendations  Follow surgeon's recommendation for DC plan and follow-up therapies;Supervision for mobility/OOB     Equipment Recommendations  Rolling walker with 5" wheels    Recommendations for Other Services       Precautions / Restrictions Precautions Precautions: Fall;Knee Required Braces or Orthoses: Knee Immobilizer - Right Knee Immobilizer - Right: On when out of bed or walking;Discontinue once straight leg raise with < 10 degree lag Restrictions Other Position/Activity Restrictions: WBAT    Mobility  Bed Mobility               General bed mobility comments: pt in recliner  Transfers Overall transfer level: Needs assistance Equipment used: Rolling walker (2 wheeled) Transfers: Sit to/from Stand Sit to Stand: Min guard         General transfer comment: verbal cues for UE and LE positioning  Ambulation/Gait Ambulation/Gait assistance: Min guard Gait Distance (Feet): 30 Feet Assistive device: Rolling walker (2 wheeled) Gait Pattern/deviations: Step-to pattern;Decreased stance time - right;Antalgic;Trunk flexed     General Gait Details: verbal cues for sequence, RW positioning, step length, posture   Stairs Stairs: Yes Stairs assistance: Min guard Stair Management: Step to pattern;Backwards;With walker Number of Stairs: 3 General stair comments: verbal cues for sequence, RW positioning, safety; pt reports understanding; provided handout   Wheelchair Mobility    Modified Rankin (Stroke Patients Only)        Balance                                            Cognition Arousal/Alertness: Awake/alert Behavior During Therapy: WFL for tasks assessed/performed Overall Cognitive Status: Within Functional Limits for tasks assessed                                        Exercises      General Comments        Pertinent Vitals/Pain Pain Assessment: 0-10 Pain Score: 5  Pain Location: R knee Pain Descriptors / Indicators: Sore;Aching Pain Intervention(s): Repositioned;Monitored during session    Home Living                      Prior Function            PT Goals (current goals can now be found in the care plan section) Progress towards PT goals: Progressing toward goals    Frequency    7X/week      PT Plan Current plan remains appropriate    Co-evaluation              AM-PAC PT "6 Clicks" Mobility   Outcome Measure  Help needed turning from your back to your side while in a flat bed without using bedrails?: A Little Help needed moving from lying on your back to sitting on the side of a flat bed  without using bedrails?: A Little Help needed moving to and from a bed to a chair (including a wheelchair)?: A Little Help needed standing up from a chair using your arms (e.g., wheelchair or bedside chair)?: A Little Help needed to walk in hospital room?: A Little Help needed climbing 3-5 steps with a railing? : A Little 6 Click Score: 18    End of Session Equipment Utilized During Treatment: Gait belt Activity Tolerance: Patient tolerated treatment well Patient left: in chair;with call bell/phone within reach Nurse Communication: Mobility status PT Visit Diagnosis: Other abnormalities of gait and mobility (R26.89);Difficulty in walking, not elsewhere classified (R26.2)     Time: 1425-1450 PT Time Calculation (min) (ACUTE ONLY): 25 min  Charges:  $Gait Training: 8-22 mins                     [name], PT, DPT Acute  Rehabilitation Services Office: [phone] Pager: [phone]  [name] 07/09/2019, 5:12 PM

## 2019-07-09 NOTE — Care Management CC44 (Signed)
Condition Code 44 Documentation Completed  Patient Details  Name: Jamie Cole MRN: [id number] Date of Birth: 09/22/1940   Condition Code 44 given:  Yes Patient signature on Condition Code 44 notice:  Yes Documentation of 2 MD's agreement:  Yes Code 44 added to claim:  Yes    Lia Hopping, LCSW 07/09/2019, 10:02 AM

## 2019-07-09 NOTE — Progress Notes (Signed)
Physical Therapy Treatment Patient Details Name: Jamie Cole MRN: [id number] DOB: 04-20-1941 Today's Date: 07/09/2019    History of Present Illness 78 yo female s/p R TKR on 07/08/19. PMH includes osteoporosis, shingles, breast cancer.    PT Comments    Pt assisted with ambulating and performed LE exercises.  Will return for afternoon session to practice steps.   Follow Up Recommendations  Follow surgeon's recommendation for DC plan and follow-up therapies;Supervision for mobility/OOB     Equipment Recommendations  Rolling walker with 5" wheels    Recommendations for Other Services       Precautions / Restrictions Precautions Precautions: Fall;Knee Required Braces or Orthoses: Knee Immobilizer - Right Knee Immobilizer - Right: On when out of bed or walking;Discontinue once straight leg raise with < 10 degree lag Restrictions Other Position/Activity Restrictions: WBAT    Mobility  Bed Mobility               General bed mobility comments: pt in recliner  Transfers Overall transfer level: Needs assistance Equipment used: Rolling walker (2 wheeled) Transfers: Sit to/from Stand Sit to Stand: Min guard         General transfer comment: verbal cues for UE and LE positioning  Ambulation/Gait Ambulation/Gait assistance: Min guard Gait Distance (Feet): 80 Feet Assistive device: Rolling walker (2 wheeled) Gait Pattern/deviations: Step-to pattern;Decreased stance time - right;Antalgic;Trunk flexed     General Gait Details: verbal cues for sequence, RW positioning, step length, posture   Stairs             Wheelchair Mobility    Modified Rankin (Stroke Patients Only)       Balance                                            Cognition Arousal/Alertness: Awake/alert Behavior During Therapy: WFL for tasks assessed/performed Overall Cognitive Status: Within Functional Limits for tasks assessed                                         Exercises Total Joint Exercises Ankle Circles/Pumps: AROM;Both;10 reps Quad Sets: AROM;Both;10 reps Short Arc QuadSinclair Cole;Right;10 reps Heel Slides: AAROM;10 reps;Right;Seated Hip ABduction/ADduction: AAROM;Right;10 reps Straight Leg Raises: AAROM;Right;10 reps    General Comments        Pertinent Vitals/Pain Pain Assessment: 0-10 Pain Score: 4  Pain Location: R knee Pain Descriptors / Indicators: Sore;Aching Pain Intervention(s): Monitored during session;Repositioned;Ice applied    Home Living                      Prior Function            PT Goals (current goals can now be found in the care plan section) Progress towards PT goals: Progressing toward goals    Frequency    7X/week      PT Plan Current plan remains appropriate    Co-evaluation              AM-PAC PT "6 Clicks" Mobility   Outcome Measure  Help needed turning from your back to your side while in a flat bed without using bedrails?: A Little Help needed moving from lying on your back to sitting on the side of a flat bed without using bedrails?: A Little Help  needed moving to and from a bed to a chair (including a wheelchair)?: A Little Help needed standing up from a chair using your arms (e.g., wheelchair or bedside chair)?: A Little Help needed to walk in hospital room?: A Little Help needed climbing 3-5 steps with a railing? : A Lot 6 Click Score: 17    End of Session Equipment Utilized During Treatment: Gait belt Activity Tolerance: Patient tolerated treatment well;Patient limited by pain Patient left: in chair;with chair alarm set;with call bell/phone within reach Nurse Communication: Mobility status PT Visit Diagnosis: Other abnormalities of gait and mobility (R26.89);Difficulty in walking, not elsewhere classified (R26.2)     Time: 1011-1040 PT Time Calculation (min) (ACUTE ONLY): 29 min  Charges:  $Gait Training: 8-22 mins $Therapeutic Exercise:  8-22 mins                    Jamie Cole, PT, DPT Acute Rehabilitation Services Office: [phone] Pager: [phone]    [name] 07/09/2019, 1:13 PM

## 2019-07-09 NOTE — Progress Notes (Signed)
Discharge paperwork discussed with pt at the bedside.  She demonstrated understanding. Pt to be escorted to main lobby by wheelchair.

## 2019-07-09 NOTE — Care Management Obs Status (Signed)
[place] NOTIFICATION   Patient Details  Name: Jamie Cole MRN: [id number] Date of Birth: 01-Sep-1940   Medicare Observation Status Notification Given:  Yes    Lia Hopping, LCSW 07/09/2019, 10:02 AM

## 2019-07-09 NOTE — Plan of Care (Signed)

## 2019-07-10 NOTE — Discharge Summary (Signed)
Physician Discharge Summary   Patient ID: Jamie Cole MRN: [id number] DOB/AGE: [birth year] 80 y.o.  Admit date: 07/08/2019 Discharge date: 07/09/2019  Primary Diagnosis: Osteoarthritis, right knee   Admission Diagnoses:  Past Medical History:  Diagnosis Date  . Basal cell carcinoma of neck       . Breast cancer ([place]) 08/31/11   DCIS; right breast, ER/PR +  . Breast cancer ([place]) 2/13   9 MM R breast- E+/P+ HVR 2-  . Complication of anesthesia    slow to wake up  . Compressed cervical disc    compression deformity  at T12  . Endometrial hyperplasia, simple   . Endometrial polyp   . Family history of adverse reaction to anesthesia 11-06-91   Mother died during hip surgery  . Fracture    right arm  . H/O TB skin testing 1970's   + INH rx  . Hx of radiation therapy 10/18/11 to 11/18/11   right breast  . Hx of radiation therapy 1957   4 tx to face for acne  . Hypothyroidism   . Metacarpal bone fracture 08/30/2012   Right 5th finger fracture  . Osteoporosis   . Personal history of radiation therapy    Discharge Diagnoses:   Active Problems:   OA (osteoarthritis) of knee  Estimated body mass index is 28.84 kg/m as calculated from the following:   Height as of this encounter: 5\' 4"  (1.626 m).   Weight as of this encounter: 76.2 kg.  Procedure:  Procedure(s) (LRB): TOTAL KNEE ARTHROPLASTY (Right)   Consults: None  HPI: Jamie Cole is a 78 y.o. year old female with end stage OA of her right knee with progressively worsening pain and dysfunction. She has constant pain, with activity and at rest and significant functional deficits with difficulties even with ADLs. She has had extensive non-op management including analgesics, injections of cortisone and viscosupplements, and home exercise program, but remains in significant pain with significant dysfunction.Radiographs show bone on bone arthritis lateral and patellofemoral. She presents now for right Total Knee Arthroplasty.   Laboratory Data: Admission on 07/08/2019, Discharged on 07/09/2019  Component Date Value Ref Range Status  . WBC 07/09/2019 12.7* 4.0 - 10.5 K/uL Final  . RBC 07/09/2019 3.98  3.87 - 5.11 MIL/uL Final  . Hemoglobin 07/09/2019 12.6  12.0 - 15.0 g/dL Final  . HCT 07/09/2019 39.0  36.0 - 46.0 % Final  . MCV 07/09/2019 98.0  80.0 - 100.0 fL Final  . MCH 07/09/2019 31.7  26.0 - 34.0 pg Final  . MCHC 07/09/2019 32.3  30.0 - 36.0 g/dL Final  . RDW 07/09/2019 13.2  11.5 - 15.5 % Final  . Platelets 07/09/2019 221  150 - 400 K/uL Final  . nRBC 07/09/2019 0.0  0.0 - 0.2 % Final   Performed at [facility], [place] [address] [place], [place] [address]  . Sodium 07/09/2019 138  135 - 145 mmol/L Final  . Potassium 07/09/2019 4.4  3.5 - 5.1 mmol/L Final  . Chloride 07/09/2019 108  98 - 111 mmol/L Final  . CO2 07/09/2019 21* 22 - 32 mmol/L Final  . Glucose, Bld 07/09/2019 160* 70 - 99 mg/dL Final  . BUN 07/09/2019 10  8 - 23 mg/dL Final  . Creatinine, Ser 07/09/2019 0.39* 0.44 - 1.00 mg/dL Final  . Calcium 07/09/2019 9.0  8.9 - 10.3 mg/dL Final  . GFR calc non Af Amer 07/09/2019 >60  >60 mL/min Final  . GFR calc Af  Amer 07/09/2019 >60  >60 mL/min Final  . Anion gap 07/09/2019 9  5 - 15 Final   Performed at [facility], [place] [address], [place], [place] [address]  Hospital Outpatient Visit on 07/04/2019  Component Date Value Ref Range Status  . SARS-CoV-2, NAA 07/04/2019 NOT DETECTED  NOT DETECTED Final   Comment: (NOTE) This nucleic acid amplification test was developed and its performance characteristics determined by [employer]. Nucleic acid amplification tests include PCR and TMA. This test has not been FDA cleared or approved. This test has been authorized by FDA under an Emergency Use Authorization (EUA). This test is only authorized for the duration of time the declaration that circumstances exist justifying the authorization of the emergency  use of in vitro diagnostic tests for detection of SARS-CoV-2 virus and/or diagnosis of COVID-19 infection under section 564(b)(1) of the Act, 21 U.S.C. [id number]) (1), unless the authorization is terminated or revoked sooner. When diagnostic testing is negative, the possibility of a false negative result should be considered in the context of a patient's recent exposures and the presence of clinical signs and symptoms consistent with COVID-19. An individual without symptoms of COVID- 19 and who is not shedding SARS-CoV-2 vi                          rus would expect to have a negative (not detected) result in this assay. Performed At: [facility] [place], [place] [id number] Rush Farmer MD [id number]   . Coronavirus Source 07/04/2019 NASOPHARYNGEAL   Final   Performed at [facility], [place] [address], [place], [place] [address]  Hospital Outpatient Visit on 07/04/2019  Component Date Value Ref Range Status  . aPTT 07/04/2019 34  24 - 36 seconds Final   Performed at [facility], [place] [address] [place], [place] [address]  . WBC 07/04/2019 5.3  4.0 - 10.5 K/uL Final  . RBC 07/04/2019 4.66  3.87 - 5.11 MIL/uL Final  . Hemoglobin 07/04/2019 14.8  12.0 - 15.0 g/dL Final  . HCT 07/04/2019 45.2  36.0 - 46.0 % Final  . MCV 07/04/2019 97.0  80.0 - 100.0 fL Final  . MCH 07/04/2019 31.8  26.0 - 34.0 pg Final  . MCHC 07/04/2019 32.7  30.0 - 36.0 g/dL Final  . RDW 07/04/2019 13.0  11.5 - 15.5 % Final  . Platelets 07/04/2019 253  150 - 400 K/uL Final  . nRBC 07/04/2019 0.0  0.0 - 0.2 % Final   Performed at [facility], [place] [address] [place], [place] [address]  . Sodium 07/04/2019 139  135 - 145 mmol/L Final  . Potassium 07/04/2019 3.9  3.5 - 5.1 mmol/L Final  . Chloride 07/04/2019 106  98 - 111 mmol/L Final  . CO2 07/04/2019 21* 22 - 32 mmol/L Final  . Glucose, Bld 07/04/2019 112* 70 - 99 mg/dL Final  . BUN 07/04/2019  18  8 - 23 mg/dL Final  . Creatinine, Ser 07/04/2019 0.61  0.44 - 1.00 mg/dL Final  . Calcium 07/04/2019 9.6  8.9 - 10.3 mg/dL Final  . Total Protein 07/04/2019 6.8  6.5 - 8.1 g/dL Final  . Albumin 07/04/2019 4.6  3.5 - 5.0 g/dL Final  . AST 07/04/2019 17  15 - 41 U/L Final  . ALT 07/04/2019 13  0 - 44 U/L Final  . Alkaline Phosphatase 07/04/2019 75  38 - 126 U/L Final  .  Total Bilirubin 07/04/2019 0.8  0.3 - 1.2 mg/dL Final  . GFR calc non Af Amer 07/04/2019 >60  >60 mL/min Final  . GFR calc Af Amer 07/04/2019 >60  >60 mL/min Final  . Anion gap 07/04/2019 12  5 - 15 Final   Performed at [facility], [place] [address] [place], [place] [address]  . Prothrombin Time 07/04/2019 12.4  11.4 - 15.2 seconds Final  . INR 07/04/2019 0.9  0.8 - 1.2 Final   Comment: (NOTE) INR goal varies based on device and disease states. Performed at [facility] [place] [address] [place], [place] [address]   . ABO/RH(D) 07/04/2019 O POS   Final  . Antibody Screen 07/04/2019 NEG   Final  . Sample Expiration 07/04/2019 07/11/2019,2359   Final  . Extend sample reason 07/04/2019    Final                   Value:NO TRANSFUSIONS OR PREGNANCY IN THE PAST 3 MONTHS Performed at [place] [address], [place], [place] [address]   . Hgb A1c MFr Bld 07/04/2019 5.8* 4.8 - 5.6 % Final   Comment: (NOTE) Pre diabetes:          5.7%-6.4% Diabetes:              >6.4% Glycemic control for   <7.0% adults with diabetes   . Mean Plasma Glucose 07/04/2019 119.76  mg/dL Final   Performed at [place] [address], [place], [place] [address]  . MRSA, PCR 07/04/2019 NEGATIVE  NEGATIVE Final  . Staphylococcus aureus 07/04/2019 NEGATIVE  NEGATIVE Final   Comment: (NOTE) The Xpert SA Assay (FDA approved for NASAL specimens in patients 64 years of age and older), is one component of a comprehensive surveillance program. It is not intended to diagnose  infection nor to guide or monitor treatment. Performed at [facility], [place] [address], [place], [place] [address]   . ABO/RH(D) 07/04/2019    Final                   Value:O POS Performed at [facility], [place] [address] [place], [place] [address]      X-Rays:No results found.  EKG:No orders found for this or any previous visit.   Hospital Course: Jamie Cole is a 78 y.o. who was admitted to [facility]. They were brought to the operating room on 07/08/2019 and underwent Procedure(s): TOTAL KNEE ARTHROPLASTY.  Patient tolerated the procedure well and was later transferred to the recovery room and then to the orthopaedic floor for postoperative care. They were given PO and IV analgesics for pain control following their surgery. They were given 24 hours of postoperative antibiotics of  Anti-infectives (From admission, onward)   Start     Dose/Rate Route Frequency Ordered Stop   07/08/19 1800  ceFAZolin (ANCEF) IVPB 1 g/50 mL premix     1 g 100 mL/hr over 30 Minutes Intravenous Every 6 hours 07/08/19 1358 07/09/19 0059   07/08/19 0915  ceFAZolin (ANCEF) IVPB 2g/100 mL premix     2 g 200 mL/hr over 30 Minutes Intravenous On call to O.R. 07/08/19 0901 07/08/19 1200     and started on DVT prophylaxis in the form of Xarelto.   PT and OT were ordered for total joint protocol. Discharge planning consulted to help with postop disposition and equipment needs.  Patient had a good night  on the evening of surgery. They started to get up OOB with therapy on POD #0. Pt was seen during rounds and was ready to go home pending progress with therapy. Hemovac drain was pulled without difficulty. She worked with therapy on POD #1 and was meeting her goals. Pt was discharged to home later that day in stable condition.  Diet: Diabetic diet Activity: WBAT Follow-up: in 2 weeks Disposition: Home with outpatient therapy at [facility] Discharged Condition:  stable   Discharge Instructions    Call MD / Call 911   Complete by: As directed    If you experience chest pain or shortness of breath, CALL 911 and be transported to the hospital emergency room.  If you develope a fever above 101 F, pus (white drainage) or increased drainage or redness at the wound, or calf pain, call your surgeon's office.   Change dressing   Complete by: As directed    Change dressing on Wednesday, then change the dressing daily with sterile 4 x 4 inch gauze dressing and apply TED hose.   Constipation Prevention   Complete by: As directed    Drink plenty of fluids.  Prune juice may be helpful.  You may use a stool softener, such as Colace (over the counter) 100 mg twice a day.  Use MiraLax (over the counter) for constipation as needed.   Diet - low sodium heart healthy   Complete by: As directed    Discharge instructions   Complete by: As directed    Dr. Gaynelle Arabian Total Joint Specialist [facility] [address], [place], [place] [address] [phone]  TOTAL KNEE REPLACEMENT POSTOPERATIVE DIRECTIONS  Knee Rehabilitation, Guidelines Following Surgery  Results after knee surgery are often greatly improved when you follow the exercise, range of motion and muscle strengthening exercises prescribed by your doctor. Safety measures are also important to protect the knee from further injury. Any time any of these exercises cause you to have increased pain or swelling in your knee joint, decrease the amount until you are comfortable again and slowly increase them. If you have problems or questions, call your caregiver or physical therapist for advice.   HOME CARE INSTRUCTIONS  Remove items at home which could result in a fall. This includes throw rugs or furniture in walking pathways.  ICE to the affected knee every three hours for 30 minutes at a time and then as needed for pain and swelling.  Continue to use ice on the knee for pain and swelling from  surgery. You may notice swelling that will progress down to the foot and ankle.  This is normal after surgery.  Elevate the leg when you are not up walking on it.   Continue to use the breathing machine which will help keep your temperature down.  It is common for your temperature to cycle up and down following surgery, especially at night when you are not up moving around and exerting yourself.  The breathing machine keeps your lungs expanded and your temperature down. Do not place pillow under knee, focus on keeping the knee straight while resting   DIET You may resume your previous home diet once your are discharged from the hospital.  DRESSING / WOUND CARE / SHOWERING You may change your dressing 3-5 days after surgery.  Then change the dressing every day with sterile gauze.  Please use good hand washing techniques before changing the dressing.  Do not use any lotions or creams on the incision until  instructed by your surgeon. You may start showering once you are discharged home but do not submerge the incision under water. Just pat the incision dry and apply a dry gauze dressing on daily. Change the surgical dressing daily and reapply a dry dressing each time.  ACTIVITY Walk with your walker as instructed. Use walker as long as suggested by your caregivers. Avoid periods of inactivity such as sitting longer than an hour when not asleep. This helps prevent blood clots.  You may resume a sexual relationship in one month or when given the OK by your doctor.  You may return to work once you are cleared by your doctor.  Do not drive a car for 6 weeks or until released by you surgeon.  Do not drive while taking narcotics.  WEIGHT BEARING Weight bearing as tolerated with assist device (walker, cane, etc) as directed, use it as long as suggested by your surgeon or therapist, typically at least 4-6 weeks.  POSTOPERATIVE CONSTIPATION PROTOCOL Constipation - defined medically as fewer than three  stools per week and severe constipation as less than one stool per week.  One of the most common issues patients have following surgery is constipation.  Even if you have a regular bowel pattern at home, your normal regimen is likely to be disrupted due to multiple reasons following surgery.  Combination of anesthesia, postoperative narcotics, change in appetite and fluid intake all can affect your bowels.  In order to avoid complications following surgery, here are some recommendations in order to help you during your recovery period.  Colace (docusate) - Pick up an over-the-counter form of Colace or another stool softener and take twice a day as long as you are requiring postoperative pain medications.  Take with a full glass of water daily.  If you experience loose stools or diarrhea, hold the colace until you stool forms back up.  If your symptoms do not get better within 1 week or if they get worse, check with your doctor.  Dulcolax (bisacodyl) - Pick up over-the-counter and take as directed by the product packaging as needed to assist with the movement of your bowels.  Take with a full glass of water.  Use this product as needed if not relieved by Colace only.   MiraLax (polyethylene glycol) - Pick up over-the-counter to have on hand.  MiraLax is a solution that will increase the amount of water in your bowels to assist with bowel movements.  Take as directed and can mix with a glass of water, juice, soda, coffee, or tea.  Take if you go more than two days without a movement. Do not use MiraLax more than once per day. Call your doctor if you are still constipated or irregular after using this medication for 7 days in a row.  If you continue to have problems with postoperative constipation, please contact the office for further assistance and recommendations.  If you experience "the worst abdominal pain ever" or develop nausea or vomiting, please contact the office immediatly for further  recommendations for treatment.  ITCHING  If you experience itching with your medications, try taking only a single pain pill, or even half a pain pill at a time.  You can also use Benadryl over the counter for itching or also to help with sleep.   TED HOSE STOCKINGS Wear the elastic stockings on both legs for three weeks following surgery during the day but you may remove then at night for sleeping.  MEDICATIONS See your medication  summary on the "After Visit Summary" that the nursing staff will review with you prior to discharge.  You may have some home medications which will be placed on hold until you complete the course of blood thinner medication.  It is important for you to complete the blood thinner medication as prescribed by your surgeon.  Continue your approved medications as instructed at time of discharge.  PRECAUTIONS If you experience chest pain or shortness of breath - call 911 immediately for transfer to the hospital emergency department.  If you develop a fever greater that 101 F, purulent drainage from wound, increased redness or drainage from wound, foul odor from the wound/dressing, or calf pain - CONTACT YOUR SURGEON.                                                   FOLLOW-UP APPOINTMENTS Make sure you keep all of your appointments after your operation with your surgeon and caregivers. You should call the office at the above phone number and make an appointment for approximately two weeks after the date of your surgery or on the date instructed by your surgeon outlined in the "After Visit Summary".   RANGE OF MOTION AND STRENGTHENING EXERCISES  Rehabilitation of the knee is important following a knee injury or an operation. After just a few days of immobilization, the muscles of the thigh which control the knee become weakened and shrink (atrophy). Knee exercises are designed to build up the tone and strength of the thigh muscles and to improve knee motion. Often times heat  used for twenty to thirty minutes before working out will loosen up your tissues and help with improving the range of motion but do not use heat for the first two weeks following surgery. These exercises can be done on a training (exercise) mat, on the floor, on a table or on a bed. Use what ever works the best and is most comfortable for you Knee exercises include:  Leg Lifts - While your knee is still immobilized in a splint or cast, you can do straight leg raises. Lift the leg to 60 degrees, hold for 3 sec, and slowly lower the leg. Repeat 10-20 times 2-3 times daily. Perform this exercise against resistance later as your knee gets better.  Quad and Hamstring Sets - Tighten up the muscle on the front of the thigh (Quad) and hold for 5-10 sec. Repeat this 10-20 times hourly. Hamstring sets are done by pushing the foot backward against an object and holding for 5-10 sec. Repeat as with quad sets.  Leg Slides: Lying on your back, slowly slide your foot toward your buttocks, bending your knee up off the floor (only go as far as is comfortable). Then slowly slide your foot back down until your leg is flat on the floor again. Angel Wings: Lying on your back spread your legs to the side as far apart as you can without causing discomfort.  A rehabilitation program following serious knee injuries can speed recovery and prevent re-injury in the future due to weakened muscles. Contact your doctor or a physical therapist for more information on knee rehabilitation.   IF YOU ARE TRANSFERRED TO A SKILLED REHAB FACILITY If the patient is transferred to a skilled rehab facility following release from the hospital, a list of the current medications will be sent to  the facility for the patient to continue.  When discharged from the skilled rehab facility, please have the facility set up the patient's [place] prior to being released. Also, the skilled facility will be responsible for providing the  patient with their medications at time of release from the facility to include their pain medication, the muscle relaxants, and their blood thinner medication. If the patient is still at the rehab facility at time of the two week follow up appointment, the skilled rehab facility will also need to assist the patient in arranging follow up appointment in our office and any transportation needs.  MAKE SURE YOU:  Understand these instructions.  Get help right away if you are not doing well or get worse.    Pick up stool softner and laxative for home use following surgery while on pain medications. Do not submerge incision under water. Please use good hand washing techniques while changing dressing each day. May shower starting three days after surgery. Please use a clean towel to pat the incision dry following showers. Continue to use ice for pain and swelling after surgery. Do not use any lotions or creams on the incision until instructed by your surgeon.   Do not put a pillow under the knee. Place it under the heel.   Complete by: As directed    Driving restrictions   Complete by: As directed    No driving for two weeks   TED hose   Complete by: As directed    Use stockings (TED hose) for three weeks on both leg(s).  You may remove them at night for sleeping.   Weight bearing as tolerated   Complete by: As directed      Allergies as of 07/09/2019      Reactions   Other    PLEASE DO BLOOD PRESSURE IN LEFT ARM ONLY      Medication List    TAKE these medications   acetaminophen 500 MG tablet Commonly known as: TYLENOL Take 500 mg by mouth every 6 (six) hours as needed (for pain.).   Anucort-HC 25 MG suppository Generic drug: hydrocortisone Place 25 mg rectally 2 (two) times daily as needed for hemorrhoids.   CALCIUM 600+D3 PO Take 1 tablet by mouth 3 (three) times daily.   denosumab 60 MG/ML Soln injection Commonly known as: PROLIA Inject 60 mg into the skin every 6 (six)  months. Administer in upper arm, thigh, or abdomen Notes to patient: Take as prescribed   gabapentin 100 MG capsule Commonly known as: NEURONTIN Take 1 capsule (100 mg total) by mouth 3 (three) times daily. Take a 100 mg capsule three times a day for two weeks following surgery.Then take a 100 mg capsule two times a day for two weeks. Then take a 100 mg capsule once a day for two weeks. Then discontinue.   Magnesium Citrate 200 MG Tabs Take 200 mg by mouth 2 (two) times daily.   methocarbamol 500 MG tablet Commonly known as: ROBAXIN Take 1 tablet (500 mg total) by mouth every 6 (six) hours as needed for muscle spasms.   oxyCODONE 5 MG immediate release tablet Commonly known as: Oxy IR/ROXICODONE Take 1-2 tablets (5-10 mg total) by mouth every 6 (six) hours as needed for severe pain.   RESVERATROL PO Take 500 mg by mouth daily.   rivaroxaban 10 MG Tabs tablet Commonly known as: XARELTO Take 1 tablet (10 mg total) by mouth daily for 20 days. Then take one 81 mg  aspirin once a day for three weeks. Then discontinue aspirin.   Synthroid 112 MCG tablet Generic drug: levothyroxine Take 112 mcg by mouth See admin instructions. Take 0.5 tablet (56 mcg) by mouth on Sundays & Wednesdays in the morning, take 1 tablet (112 mcg) by mouth on all other days of the week.   traMADol 50 MG tablet Commonly known as: ULTRAM Take 1-2 tablets (50-100 mg total) by mouth every 6 (six) hours as needed for moderate pain.   vitamin C 1000 MG tablet Take 1,000 mg by mouth daily.   Vitamin D (Ergocalciferol) 1.25 MG (50000 UT) Caps capsule Commonly known as: DRISDOL Take 1 capsule (50,000 Units total) by mouth every 7 (seven) days. What changed: when to take this Notes to patient: Take as prescribed            Discharge Care Instructions  (From admission, onward)         Start     Ordered   07/09/19 0000  Weight bearing as tolerated     12 /08/20 0747   07/09/19 0000  Change dressing     Comments: Change dressing on Wednesday, then change the dressing daily with sterile 4 x 4 inch gauze dressing and apply TED hose.   07/09/19 0747         Follow-up Information    Gaynelle Arabian, MD. Go on 07/23/2019.   Specialty: Orthopedic Surgery Why: You are scheduled for a post-operative appointment on 07-23-19 at 3:45 pm. Contact information: [address] [place] [address] [id number]        Rosilyn Mings.. Go on 07/12/2019.   Why: You are scheduled for a physical therapy appointment on 07-12-19 at 11:00 am with Haynes Hoehn at [place], [address]. Contact information: [place] [address] [phone]           Signed: Theresa Duty, PA-C Orthopedic Surgery 07/10/2019, 8:42 AM

## 2019-07-23 DIAGNOSIS — Z96651 Presence of right artificial knee joint: Secondary | ICD-10-CM | POA: Insufficient documentation

## 2019-08-06 DIAGNOSIS — M25661 Stiffness of right knee, not elsewhere classified: Secondary | ICD-10-CM | POA: Diagnosis not present

## 2019-08-06 DIAGNOSIS — M25561 Pain in right knee: Secondary | ICD-10-CM | POA: Diagnosis not present

## 2019-08-13 DIAGNOSIS — M25561 Pain in right knee: Secondary | ICD-10-CM | POA: Diagnosis not present

## 2019-08-13 DIAGNOSIS — M25661 Stiffness of right knee, not elsewhere classified: Secondary | ICD-10-CM | POA: Diagnosis not present

## 2019-08-16 DIAGNOSIS — M25661 Stiffness of right knee, not elsewhere classified: Secondary | ICD-10-CM | POA: Diagnosis not present

## 2019-08-16 DIAGNOSIS — M25561 Pain in right knee: Secondary | ICD-10-CM | POA: Diagnosis not present

## 2019-08-20 ENCOUNTER — Ambulatory Visit: Payer: Medicare Other | Attending: Internal Medicine

## 2019-08-20 DIAGNOSIS — Z23 Encounter for immunization: Secondary | ICD-10-CM | POA: Insufficient documentation

## 2019-08-20 NOTE — Progress Notes (Signed)
   Covid-19 Vaccination Clinic  Name:  SHANISE KLAUER    MRN: [id number] DOB: 1941-07-26  08/20/2019  Ms. Falco was observed post Covid-19 immunization for 15 minutes without incidence. She was provided with Vaccine Information Sheet and instruction to access the V-Safe system.   Ms. Living was instructed to call 911 with any severe reactions post vaccine: Marland Kitchen Difficulty breathing  . Swelling of your face and throat  . A fast heartbeat  . A bad rash all over your body  . Dizziness and weakness    Immunizations Administered    Name Date Dose VIS Date Route   Pfizer COVID-19 Vaccine 08/20/2019 12:40 PM 0.3 mL 07/12/2019 Intramuscular   Manufacturer: [place]   Lot: [id number]   [place]: [id number]

## 2019-08-21 DIAGNOSIS — M25561 Pain in right knee: Secondary | ICD-10-CM | POA: Diagnosis not present

## 2019-08-21 DIAGNOSIS — M25661 Stiffness of right knee, not elsewhere classified: Secondary | ICD-10-CM | POA: Diagnosis not present

## 2019-08-23 DIAGNOSIS — M25661 Stiffness of right knee, not elsewhere classified: Secondary | ICD-10-CM | POA: Diagnosis not present

## 2019-08-27 DIAGNOSIS — M25661 Stiffness of right knee, not elsewhere classified: Secondary | ICD-10-CM | POA: Diagnosis not present

## 2019-08-27 DIAGNOSIS — M25561 Pain in right knee: Secondary | ICD-10-CM | POA: Diagnosis not present

## 2019-08-28 ENCOUNTER — Telehealth: Payer: Self-pay | Admitting: Oncology

## 2019-08-28 NOTE — Telephone Encounter (Signed)
Returned patient's phone call regarding rescheduling an appointment, left a voicemail. 

## 2019-08-29 ENCOUNTER — Telehealth: Payer: Self-pay | Admitting: Oncology

## 2019-08-29 ENCOUNTER — Other Ambulatory Visit: Payer: Self-pay | Admitting: Oncology

## 2019-08-29 DIAGNOSIS — Z1231 Encounter for screening mammogram for malignant neoplasm of breast: Secondary | ICD-10-CM

## 2019-08-29 NOTE — Telephone Encounter (Signed)
Scheduled appt per 1/25 sch message - pt is aware of new appt date and time

## 2019-08-30 DIAGNOSIS — M25661 Stiffness of right knee, not elsewhere classified: Secondary | ICD-10-CM | POA: Diagnosis not present

## 2019-08-30 DIAGNOSIS — M25561 Pain in right knee: Secondary | ICD-10-CM | POA: Diagnosis not present

## 2019-09-03 DIAGNOSIS — M25661 Stiffness of right knee, not elsewhere classified: Secondary | ICD-10-CM | POA: Diagnosis not present

## 2019-09-03 DIAGNOSIS — M25561 Pain in right knee: Secondary | ICD-10-CM | POA: Diagnosis not present

## 2019-09-05 ENCOUNTER — Other Ambulatory Visit: Payer: Medicare Other

## 2019-09-05 ENCOUNTER — Ambulatory Visit: Payer: Medicare Other | Admitting: Oncology

## 2019-09-05 ENCOUNTER — Ambulatory Visit: Payer: Medicare Other

## 2019-09-05 ENCOUNTER — Ambulatory Visit: Payer: Self-pay

## 2019-09-05 DIAGNOSIS — M25661 Stiffness of right knee, not elsewhere classified: Secondary | ICD-10-CM | POA: Diagnosis not present

## 2019-09-05 DIAGNOSIS — M25561 Pain in right knee: Secondary | ICD-10-CM | POA: Diagnosis not present

## 2019-09-09 ENCOUNTER — Ambulatory Visit: Payer: Medicare Other | Attending: Internal Medicine

## 2019-09-09 DIAGNOSIS — Z23 Encounter for immunization: Secondary | ICD-10-CM | POA: Insufficient documentation

## 2019-09-09 NOTE — Progress Notes (Signed)
   Covid-19 Vaccination Clinic  Name:  Jamie Cole    MRN: [id number] DOB: [birth year]  09/09/2019  [name] was observed post Covid-19 immunization for 15 minutes without incidence. She was provided with Vaccine Information Sheet and instruction to access the V-Safe system.   [name] was instructed to call 911 with any severe reactions post vaccine: Marland Kitchen Difficulty breathing  . Swelling of your face and throat  . A fast heartbeat  . A bad rash all over your body  . Dizziness and weakness    Immunizations Administered    Name Date Dose VIS Date Route   Pfizer COVID-19 Vaccine 09/09/2019 12:15 PM 0.3 mL 07/12/2019 Intramuscular   Manufacturer: [place]   Lot: [id number]   [place]: [id number]

## 2019-09-10 DIAGNOSIS — M25561 Pain in right knee: Secondary | ICD-10-CM | POA: Diagnosis not present

## 2019-09-10 DIAGNOSIS — M25661 Stiffness of right knee, not elsewhere classified: Secondary | ICD-10-CM | POA: Diagnosis not present

## 2019-09-13 DIAGNOSIS — M25661 Stiffness of right knee, not elsewhere classified: Secondary | ICD-10-CM | POA: Diagnosis not present

## 2019-09-17 DIAGNOSIS — M25661 Stiffness of right knee, not elsewhere classified: Secondary | ICD-10-CM | POA: Diagnosis not present

## 2019-10-04 ENCOUNTER — Other Ambulatory Visit: Payer: Self-pay

## 2019-10-04 ENCOUNTER — Ambulatory Visit
Admission: RE | Admit: 2019-10-04 | Discharge: 2019-10-04 | Disposition: A | Discharge status: Home / Self Care | Payer: Medicare Other | Source: Ambulatory Visit | Attending: Oncology | Admitting: Oncology

## 2019-10-04 DIAGNOSIS — Z1231 Encounter for screening mammogram for malignant neoplasm of breast: Secondary | ICD-10-CM

## 2019-10-06 NOTE — Progress Notes (Signed)
[facility]  [phone] [phone]  OFFICE PROGRESS NOTE   ID: Jamie Cole   DOB: 1940-12-31  MR#: [id number]  ELF#:[id number]   Patient Care Team: [name], MD as PCP - General (Internal Medicine) Megan Salon, MD as Consulting Physician (Gynecology) Lilybelle Mayeda, Virgie Dad, MD as Consulting Physician (Oncology) Gaynelle Arabian, MD as Consulting Physician (Orthopedic Surgery) OTHER MD:  CHIEF COMPLAINT: Estrogen receptor positive early stage breast cancer  CURRENT TREATMENT: denosumab/Prolia    INTERVAL HISTORY: Jamie Cole returns today for follow-up of her estrogen receptor positive breast cancer.   She continues on denosumab/Prolia. In the past she had issues with some bone aches for 2 or 3 days after a dose.  Because of the pandemicher most recent dose was received on 08/09/2018  Since her last visit, she underwent bone density screening on 09/03/2018. This showed a T-score of -1.6, which is considered osteopenic and but considerably improved from prior of -3.1 in 08/2016.  She also underwent bilateral diagnostic mammography with tomography at [facility] on 10-18-2019 but this has not yet been read.  She underwent right knee arthroplasty on 07/08/2019 under Dr. Wynelle Link.  She is still doing extensive exercises at home.    REVIEW OF SYSTEMS: Jamie Cole wonders if she should continue to live by herself or moved to a retirement facility.  She had her second shot of COVID-19 on February 8.  She had no significant side effects from that.  She tells me her family is doing fine.  A detailed review of systems today was otherwise noncontributory   BREAST CANCER HISTORY: From Dr. Julien Girt new patient evaluation note dated 09/07/2011:  "This is a delightful 79 year old woman here today with her family for discussion of her recent diagnosis of breast cancer. She does undergo annual screening mammography. She did not take any abnormalities in her breast. A  screening mammogram 08/15/2011 showed a possible mass in the right breast additional views were recommended. Last mammogram right breast ultrasound performed 08/31/2011 showed a suspicious 5 x 6 x 6 mm mass in the upper outer right breast with calcifications and a biopsy was recommended. Biopsy. performed 08/31/2011 demonstrated a invasive ductal cancer likely grade 2 HER-2 was negative the ratio 1.56 the tumor was strongly ER and PR positive. An MRI scans performed to 09/06/11 showed a mass in the right breast measuring 9 x 9 x 8 mm. No other lesions were seen. There is no other evidence of adenopathy. A 1 cm lesion in the left lobe of the liver was seen likely a cyst."    Her subsequent history is as detailed below.    PAST MEDICAL HISTORY: Past Medical History:  Diagnosis Date  . Basal cell carcinoma of neck       . Breast cancer ([facility]) 08/31/11   DCIS; right breast, ER/PR +  . Breast cancer ([facility]) 2/13   9 MM R breast- E+/P+ HVR 2-  . Complication of anesthesia    slow to wake up  . Compressed cervical disc    compression deformity  at T12  . Endometrial hyperplasia, simple   . Endometrial polyp   . Family history of adverse reaction to anesthesia 10/18/91   Mother died during hip surgery  . Fracture    right arm  . H/O TB skin testing 1970's   + INH rx  . Hx of radiation therapy 10/18/11 to 11/18/11   right breast  . Hx of radiation therapy 1957   4 tx  to face for acne  . Hypothyroidism   . Metacarpal bone fracture 08/30/2012   Right 5th finger fracture  . Osteoporosis   . Personal history of radiation therapy     PAST SURGICAL HISTORY: Past Surgical History:  Procedure Laterality Date  . BREAST LUMPECTOMY  2/13   sentinel node  . endometrial polyp    . HYSTEROSCOPY  12/00   polyps  . MASTECTOMY, PARTIAL  09/14/11   right breast  . TONSILLECTOMY     adenoids  . TOTAL KNEE ARTHROPLASTY Right 07/08/2019   Procedure: TOTAL KNEE ARTHROPLASTY;  Surgeon: Gaynelle Arabian, MD;   Location: [facility];  Service: Orthopedics;  Laterality: Right;  69mn    FAMILY HISTORY Family History  Problem Relation Age of Onset  . Skin cancer Father 734 . Stroke Father   . Stomach cancer Paternal Grandmother 758 . Breast cancer Paternal Aunt        x 2  . Breast cancer Cousin        ages 377-60at dx x 4 paternal  . Prostate cancer Cousin        ????  . Brain cancer Maternal Uncle        brain  . Cancer Cousin        tonsillar  . Stroke Brother   . 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 In her spare time the patient enjoys traveling, visiting with friends and family and crocheting.    ADVANCED DIRECTIVES: in place   HEALTH MAINTENANCE: Social History   Tobacco Use  . Smoking status: Never Smoker  . Smokeless tobacco: Never Used  Substance Use Topics  . Alcohol use: No  . Drug use: No     Colonoscopy: 003-17-2013 PAP: 11/2011  Bone density: showed osteopenia with T -1.9   Allergies  Allergen Reactions  . Other     PLEASE DO BLOOD PRESSURE IN LEFT ARM ONLY    Current Outpatient Medications  Medication Sig Dispense Refill  . acetaminophen (TYLENOL) 500 MG tablet Take 500 mg by mouth every 6 (six) hours as needed (for pain.).    .Marland KitchenANUCORT-HC 25 MG suppository Place 25 mg rectally 2 (two) times daily as needed for hemorrhoids.    . Ascorbic Acid (VITAMIN C) 1000 MG tablet Take 1,000 mg by mouth daily.    . Calcium Carb-Cholecalciferol (CALCIUM 600+D3 PO) Take 1 tablet by mouth 3 (three) times daily.    .Marland Kitchendenosumab (PROLIA) 60 MG/ML SOLN injection Inject 60 mg into the skin every 6 (six) months.  Administer in upper arm, thigh, or abdomen    . gabapentin (NEURONTIN) 100 MG capsule Take 1 capsule (100 mg total) by mouth 3 (three) times daily. Take a 100 mg capsule three times a day for two weeks following surgery.Then take a 100 mg capsule two times a day for two weeks. Then take a 100 mg capsule once a day for two weeks. Then discontinue. 84 capsule 0  . levothyroxine (SYNTHROID) 112 MCG tablet Take 112 mcg by mouth See admin instructions. Take 0.5 tablet (56 mcg) by  mouth on Sundays & Wednesdays in the morning, take 1 tablet (112 mcg) by mouth on all other days of the week.    . Magnesium Citrate 200 MG TABS Take 200 mg by mouth 2 (two) times daily.    . methocarbamol (ROBAXIN) 500 MG tablet Take 1 tablet (500 mg total) by mouth every 6 (six) hours as needed for muscle spasms. 40 tablet 0  . oxyCODONE (OXY IR/ROXICODONE) 5 MG immediate release tablet Take 1-2 tablets (5-10 mg total) by mouth every 6 (six) hours as needed for severe pain. 56 tablet 0  . RESVERATROL PO Take 500 mg by mouth daily.    . rivaroxaban (XARELTO) 10 MG TABS tablet Take 1 tablet (10 mg total) by mouth daily for 20 days. Then take one 81 mg aspirin once a day for three weeks. Then discontinue aspirin. 20 tablet 0  . traMADol (ULTRAM) 50 MG tablet Take 1-2 tablets (50-100 mg total) by mouth every 6 (six) hours as needed for moderate pain. 40 tablet 0  . Vitamin D, Ergocalciferol, (DRISDOL) 1.25 MG (50000 UT) CAPS capsule Take 1 capsule (50,000 Units total) by mouth every 7 (seven) days. (Patient taking differently: Take 50,000 Units by mouth every Tuesday. ) 12 capsule 4   No current facility-administered medications for this visit.    OBJECTIVE: Older white woman in no acute distress Vitals:   10/07/19 1107  BP: 138/62  Pulse: 73  Resp: 18  Temp: (!) 97.5 F (36.4 C)  SpO2: 100%     Body mass index is 28.87 kg/m.      ECOG FS: 1 - Symptomatic but completely ambulatory  Sclerae unicteric, EOMs  intact Wearing a mask No cervical or supraclavicular adenopathy Lungs no rales or rhonchi Heart regular rate and rhythm Abd soft, nontender, positive bowel sounds MSK no focal spinal tenderness, no upper extremity lymphedema Neuro: nonfocal, well oriented, appropriate affect Breasts: The right breast has undergone lumpectomy followed by radiation.  There is no evidence of local recurrence.  The left breast is benign.  Both axillae are benign.  LAB RESULTS: Lab Results  Component Value Date   WBC 6.1 10/07/2019   NEUTROABS 3.6 10/07/2019   HGB 14.4 10/07/2019   HCT 44.9 10/07/2019   MCV 93.7 10/07/2019   PLT 279 10/07/2019      Chemistry      Component Value Date/Time   NA 138 10/07/2019 1040   NA 140 03/07/2017 1219   K 4.5 10/07/2019 1040   K 4.0 03/07/2017 1219   CL 105 10/07/2019 1040   CL 106 01/01/2013 0845   CO2 24 10/07/2019 1040   CO2 23 03/07/2017 1219   BUN 16 10/07/2019 1040   BUN 15.8 03/07/2017 1219   CREATININE 0.68 10/07/2019 1040   CREATININE 0.70 03/05/2018 1248   CREATININE 0.6 03/07/2017 1219      Component Value Date/Time   CALCIUM 9.0 10/07/2019 1040   CALCIUM 8.9 03/07/2017 1219   ALKPHOS 114 10/07/2019 1040   ALKPHOS 67 03/07/2017 1219   AST 18 10/07/2019 1040   AST 17 03/05/2018 1248   AST 15 03/07/2017 1219   ALT 14 10/07/2019 1040   ALT 13 03/05/2018 1248   ALT 12 03/07/2017 1219   BILITOT 0.3 10/07/2019 1040   BILITOT 0.4 03/05/2018 1248   BILITOT 0.32 03/07/2017 1219      Lab Results  Component Value Date   LABCA2 18 06/25/2012    Urinalysis    Component Value Date/Time  COLORURINE YELLOW 09/14/2011 1013   APPEARANCEUR CLOUDY (A) 09/14/2011 1013   LABSPEC >1.030 (H) 09/14/2011 1013   PHURINE 5.0 09/14/2011 1013   GLUCOSEU NEGATIVE 09/14/2011 1013   HGBUR NEGATIVE 09/14/2011 1013   BILIRUBINUR SMALL (A) 09/14/2011 1013   KETONESUR 15 (A) 09/14/2011 1013   PROTEINUR NEGATIVE 09/14/2011 1013   UROBILINOGEN 0.2  09/14/2011 1013   NITRITE NEGATIVE 09/14/2011 1013   LEUKOCYTESUR NEGATIVE 09/14/2011 1013    STUDIES: No results found.    ASSESSMENT: 79 y.o. BRCA negative [place], [place] woman:  1.  Status post right breast core biopsy 08/31/2011 showing invasive ductal carcinoma, ER 100%, PR 100%, Ki-67 19%, HER-2/neu by CISH no amplification with a ratio 1.56.  2.  Status post right breast lumpectomy with right axillary sentinel node biopsy 09/14/2011 for a pT1b, pN0, stage IA invasive ductal carcinoma, grade 1, again HER-2/neu by CISH no amplification with ratio of 1.32  3. Status post radiation therapy from 10/18/2011 through 11/18/2011.  4. Started antiestrogen therapy with Arimidex in 10/2011, completing 5 years April 2018  5.  Osteopenia:/ osteoporosis  (a) baseline bone scan January 2014 shows a T score of -1.6  (b)  Bone scan January 2016 shows a T score of -1.9  (c) bone density 08/24/2016 shows a T score of -3.1.   (d) denosumab/Prolia started 09/16/2016, to be repeated every 6 months  (e) repeat bone density 09/03/2018 shows a T score of -1.6   PLAN: Nola is 8 years out from definitive surgery for her breast cancer with no evidence of disease recurrence.  This is very favorable.  We are following her for osteoporosis and she will repeat a Prolia dose today.  She has had a significant improvement and the question is how long do we want to continue with her receiving this drug.  She will be scheduled for a repeat bone density February 2022 and she will see me a year from now.  At that time very likely we will discontinue the Prolia and follow-up here, depending on those results.  She knows to call for any other issue that may develop before the next visit.  Total encounter time 25 minutes.*  Zollie Ellery, Virgie Dad, MD  10/07/19 6:02 PM Medical Oncology and Hematology [facility] [place], [place] [address] Tel. [phone]    Fax.  [phone]    I, Wilburn Mylar, am acting as scribe for Dr. Virgie Dad. Rhythm Gubbels.  I, Lurline Del MD, have reviewed the above documentation for accuracy and completeness, and I agree with the above.    *Total Encounter Time as defined by the Centers for Medicare and Medicaid Services includes, in addition to the face-to-face time of a patient visit (documented in the note above) non-face-to-face time: obtaining and reviewing outside history, ordering and reviewing medications, tests or procedures, care coordination (communications with other health care professionals or caregivers) and documentation in the medical record.

## 2019-10-07 ENCOUNTER — Other Ambulatory Visit: Payer: Self-pay

## 2019-10-07 ENCOUNTER — Telehealth: Payer: Self-pay | Admitting: Oncology

## 2019-10-07 ENCOUNTER — Inpatient Hospital Stay: Payer: Medicare Other

## 2019-10-07 ENCOUNTER — Inpatient Hospital Stay (HOSPITAL_BASED_OUTPATIENT_CLINIC_OR_DEPARTMENT_OTHER): Payer: Medicare Other | Admitting: Oncology

## 2019-10-07 ENCOUNTER — Inpatient Hospital Stay: Payer: Medicare Other | Attending: Oncology

## 2019-10-07 VITALS — BP 138/62 | HR 73 | Temp 97.5°F | Resp 18 | Ht 64.0 in | Wt 168.2 lb

## 2019-10-07 DIAGNOSIS — C50411 Malignant neoplasm of upper-outer quadrant of right female breast: Secondary | ICD-10-CM

## 2019-10-07 DIAGNOSIS — Z853 Personal history of malignant neoplasm of breast: Secondary | ICD-10-CM | POA: Diagnosis not present

## 2019-10-07 DIAGNOSIS — K769 Liver disease, unspecified: Secondary | ICD-10-CM | POA: Diagnosis not present

## 2019-10-07 DIAGNOSIS — Z79899 Other long term (current) drug therapy: Secondary | ICD-10-CM | POA: Diagnosis not present

## 2019-10-07 DIAGNOSIS — Z170 Estrogen receptor positive status [ER+]: Secondary | ICD-10-CM | POA: Diagnosis not present

## 2019-10-07 DIAGNOSIS — M810 Age-related osteoporosis without current pathological fracture: Secondary | ICD-10-CM | POA: Diagnosis not present

## 2019-10-07 DIAGNOSIS — Z923 Personal history of irradiation: Secondary | ICD-10-CM | POA: Diagnosis not present

## 2019-10-07 DIAGNOSIS — M818 Other osteoporosis without current pathological fracture: Secondary | ICD-10-CM

## 2019-10-07 DIAGNOSIS — E039 Hypothyroidism, unspecified: Secondary | ICD-10-CM | POA: Diagnosis not present

## 2019-10-07 DIAGNOSIS — Z85820 Personal history of malignant melanoma of skin: Secondary | ICD-10-CM | POA: Insufficient documentation

## 2019-10-07 DIAGNOSIS — Z7901 Long term (current) use of anticoagulants: Secondary | ICD-10-CM | POA: Diagnosis not present

## 2019-10-07 DIAGNOSIS — T386X5A Adverse effect of antigonadotrophins, antiestrogens, antiandrogens, not elsewhere classified, initial encounter: Secondary | ICD-10-CM

## 2019-10-07 LAB — CBC WITH DIFFERENTIAL/PLATELET
Abs Immature Granulocytes: 0.02 10*3/uL (ref 0.00–0.07)
Basophils Absolute: 0.1 10*3/uL (ref 0.0–0.1)
Basophils Relative: 2 %
Eosinophils Absolute: 0.2 10*3/uL (ref 0.0–0.5)
Eosinophils Relative: 4 %
HCT: 44.9 % (ref 36.0–46.0)
Hemoglobin: 14.4 g/dL (ref 12.0–15.0)
Immature Granulocytes: 0 %
Lymphocytes Relative: 25 %
Lymphs Abs: 1.5 10*3/uL (ref 0.7–4.0)
MCH: 30.1 pg (ref 26.0–34.0)
MCHC: 32.1 g/dL (ref 30.0–36.0)
MCV: 93.7 fL (ref 80.0–100.0)
Monocytes Absolute: 0.7 10*3/uL (ref 0.1–1.0)
Monocytes Relative: 11 %
Neutro Abs: 3.6 10*3/uL (ref 1.7–7.7)
Neutrophils Relative %: 58 %
Platelets: 279 10*3/uL (ref 150–400)
RBC: 4.79 MIL/uL (ref 3.87–5.11)
RDW: 13.4 % (ref 11.5–15.5)
WBC: 6.1 10*3/uL (ref 4.0–10.5)
nRBC: 0 % (ref 0.0–0.2)

## 2019-10-07 LAB — COMPREHENSIVE METABOLIC PANEL
ALT: 14 U/L (ref 0–44)
AST: 18 U/L (ref 15–41)
Albumin: 3.9 g/dL (ref 3.5–5.0)
Alkaline Phosphatase: 114 U/L (ref 38–126)
Anion gap: 9 (ref 5–15)
BUN: 16 mg/dL (ref 8–23)
CO2: 24 mmol/L (ref 22–32)
Calcium: 9 mg/dL (ref 8.9–10.3)
Chloride: 105 mmol/L (ref 98–111)
Creatinine, Ser: 0.68 mg/dL (ref 0.44–1.00)
GFR calc Af Amer: >60 mL/min (ref >60)
GFR calc non Af Amer: >60 mL/min (ref >60)
Glucose, Bld: 105 mg/dL — ABNORMAL HIGH (ref 70–99)
Potassium: 4.5 mmol/L (ref 3.5–5.1)
Sodium: 138 mmol/L (ref 135–145)
Total Bilirubin: 0.3 mg/dL (ref 0.3–1.2)
Total Protein: 6.4 g/dL — ABNORMAL LOW (ref 6.5–8.1)

## 2019-10-07 MED ORDER — DENOSUMAB 60 MG/ML ~~LOC~~ SOSY
60.0000 mg | PREFILLED_SYRINGE | Freq: Once | SUBCUTANEOUS | Status: AC
  Administered 2019-10-07: 12:00:00 60 mg via SUBCUTANEOUS

## 2019-10-07 MED ORDER — DENOSUMAB 60 MG/ML ~~LOC~~ SOSY
PREFILLED_SYRINGE | SUBCUTANEOUS | Status: AC
  Filled 2019-10-07: qty 1

## 2019-10-07 NOTE — Telephone Encounter (Signed)
I talk with patient regarding schedule  

## 2019-10-07 NOTE — Addendum Note (Signed)
Addended by: Chauncey Cruel on: 10/07/2019 06:06 PM   Modules accepted: Orders

## 2019-10-07 NOTE — Patient Instructions (Signed)
Denosumab injection What is this medicine? DENOSUMAB (den oh sue mab) slows bone breakdown. Prolia is used to treat osteoporosis in women after menopause and in men, and in people who are taking corticosteroids for 6 months or more. Xgeva is used to treat a high calcium level due to cancer and to prevent bone fractures and other bone problems caused by multiple myeloma or cancer bone metastases. Xgeva is also used to treat giant cell tumor of the bone. This medicine may be used for other purposes; ask your health care provider or pharmacist if you have questions. COMMON BRAND NAME(S): Prolia, XGEVA What should I tell my health care provider before I take this medicine? They need to know if you have any of these conditions:  dental disease  having surgery or tooth extraction  infection  kidney disease  low levels of calcium or Vitamin D in the blood  malnutrition  on hemodialysis  skin conditions or sensitivity  thyroid or parathyroid disease  an unusual reaction to denosumab, other medicines, foods, dyes, or preservatives  pregnant or trying to get pregnant  breast-feeding How should I use this medicine? This medicine is for injection under the skin. It is given by a health care professional in a hospital or clinic setting. A special MedGuide will be given to you before each treatment. Be sure to read this information carefully each time. For Prolia, talk to your pediatrician regarding the use of this medicine in children. Special care may be needed. For Xgeva, talk to your pediatrician regarding the use of this medicine in children. While this drug may be prescribed for children as young as 13 years for selected conditions, precautions do apply. Overdosage: If you think you have taken too much of this medicine contact a poison control center or emergency room at once. NOTE: This medicine is only for you. Do not share this medicine with others. What if I miss a dose? It is  important not to miss your dose. Call your doctor or health care professional if you are unable to keep an appointment. What may interact with this medicine? Do not take this medicine with any of the following medications:  other medicines containing denosumab This medicine may also interact with the following medications:  medicines that lower your chance of fighting infection  steroid medicines like prednisone or cortisone This list may not describe all possible interactions. Give your health care provider a list of all the medicines, herbs, non-prescription drugs, or dietary supplements you use. Also tell them if you smoke, drink alcohol, or use illegal drugs. Some items may interact with your medicine. What should I watch for while using this medicine? Visit your doctor or health care professional for regular checks on your progress. Your doctor or health care professional may order blood tests and other tests to see how you are doing. Call your doctor or health care professional for advice if you get a fever, chills or sore throat, or other symptoms of a cold or flu. Do not treat yourself. This drug may decrease your body's ability to fight infection. Try to avoid being around people who are sick. You should make sure you get enough calcium and vitamin D while you are taking this medicine, unless your doctor tells you not to. Discuss the foods you eat and the vitamins you take with your health care professional. See your dentist regularly. Brush and floss your teeth as directed. Before you have any dental work done, tell your dentist you are   receiving this medicine. Do not become pregnant while taking this medicine or for 5 months after stopping it. Talk with your doctor or health care professional about your birth control options while taking this medicine. Women should inform their doctor if they wish to become pregnant or think they might be pregnant. There is a potential for serious side  effects to an unborn child. Talk to your health care professional or pharmacist for more information. What side effects may I notice from receiving this medicine? Side effects that you should report to your doctor or health care professional as soon as possible:  allergic reactions like skin rash, itching or hives, swelling of the face, lips, or tongue  bone pain  breathing problems  dizziness  jaw pain, especially after dental work  redness, blistering, peeling of the skin  signs and symptoms of infection like fever or chills; cough; sore throat; pain or trouble passing urine  signs of low calcium like fast heartbeat, muscle cramps or muscle pain; pain, tingling, numbness in the hands or feet; seizures  unusual bleeding or bruising  unusually weak or tired Side effects that usually do not require medical attention (report to your doctor or health care professional if they continue or are bothersome):  constipation  diarrhea  headache  joint pain  loss of appetite  muscle pain  runny nose  tiredness  upset stomach This list may not describe all possible side effects. Call your doctor for medical advice about side effects. You may report side effects to FDA at 1-800-FDA-1088. Where should I keep my medicine? This medicine is only given in a clinic, doctor's office, or other health care setting and will not be stored at home. NOTE: This sheet is a summary. It may not cover all possible information. If you have questions about this medicine, talk to your doctor, pharmacist, or health care provider.  2020 Elsevier/Gold Standard (2017-11-24 16:10:44)

## 2019-11-10 ENCOUNTER — Encounter (HOSPITAL_BASED_OUTPATIENT_CLINIC_OR_DEPARTMENT_OTHER): Payer: Self-pay | Admitting: *Deleted

## 2019-11-10 ENCOUNTER — Emergency Department (HOSPITAL_BASED_OUTPATIENT_CLINIC_OR_DEPARTMENT_OTHER)
Admission: EM | Admit: 2019-11-10 | Discharge: 2019-11-11 | Disposition: A | Discharge status: Home / Self Care | Payer: Medicare Other | Attending: Emergency Medicine | Admitting: Emergency Medicine

## 2019-11-10 ENCOUNTER — Other Ambulatory Visit: Payer: Self-pay

## 2019-11-10 ENCOUNTER — Emergency Department (HOSPITAL_BASED_OUTPATIENT_CLINIC_OR_DEPARTMENT_OTHER): Payer: Medicare Other

## 2019-11-10 DIAGNOSIS — E039 Hypothyroidism, unspecified: Secondary | ICD-10-CM | POA: Diagnosis not present

## 2019-11-10 DIAGNOSIS — R42 Dizziness and giddiness: Secondary | ICD-10-CM | POA: Diagnosis not present

## 2019-11-10 DIAGNOSIS — Z96651 Presence of right artificial knee joint: Secondary | ICD-10-CM | POA: Diagnosis not present

## 2019-11-10 DIAGNOSIS — R112 Nausea with vomiting, unspecified: Secondary | ICD-10-CM | POA: Insufficient documentation

## 2019-11-10 DIAGNOSIS — Z79899 Other long term (current) drug therapy: Secondary | ICD-10-CM | POA: Diagnosis not present

## 2019-11-10 LAB — COMPREHENSIVE METABOLIC PANEL
ALT: 18 U/L (ref 0–44)
AST: 22 U/L (ref 15–41)
Albumin: 4.3 g/dL (ref 3.5–5.0)
Alkaline Phosphatase: 81 U/L (ref 38–126)
Anion gap: 12 (ref 5–15)
BUN: 16 mg/dL (ref 8–23)
CO2: 21 mmol/L — ABNORMAL LOW (ref 22–32)
Calcium: 9 mg/dL (ref 8.9–10.3)
Chloride: 106 mmol/L (ref 98–111)
Creatinine, Ser: 0.56 mg/dL (ref 0.44–1.00)
GFR calc Af Amer: >60 mL/min (ref >60)
GFR calc non Af Amer: >60 mL/min (ref >60)
Glucose, Bld: 156 mg/dL — ABNORMAL HIGH (ref 70–99)
Potassium: 4.1 mmol/L (ref 3.5–5.1)
Sodium: 139 mmol/L (ref 135–145)
Total Bilirubin: 0.6 mg/dL (ref 0.3–1.2)
Total Protein: 6.9 g/dL (ref 6.5–8.1)

## 2019-11-10 LAB — CBC WITH DIFFERENTIAL/PLATELET
Abs Immature Granulocytes: 0.05 10*3/uL (ref 0.00–0.07)
Basophils Absolute: 0.1 10*3/uL (ref 0.0–0.1)
Basophils Relative: 1 %
Eosinophils Absolute: 0 10*3/uL (ref 0.0–0.5)
Eosinophils Relative: 0 %
HCT: 46.6 % — ABNORMAL HIGH (ref 36.0–46.0)
Hemoglobin: 15.4 g/dL — ABNORMAL HIGH (ref 12.0–15.0)
Immature Granulocytes: 1 %
Lymphocytes Relative: 10 %
Lymphs Abs: 0.7 10*3/uL (ref 0.7–4.0)
MCH: 30.8 pg (ref 26.0–34.0)
MCHC: 33 g/dL (ref 30.0–36.0)
MCV: 93.2 fL (ref 80.0–100.0)
Monocytes Absolute: 0.2 10*3/uL (ref 0.1–1.0)
Monocytes Relative: 3 %
Neutro Abs: 6.3 10*3/uL (ref 1.7–7.7)
Neutrophils Relative %: 85 %
Platelets: 289 10*3/uL (ref 150–400)
RBC: 5 MIL/uL (ref 3.87–5.11)
RDW: 13.9 % (ref 11.5–15.5)
WBC: 7.3 10*3/uL (ref 4.0–10.5)
nRBC: 0 % (ref 0.0–0.2)

## 2019-11-10 LAB — TROPONIN I (HIGH SENSITIVITY): Troponin I (High Sensitivity): 8 ng/L (ref <18)

## 2019-11-10 LAB — URINALYSIS, ROUTINE W REFLEX MICROSCOPIC
Bilirubin Urine: NEGATIVE
Glucose, UA: NEGATIVE mg/dL
Hgb urine dipstick: NEGATIVE
Ketones, ur: 40 mg/dL — AB
Leukocytes,Ua: NEGATIVE
Nitrite: NEGATIVE
Protein, ur: NEGATIVE mg/dL
Specific Gravity, Urine: 1.015 (ref 1.005–1.030)
pH: 7.5 (ref 5.0–8.0)

## 2019-11-10 MED ORDER — SODIUM CHLORIDE 0.9 % IV BOLUS
500.0000 mL | Freq: Once | INTRAVENOUS | Status: AC
  Administered 2019-11-10: 23:00:00 500 mL via INTRAVENOUS

## 2019-11-10 MED ORDER — ONDANSETRON HCL 4 MG/2ML IJ SOLN
4.0000 mg | Freq: Once | INTRAMUSCULAR | Status: AC
  Administered 2019-11-10: 4 mg via INTRAVENOUS
  Filled 2019-11-10: qty 2

## 2019-11-10 NOTE — ED Triage Notes (Signed)
Pt reports dizziness when waking up this morning. Reports that she rested all day and then vomited x 1 today. Denies pain, denies confusion. Pt A/O. Ambulatory in triage with walker.

## 2019-11-10 NOTE — ED Provider Notes (Signed)
[facility] EMERGENCY DEPARTMENT Provider Note  CSN: [id number] Arrival date & time: 11/10/19 1921    History Chief Complaint  Patient presents with  . Dizziness    HPI  Jamie Cole is a 79 y.o. female with no significant PMH reports she felt dizzy when she first got out of bed this morning. She had an episode of vertigo several years ago and while she does not describe any room-spining with this episode she does say it feels somewhat similar. She denies any lightheadedness/near syncope. She was able to get around her house with a cane and then walker but felt that her balance was off. She did not have any LOC, no chest pain, SOB, or fever. She began to feel nauseate later in the afternoon and had two episodes of vomiting. She called her son who brought her to the ED for evaluation.    Past Medical History:  Diagnosis Date  . Basal cell carcinoma of neck       . Breast cancer ([place]) 08/31/11   DCIS; right breast, ER/PR +  . Breast cancer ([place]) 2/13   9 MM R breast- E+/P+ HVR 2-  . Complication of anesthesia    slow to wake up  . Compressed cervical disc    compression deformity  at T12  . Endometrial hyperplasia, simple   . Endometrial polyp   . Family history of adverse reaction to anesthesia 11-05-91   Mother died during hip surgery  . Fracture    right arm  . H/O TB skin testing 1970's   + INH rx  . Hx of radiation therapy 10/18/11 to 11/18/11   right breast  . Hx of radiation therapy 1957   4 tx to face for acne  . Hypothyroidism   . Metacarpal bone fracture 08/30/2012   Right 5th finger fracture  . Osteoporosis   . Personal history of radiation therapy     Past Surgical History:  Procedure Laterality Date  . BREAST LUMPECTOMY  2/13   sentinel node  . endometrial polyp    . HYSTEROSCOPY  12/00   polyps  . MASTECTOMY, PARTIAL  09/14/11   right breast  . TONSILLECTOMY     adenoids  . TOTAL KNEE ARTHROPLASTY Right 07/08/2019   Procedure: TOTAL KNEE  ARTHROPLASTY;  Surgeon: Gaynelle Arabian, MD;  Location: [facility];  Service: Orthopedics;  Laterality: Right;  27min    Family History  Problem Relation Age of Onset  . Skin cancer Father 90  . Stroke Father   . Stomach cancer Paternal Grandmother 76  . Breast cancer Paternal Aunt        x 2  . Breast cancer Cousin        ages 98-60 at dx x 4 paternal  . Prostate cancer Cousin        ????  . Brain cancer Maternal Uncle        brain  . Cancer Cousin        tonsillar  . Stroke Brother   . Colon cancer Neg Hx     Social History   Tobacco Use  . Smoking status: Never Smoker  . Smokeless tobacco: Never Used  Substance Use Topics  . Alcohol use: No  . Drug use: No     Home Medications Prior to Admission medications   Medication Sig Start Date End Date Taking? Authorizing Provider  acetaminophen (TYLENOL) 500 MG tablet Take 500 mg by mouth every 6 (six) hours as needed (  for pain.).    [provider]  ANUCORT-HC 25 MG suppository Place 25 mg rectally 2 (two) times daily as needed for hemorrhoids. 12/31/18   [provider]  Ascorbic Acid (VITAMIN C) 1000 MG tablet Take 1,000 mg by mouth daily.    [provider]  Calcium Carb-Cholecalciferol (CALCIUM 600+D3 PO) Take 1 tablet by mouth 3 (three) times daily.    [provider]  denosumab (PROLIA) 60 MG/ML SOLN injection Inject 60 mg into the skin every 6 (six) months. Administer in upper arm, thigh, or abdomen    [provider]  levothyroxine (SYNTHROID) 112 MCG tablet Take 112 mcg by mouth See admin instructions. Take 0.5 tablet (56 mcg) by mouth on Sundays & Wednesdays in the morning, take 1 tablet (112 mcg) by mouth on all other days of the week.    [provider]  Magnesium Citrate 200 MG TABS Take 200 mg by mouth 2 (two) times daily.    [provider]  meclizine (ANTIVERT) 25 MG tablet Take 1 tablet (25 mg total) by mouth 3 (three) times daily as needed for dizziness.  11/11/19   Horton, Barbette Hair, MD  RESVERATROL PO Take 500 mg by mouth daily.    [provider]  Vitamin D, Ergocalciferol, (DRISDOL) 1.25 MG (50000 UT) CAPS capsule Take 1 capsule (50,000 Units total) by mouth every 7 (seven) days. Patient taking differently: Take 50,000 Units by mouth every Tuesday.  12/30/18   Megan Salon, MD     Allergies    Other   Review of Systems   Review of Systems  Constitutional: Negative for fever.  HENT: Negative for congestion and sore throat.   Respiratory: Negative for cough and shortness of breath.   Cardiovascular: Negative for chest pain.  Gastrointestinal: Positive for nausea and vomiting. Negative for abdominal pain and diarrhea.  Genitourinary: Negative for dysuria.  Musculoskeletal: Negative for myalgias.  Skin: Negative for rash.  Neurological: Positive for dizziness. Negative for headaches.  Psychiatric/Behavioral: Negative for behavioral problems.     Physical Exam BP (!) 166/67 (BP Location: Right Arm)   Pulse 78   Temp 98 F (36.7 C) (Oral)   Resp 20   Ht 5\' 4"  (1.626 m)   Wt 76.2 kg   LMP 08/02/1995   SpO2 99%   BMI 28.84 kg/m   Physical Exam Constitutional:      Appearance: Normal appearance.  HENT:     Head: Normocephalic and atraumatic.     Nose: Nose normal.     Mouth/Throat:     Mouth: Mucous membranes are moist.  Eyes:     Extraocular Movements: Extraocular movements intact.     Conjunctiva/sclera: Conjunctivae normal.  Cardiovascular:     Rate and Rhythm: Normal rate.  Pulmonary:     Effort: Pulmonary effort is normal.     Breath sounds: Normal breath sounds.  Abdominal:     General: Abdomen is flat.     Palpations: Abdomen is soft.     Tenderness: There is no abdominal tenderness.  Musculoskeletal:        General: No swelling. Normal range of motion.     Cervical back: Neck supple.  Skin:    General: Skin is warm and dry.  Neurological:     General: No focal deficit present.     Mental  Status: She is alert and oriented to person, place, and time.     Cranial Nerves: No cranial nerve deficit.  Sensory: No sensory deficit.     Motor: No weakness.     Coordination: Coordination normal.     Gait: Gait abnormal (slightly unsteady gait but able to ambulate with minimal assistance).  Psychiatric:        Mood and Affect: Mood normal.      ED Results / Procedures / Treatments   Labs (all labs ordered are listed, but only abnormal results are displayed) Labs Reviewed  COMPREHENSIVE METABOLIC PANEL - Abnormal; Notable for the following components:      Result Value   CO2 21 (*)    Glucose, Bld 156 (*)    All other components within normal limits  CBC WITH DIFFERENTIAL/PLATELET - Abnormal; Notable for the following components:   Hemoglobin 15.4 (*)    HCT 46.6 (*)    All other components within normal limits  URINALYSIS, ROUTINE W REFLEX MICROSCOPIC - Abnormal; Notable for the following components:   Ketones, ur 40 (*)    All other components within normal limits  TROPONIN I (HIGH SENSITIVITY)    EKG EKG Interpretation  Date/Time:  Sunday November 10 2019 19:27:40 EDT Ventricular Rate:  79 PR Interval:  156 QRS Duration: 84 QT Interval:  398 QTC Calculation: 456 R Axis:   -51 Text Interpretation: Normal sinus rhythm Left anterior fascicular block Minimal voltage criteria for LVH, may be normal variant ( R in aVL ) Abnormal ECG No old tracing to compare Confirmed by [facility]  MD, Juanda Crumble [phone]) on 11/10/2019 10:35:48 PM    Radiology CT Head Wo Contrast  Result Date: 11/10/2019 CLINICAL DATA:  Dizziness EXAM: CT HEAD WITHOUT CONTRAST TECHNIQUE: Contiguous axial images were obtained from the base of the skull through the vertex without intravenous contrast. COMPARISON:  CT brain 09/14/2015 FINDINGS: Brain: No acute territorial infarction, hemorrhage, or intracranial mass. Mild atrophy. Mild hypodensity in the white matter consistent with chronic small vessel  ischemic change. Vascular: No hyperdense vessels.  Carotid vascular calcification Skull: Normal. Negative for fracture or focal lesion. Sinuses/Orbits: No acute finding. Other: None IMPRESSION: 1. No CT evidence for acute intracranial abnormality. 2. Atrophy and mild chronic small vessel ischemic change of the white matter Electronically Signed   By: Donavan Foil M.D.   On: 11/10/2019 22:25    Procedures Procedures  Medications Ordered in the ED Medications  ondansetron [facility]) injection 4 mg (4 mg Intravenous Given 11/10/19 2237)  sodium chloride 0.9 % bolus 500 mL ( Intravenous Stopped 11/10/19 2343)     ED Course  I have reviewed the triage vital signs and the nursing notes.  Pertinent labs & imaging results that were available during my care of the patient were reviewed by me and considered in my medical decision making (see chart for details).  Clinical Course as of Nov 10 937  Sun Nov 10, 2019  2251 Patient here with vague dizziness, feeling off balance and nausea. Suspect a peripheral vertigo although her symptoms are not exactly consistent. Will check labs, CT head and give some fluids and antiemetics.    [CS]  2332 Patient feeling better after Zofran, IVF infusing. Labs and head CT unremarkable. Doubt posterior stroke but if not able to ambulate after IVF may require admission for further evaluation. Care of the patient will be signed out to Dr. Dina Rich at the change of shift.    [CS]    Clinical Course User Index [CS] Truddie Hidden, MD    MDM Rules/Calculators/A&P MDM  Final Clinical Impression(s) / ED Diagnoses Final diagnoses:  Dizziness    Rx / DC Orders ED Discharge Orders         Ordered    meclizine (ANTIVERT) 25 MG tablet  3 times daily PRN     11/11/19 0121           Truddie Hidden, MD 11/11/19 [phone]

## 2019-11-11 MED ORDER — MECLIZINE HCL 25 MG PO TABS: 25.0000 mg | ORAL_TABLET | Freq: Three times a day (TID) | ORAL | 0 refills | Status: DC | PRN

## 2019-11-11 NOTE — ED Notes (Signed)
Pt ambulated from bed. Pt reports some minor lightheadedness, but states it is a significant improvement.

## 2019-11-11 NOTE — ED Provider Notes (Signed)
Patient signed out pending fluids and medication for likely vertigo.  Patient received her meds and was able to ambulate and stand at bedside with improvement of her symptoms.  She reports that she would like to go home.  My suspicion at this time for central cause of vertigo.  I have reviewed her labs and she has 40 ketones in her urine and labs appear hemoconcentrated.  Suspect some element of dehydration contributing.  Will discharge home per Dr. Roland Earl discharge instructions.  After history, exam, and medical workup I feel the patient has been appropriately medically screened and is safe for discharge home. Pertinent diagnoses were discussed with the patient. Patient was given return precautions.    Merryl Hacker, MD 11/11/19 [phone]

## 2019-11-20 DIAGNOSIS — D225 Melanocytic nevi of trunk: Secondary | ICD-10-CM | POA: Diagnosis not present

## 2019-11-20 DIAGNOSIS — Z85828 Personal history of other malignant neoplasm of skin: Secondary | ICD-10-CM | POA: Diagnosis not present

## 2019-11-20 DIAGNOSIS — L820 Inflamed seborrheic keratosis: Secondary | ICD-10-CM | POA: Diagnosis not present

## 2019-11-20 DIAGNOSIS — L570 Actinic keratosis: Secondary | ICD-10-CM | POA: Diagnosis not present

## 2019-11-20 DIAGNOSIS — Z86018 Personal history of other benign neoplasm: Secondary | ICD-10-CM | POA: Diagnosis not present

## 2019-11-20 DIAGNOSIS — L578 Other skin changes due to chronic exposure to nonionizing radiation: Secondary | ICD-10-CM | POA: Diagnosis not present

## 2019-11-20 DIAGNOSIS — L814 Other melanin hyperpigmentation: Secondary | ICD-10-CM | POA: Diagnosis not present

## 2019-11-20 DIAGNOSIS — L821 Other seborrheic keratosis: Secondary | ICD-10-CM | POA: Diagnosis not present

## 2019-12-19 DIAGNOSIS — Z96651 Presence of right artificial knee joint: Secondary | ICD-10-CM | POA: Diagnosis not present

## 2019-12-25 DIAGNOSIS — R7302 Impaired glucose tolerance (oral): Secondary | ICD-10-CM | POA: Diagnosis not present

## 2019-12-25 DIAGNOSIS — M818 Other osteoporosis without current pathological fracture: Secondary | ICD-10-CM | POA: Diagnosis not present

## 2019-12-25 DIAGNOSIS — E038 Other specified hypothyroidism: Secondary | ICD-10-CM | POA: Diagnosis not present

## 2019-12-25 DIAGNOSIS — E7800 Pure hypercholesterolemia, unspecified: Secondary | ICD-10-CM | POA: Diagnosis not present

## 2020-01-01 DIAGNOSIS — E038 Other specified hypothyroidism: Secondary | ICD-10-CM | POA: Diagnosis not present

## 2020-01-01 DIAGNOSIS — E669 Obesity, unspecified: Secondary | ICD-10-CM | POA: Diagnosis not present

## 2020-01-01 DIAGNOSIS — E7800 Pure hypercholesterolemia, unspecified: Secondary | ICD-10-CM | POA: Diagnosis not present

## 2020-01-01 DIAGNOSIS — E559 Vitamin D deficiency, unspecified: Secondary | ICD-10-CM | POA: Diagnosis not present

## 2020-01-01 DIAGNOSIS — D0590 Unspecified type of carcinoma in situ of unspecified breast: Secondary | ICD-10-CM | POA: Diagnosis not present

## 2020-01-01 DIAGNOSIS — Z1331 Encounter for screening for depression: Secondary | ICD-10-CM | POA: Diagnosis not present

## 2020-01-01 DIAGNOSIS — Z96651 Presence of right artificial knee joint: Secondary | ICD-10-CM | POA: Diagnosis not present

## 2020-01-01 DIAGNOSIS — R7302 Impaired glucose tolerance (oral): Secondary | ICD-10-CM | POA: Diagnosis not present

## 2020-01-01 DIAGNOSIS — R82998 Other abnormal findings in urine: Secondary | ICD-10-CM | POA: Diagnosis not present

## 2020-01-01 DIAGNOSIS — M818 Other osteoporosis without current pathological fracture: Secondary | ICD-10-CM | POA: Diagnosis not present

## 2020-01-01 DIAGNOSIS — Z0000 Encounter for general adult medical examination without abnormal findings: Secondary | ICD-10-CM | POA: Diagnosis not present

## 2020-01-13 DIAGNOSIS — L570 Actinic keratosis: Secondary | ICD-10-CM | POA: Diagnosis not present

## 2020-01-13 DIAGNOSIS — L820 Inflamed seborrheic keratosis: Secondary | ICD-10-CM | POA: Diagnosis not present

## 2020-01-13 DIAGNOSIS — L821 Other seborrheic keratosis: Secondary | ICD-10-CM | POA: Diagnosis not present

## 2020-01-13 DIAGNOSIS — D229 Melanocytic nevi, unspecified: Secondary | ICD-10-CM | POA: Diagnosis not present

## 2020-01-15 DIAGNOSIS — H52203 Unspecified astigmatism, bilateral: Secondary | ICD-10-CM | POA: Diagnosis not present

## 2020-01-15 DIAGNOSIS — H4323 Crystalline deposits in vitreous body, bilateral: Secondary | ICD-10-CM | POA: Diagnosis not present

## 2020-01-15 DIAGNOSIS — Z961 Presence of intraocular lens: Secondary | ICD-10-CM | POA: Diagnosis not present

## 2020-02-24 DIAGNOSIS — M25561 Pain in right knee: Secondary | ICD-10-CM | POA: Diagnosis not present

## 2020-03-08 ENCOUNTER — Other Ambulatory Visit: Payer: Self-pay | Admitting: Obstetrics & Gynecology

## 2020-03-09 ENCOUNTER — Telehealth: Payer: Self-pay | Admitting: Obstetrics & Gynecology

## 2020-03-09 NOTE — Telephone Encounter (Signed)
Left message for pt to return call to triage RN. 

## 2020-03-09 NOTE — Telephone Encounter (Signed)
Patient's prescription for Vitamin D has been denied. She is scheduled for aex in November.

## 2020-03-10 DIAGNOSIS — Z96651 Presence of right artificial knee joint: Secondary | ICD-10-CM | POA: Diagnosis not present

## 2020-03-10 MED ORDER — VITAMIN D (ERGOCALCIFEROL) 1.25 MG (50000 UNIT) PO CAPS: 50000.0000 [IU] | ORAL_CAPSULE | ORAL | 0 refills | Status: AC

## 2020-03-10 NOTE — Telephone Encounter (Signed)
Please refill her vit d until her appointment with Dr Sabra Heck

## 2020-03-10 NOTE — Telephone Encounter (Signed)
Spoke with pt. Pt states Vit D Rx was denied due to being overdue on her AEX with Dr Sabra Heck. Pt states didn't feel comfortable due to Covid and having total knee replacement.    Pt advised will review with covering provider in office in absence of Dr Sabra Heck to see if needs Vit D level drawn now and to have new Rx written before AEX. Pt agreeable.   Last Vit D Rx 50K/ weekly was written on [id number] with 4 RF. Pt states has used all of refills.   Pt scheduled AEX with Dr Sabra Heck on 04/16/20.  Routing to Dr Talbert Nan as covering provider.

## 2020-03-10 NOTE — Telephone Encounter (Signed)
Patient is returning call.  °

## 2020-03-10 NOTE — Telephone Encounter (Signed)
Left detailed message for pt with updated Rx. Pt to return call if has any questions or concerns.  Rx sent to pharmacy on file. # 5, 0RF to get to AEX appt on 04/16/20. Encounter closed.

## 2020-03-19 ENCOUNTER — Encounter: Payer: Self-pay | Admitting: Genetic Counselor

## 2020-03-25 DIAGNOSIS — M545 Low back pain: Secondary | ICD-10-CM | POA: Diagnosis not present

## 2020-03-25 DIAGNOSIS — M5431 Sciatica, right side: Secondary | ICD-10-CM | POA: Diagnosis not present

## 2020-03-25 DIAGNOSIS — M25551 Pain in right hip: Secondary | ICD-10-CM | POA: Diagnosis not present

## 2020-04-01 DIAGNOSIS — M545 Low back pain: Secondary | ICD-10-CM | POA: Diagnosis not present

## 2020-04-01 DIAGNOSIS — M25551 Pain in right hip: Secondary | ICD-10-CM | POA: Diagnosis not present

## 2020-04-08 ENCOUNTER — Other Ambulatory Visit: Payer: Medicare Other

## 2020-04-08 ENCOUNTER — Ambulatory Visit: Payer: Medicare Other

## 2020-04-10 DIAGNOSIS — Z471 Aftercare following joint replacement surgery: Secondary | ICD-10-CM | POA: Diagnosis not present

## 2020-04-10 DIAGNOSIS — Z96651 Presence of right artificial knee joint: Secondary | ICD-10-CM | POA: Diagnosis not present

## 2020-04-14 NOTE — Progress Notes (Signed)
79 y.o. G23P1001 Widowed White or Caucasian female here for breast and pelvic exam.  I am also following her for history of endometrial polyps and h/o endometrial hyperplasia and history of breast cancer in November 12, 2011.   She had a right total knee replacement in December.  She reports she fell in August. She had swelling with this but that is better.  Then she started having some sciatica about two weeks.  She did have a back x ray.  She was treated with prednisone and it is better.  She is having some numbness in her feet.  Has seen Dr. Ellene Route.    Denies vaginal bleeding.  Patient's last menstrual period was 08/02/1995. post menopausal         Sexually active: No.  H/O STD:  no  Health Maintenance: PCP:  Domenick Gong.  Last wellness appt was this year.  Did blood work at that appt:  yes Vaccines are up to date:  yes Colonoscopy:  November 12, 2011 polyp MMG:  10-08-2019 category a density birads 1:neg BMD:  09-03-2018 on prolia Last pap smear:  08-16-16 neg H/o abnormal pap smear: no     reports that she has never smoked. She has never used smokeless tobacco. She reports that she does not drink alcohol and does not use drugs.  Past Medical History:  Diagnosis Date  . Basal cell carcinoma of neck       . Breast cancer ([place]) 08/31/11   DCIS; right breast, ER/PR +  . Breast cancer ([place]) 2/13   9 MM R breast- E+/P+ HVR 2-  . Complication of anesthesia    slow to wake up  . Compressed cervical disc    compression deformity  at T12  . Endometrial hyperplasia, simple   . Endometrial polyp   . Family history of adverse reaction to anesthesia 1991-11-12   Mother died during hip surgery  . Fracture    right arm  . H/O TB skin testing 1970's   + INH rx  . Hx of radiation therapy 10/18/11 to 11/18/11   right breast  . Hx of radiation therapy 1957   4 tx to face for acne  . Hypothyroidism   . Metacarpal bone fracture 08/30/2012   Right 5th finger fracture  . Osteoporosis   . Personal history of radiation  therapy     Past Surgical History:  Procedure Laterality Date  . BREAST LUMPECTOMY  2/13   sentinel node  . endometrial polyp    . HYSTEROSCOPY  12/00   polyps  . MASTECTOMY, PARTIAL  09/14/11   right breast  . TONSILLECTOMY     adenoids  . TOTAL KNEE ARTHROPLASTY Right 07/08/2019   Procedure: TOTAL KNEE ARTHROPLASTY;  Surgeon: Gaynelle Arabian, MD;  Location: [facility];  Service: Orthopedics;  Laterality: Right;  11min    Current Outpatient Medications  Medication Sig Dispense Refill  . ANUCORT-HC 25 MG suppository Place 25 mg rectally 2 (two) times daily as needed for hemorrhoids.    . Ascorbic Acid (VITAMIN C) 1000 MG tablet Take 1,000 mg by mouth daily.    . Calcium Carb-Cholecalciferol (CALCIUM 600+D3 PO) Take 1 tablet by mouth 3 (three) times daily.    Marland Kitchen denosumab (PROLIA) 60 MG/ML SOLN injection Inject 60 mg into the skin every 6 (six) months. Administer in upper arm, thigh, or abdomen    . levothyroxine (SYNTHROID) 112 MCG tablet Take 112 mcg by mouth See admin instructions. Take 0.5 tablet (56 mcg) by mouth on Sundays &  Wednesdays in the morning, take 1 tablet (112 mcg) by mouth on all other days of the week.    . Magnesium Citrate 200 MG TABS Take 200 mg by mouth 2 (two) times daily.    Marland Kitchen RESVERATROL PO Take 500 mg by mouth daily.    . Vitamin D, Ergocalciferol, (DRISDOL) 1.25 MG (50000 UNIT) CAPS capsule Take 1 capsule (50,000 Units total) by mouth every Tuesday. 5 capsule 0   No current facility-administered medications for this visit.    Family History  Problem Relation Age of Onset  . Skin cancer Father 6  . Stroke Father   . Stomach cancer Paternal Grandmother 68  . Breast cancer Paternal Aunt        x 2  . Breast cancer Cousin        ages 27-60 at dx x 4 paternal  . Prostate cancer Cousin        ????  . Brain cancer Maternal Uncle        brain  . Cancer Cousin        tonsillar  . Stroke Brother   . Colon cancer Neg Hx     Review of Systems   Constitutional: Negative.   HENT: Negative.   Eyes: Negative.   Respiratory: Negative.   Cardiovascular: Negative.   Gastrointestinal: Negative.   Endocrine: Negative.   Genitourinary: Negative.   Musculoskeletal: Negative.   Skin: Negative.   Allergic/Immunologic: Negative.   Neurological: Negative.   Hematological: Negative.   Psychiatric/Behavioral: Negative.     Exam:   BP 114/64   Ht 5' 4.25" (1.632 m)   Wt 178 lb (80.7 kg)   LMP 08/02/1995   BMI 30.32 kg/m   Height: 5' 4.25" (163.2 cm)  General appearance: alert, cooperative and appears stated age Breasts: well healed scars right breast without any new masses/skin changes, LAD, nipple discharge; left breast is normal without masses/skin changes/LAD, nipple discharge Abdomen: soft, non-tender; bowel sounds normal; no masses,  no organomegaly Lymph nodes: Cervical, supraclavicular, and axillary nodes normal.  No abnormal inguinal nodes palpated Neurologic: Grossly normal  Pelvic: External genitalia:  no lesions              Urethra:  normal appearing urethra with no masses, tenderness or lesions              Bartholins and Skenes: normal                 Vagina: normal appearing vagina with normal color and discharge, no lesions              Cervix: no lesions              Pap taken: No. Bimanual Exam:  Uterus:  normal size, contour, position, consistency, mobility, non-tender              Adnexa: normal adnexa and no mass, fullness, tenderness               Rectovaginal: Confirms               Anus:  normal sphincter tone, no lesions  Chaperone, Olene Floss, CMA, was present for exam.  A:  Breast and Pelvic exam H/o stage IA breast cancer ER/PR+, s/p lumpectomy and radiation 2013.  Was on Arimidex for 5 full years. Osteoporosis, on prolia H/o Vit D def H/o simple endometrial hyperplasia 2000 of the uterus  P:   Mammogram guidelines reviewed.  She is doing yearly. pap smear  not indicated No longer needs  colonoscopy screening  Lab work done with Dr. Osborne Casco return annually or prn  In addition to breast and pelvic exam, about 22 minutes were spent in direct conversation, discussing recommendations/follow up and with documentation.

## 2020-04-16 ENCOUNTER — Other Ambulatory Visit: Payer: Self-pay

## 2020-04-16 ENCOUNTER — Encounter: Payer: Self-pay | Admitting: Obstetrics & Gynecology

## 2020-04-16 ENCOUNTER — Ambulatory Visit (INDEPENDENT_AMBULATORY_CARE_PROVIDER_SITE_OTHER): Payer: Medicare Other | Admitting: Obstetrics & Gynecology

## 2020-04-16 VITALS — BP 114/64 | HR 70 | Resp 16 | Ht 64.25 in | Wt 178.0 lb

## 2020-04-16 DIAGNOSIS — N8500 Endometrial hyperplasia, unspecified: Secondary | ICD-10-CM | POA: Diagnosis not present

## 2020-04-16 DIAGNOSIS — Z01419 Encounter for gynecological examination (general) (routine) without abnormal findings: Secondary | ICD-10-CM | POA: Diagnosis not present

## 2020-04-16 DIAGNOSIS — Z9189 Other specified personal risk factors, not elsewhere classified: Secondary | ICD-10-CM | POA: Diagnosis not present

## 2020-04-17 ENCOUNTER — Telehealth: Payer: Self-pay | Admitting: Obstetrics & Gynecology

## 2020-04-17 NOTE — Telephone Encounter (Signed)
Patient wants to know if she needs to have her vit d checked.

## 2020-04-17 NOTE — Telephone Encounter (Signed)
Spoke with patient. She was in for OV on 04/16/20. States she forgot to discuss her Vit D.  Last Vit level was 31.4 per patient, this was drawn by PCP in 12/2019 at her annual wellness exam.  Patient states she is on Vit D 50,000 IU weekly, prescribed by Dr. Sabra Heck, she has 2-3 caps left. Patient is unsure if this is going to be managed by Dr. Sabra Heck or PCP. Advised I will f/u with Dr. Sabra Heck and return call with recommendations. Patient agreeable.   Dr. Sabra Heck -please advise.

## 2020-04-21 NOTE — Telephone Encounter (Signed)
Patient is calling in regards to questions about Vit D and a possible refill for the medication.

## 2020-04-21 NOTE — Telephone Encounter (Signed)
Spoke with patient. Advised waiting for recommendations from Dr. Sabra Heck, will f/u once reviewed. Patient agreeable.   Routing to Dr. Sabra Heck to advise on Vit D recommendations.

## 2020-04-22 MED ORDER — VITAMIN D (ERGOCALCIFEROL) 1.25 MG (50000 UNIT) PO CAPS: 50000.0000 [IU] | ORAL_CAPSULE | ORAL | 3 refills | Status: DC

## 2020-04-22 NOTE — Telephone Encounter (Signed)
I'm happy to write this prescription but maybe her PCP should take over at the next visit if going to be the person doing the blood work.  I can do the prescription until next June when she would have this tested again or she can transition to taking 5000IU daily and this is an OTC dosage.  She can get this at most pharmacies.  Thanks.

## 2020-04-22 NOTE — Telephone Encounter (Signed)
Spoke with pt. Pt given update from Dr Sabra Heck. Pt states would like to continue taking 50K vit D Rx once a week now until June 2022. Pt advised to have follow up with PCP with labs for vit D. Pt agreeable and verbalized understanding.  Encounter closed.  Rx Vit D sent to pharmacy on file. # 12, 3RF.

## 2020-04-24 ENCOUNTER — Other Ambulatory Visit: Payer: Self-pay

## 2020-04-24 ENCOUNTER — Inpatient Hospital Stay: Payer: Medicare Other

## 2020-04-24 ENCOUNTER — Inpatient Hospital Stay: Payer: Medicare Other | Attending: Oncology

## 2020-04-24 VITALS — BP 133/65 | HR 72 | Temp 97.9°F | Resp 18

## 2020-04-24 DIAGNOSIS — Z79899 Other long term (current) drug therapy: Secondary | ICD-10-CM | POA: Insufficient documentation

## 2020-04-24 DIAGNOSIS — Z170 Estrogen receptor positive status [ER+]: Secondary | ICD-10-CM | POA: Insufficient documentation

## 2020-04-24 DIAGNOSIS — C50411 Malignant neoplasm of upper-outer quadrant of right female breast: Secondary | ICD-10-CM | POA: Diagnosis not present

## 2020-04-24 DIAGNOSIS — Z79811 Long term (current) use of aromatase inhibitors: Secondary | ICD-10-CM | POA: Insufficient documentation

## 2020-04-24 DIAGNOSIS — M810 Age-related osteoporosis without current pathological fracture: Secondary | ICD-10-CM | POA: Insufficient documentation

## 2020-04-24 DIAGNOSIS — T386X5A Adverse effect of antigonadotrophins, antiestrogens, antiandrogens, not elsewhere classified, initial encounter: Secondary | ICD-10-CM

## 2020-04-24 DIAGNOSIS — M818 Other osteoporosis without current pathological fracture: Secondary | ICD-10-CM

## 2020-04-24 LAB — COMPREHENSIVE METABOLIC PANEL
ALT: 12 U/L (ref 0–44)
AST: 17 U/L (ref 15–41)
Albumin: 3.6 g/dL (ref 3.5–5.0)
Alkaline Phosphatase: 63 U/L (ref 38–126)
Anion gap: 5 (ref 5–15)
BUN: 18 mg/dL (ref 8–23)
CO2: 27 mmol/L (ref 22–32)
Calcium: 9.1 mg/dL (ref 8.9–10.3)
Chloride: 107 mmol/L (ref 98–111)
Creatinine, Ser: 0.71 mg/dL (ref 0.44–1.00)
GFR calc Af Amer: >60 mL/min (ref >60)
GFR calc non Af Amer: >60 mL/min (ref >60)
Glucose, Bld: 84 mg/dL (ref 70–99)
Potassium: 4.1 mmol/L (ref 3.5–5.1)
Sodium: 139 mmol/L (ref 135–145)
Total Bilirubin: 0.4 mg/dL (ref 0.3–1.2)
Total Protein: 6.3 g/dL — ABNORMAL LOW (ref 6.5–8.1)

## 2020-04-24 LAB — CBC WITH DIFFERENTIAL/PLATELET
Abs Immature Granulocytes: 0.01 10*3/uL (ref 0.00–0.07)
Basophils Absolute: 0.1 10*3/uL (ref 0.0–0.1)
Basophils Relative: 1 %
Eosinophils Absolute: 0.3 10*3/uL (ref 0.0–0.5)
Eosinophils Relative: 4 %
HCT: 42.4 % (ref 36.0–46.0)
Hemoglobin: 14.3 g/dL (ref 12.0–15.0)
Immature Granulocytes: 0 %
Lymphocytes Relative: 26 %
Lymphs Abs: 1.6 10*3/uL (ref 0.7–4.0)
MCH: 31.6 pg (ref 26.0–34.0)
MCHC: 33.7 g/dL (ref 30.0–36.0)
MCV: 93.6 fL (ref 80.0–100.0)
Monocytes Absolute: 0.6 10*3/uL (ref 0.1–1.0)
Monocytes Relative: 10 %
Neutro Abs: 3.4 10*3/uL (ref 1.7–7.7)
Neutrophils Relative %: 59 %
Platelets: 236 10*3/uL (ref 150–400)
RBC: 4.53 MIL/uL (ref 3.87–5.11)
RDW: 13.8 % (ref 11.5–15.5)
WBC: 5.9 10*3/uL (ref 4.0–10.5)
nRBC: 0 % (ref 0.0–0.2)

## 2020-04-24 MED ORDER — DENOSUMAB 60 MG/ML ~~LOC~~ SOSY
60.0000 mg | PREFILLED_SYRINGE | Freq: Once | SUBCUTANEOUS | Status: AC
  Administered 2020-04-24: 60 mg via SUBCUTANEOUS

## 2020-04-24 MED ORDER — DENOSUMAB 60 MG/ML ~~LOC~~ SOSY
PREFILLED_SYRINGE | SUBCUTANEOUS | Status: AC
  Filled 2020-04-24: qty 1

## 2020-04-24 NOTE — Patient Instructions (Signed)
Denosumab injection °What is this medicine? °DENOSUMAB (den oh sue mab) slows bone breakdown. Prolia is used to treat osteoporosis in women after menopause and in men, and in people who are taking corticosteroids for 6 months or more. Xgeva is used to treat a high calcium level due to cancer and to prevent bone fractures and other bone problems caused by multiple myeloma or cancer bone metastases. Xgeva is also used to treat giant cell tumor of the bone. °This medicine may be used for other purposes; ask your health care provider or pharmacist if you have questions. °COMMON BRAND NAME(S): Prolia, XGEVA °What should I tell my health care provider before I take this medicine? °They need to know if you have any of these conditions: °· dental disease °· having surgery or tooth extraction °· infection °· kidney disease °· low levels of calcium or Vitamin D in the blood °· malnutrition °· on hemodialysis °· skin conditions or sensitivity °· thyroid or parathyroid disease °· an unusual reaction to denosumab, other medicines, foods, dyes, or preservatives °· pregnant or trying to get pregnant °· breast-feeding °How should I use this medicine? °This medicine is for injection under the skin. It is given by a health care professional in a hospital or clinic setting. °A special MedGuide will be given to you before each treatment. Be sure to read this information carefully each time. °For Prolia, talk to your pediatrician regarding the use of this medicine in children. Special care may be needed. For Xgeva, talk to your pediatrician regarding the use of this medicine in children. While this drug may be prescribed for children as young as 13 years for selected conditions, precautions do apply. °Overdosage: If you think you have taken too much of this medicine contact a poison control center or emergency room at once. °NOTE: This medicine is only for you. Do not share this medicine with others. °What if I miss a dose? °It is  important not to miss your dose. Call your doctor or health care professional if you are unable to keep an appointment. °What may interact with this medicine? °Do not take this medicine with any of the following medications: °· other medicines containing denosumab °This medicine may also interact with the following medications: °· medicines that lower your chance of fighting infection °· steroid medicines like prednisone or cortisone °This list may not describe all possible interactions. Give your health care provider a list of all the medicines, herbs, non-prescription drugs, or dietary supplements you use. Also tell them if you smoke, drink alcohol, or use illegal drugs. Some items may interact with your medicine. °What should I watch for while using this medicine? °Visit your doctor or health care professional for regular checks on your progress. Your doctor or health care professional may order blood tests and other tests to see how you are doing. °Call your doctor or health care professional for advice if you get a fever, chills or sore throat, or other symptoms of a cold or flu. Do not treat yourself. This drug may decrease your body's ability to fight infection. Try to avoid being around people who are sick. °You should make sure you get enough calcium and vitamin D while you are taking this medicine, unless your doctor tells you not to. Discuss the foods you eat and the vitamins you take with your health care professional. °See your dentist regularly. Brush and floss your teeth as directed. Before you have any dental work done, tell your dentist you are   receiving this medicine. Do not become pregnant while taking this medicine or for 5 months after stopping it. Talk with your doctor or health care professional about your birth control options while taking this medicine. Women should inform their doctor if they wish to become pregnant or think they might be pregnant. There is a potential for serious side  effects to an unborn child. Talk to your health care professional or pharmacist for more information. What side effects may I notice from receiving this medicine? Side effects that you should report to your doctor or health care professional as soon as possible:  allergic reactions like skin rash, itching or hives, swelling of the face, lips, or tongue  bone pain  breathing problems  dizziness  jaw pain, especially after dental work  redness, blistering, peeling of the skin  signs and symptoms of infection like fever or chills; cough; sore throat; pain or trouble passing urine  signs of low calcium like fast heartbeat, muscle cramps or muscle pain; pain, tingling, numbness in the hands or feet; seizures  unusual bleeding or bruising  unusually weak or tired Side effects that usually do not require medical attention (report to your doctor or health care professional if they continue or are bothersome):  constipation  diarrhea  headache  joint pain  loss of appetite  muscle pain  runny nose  tiredness  upset stomach This list may not describe all possible side effects. Call your doctor for medical advice about side effects. You may report side effects to FDA at 1-800-FDA-1088. Where should I keep my medicine? This medicine is only given in a clinic, doctor's office, or other health care setting and will not be stored at home. NOTE: This sheet is a summary. It may not cover all possible information. If you have questions about this medicine, talk to your doctor, pharmacist, or health care provider.  2020 Elsevier/Gold Standard (2017-11-24 16:10:44)

## 2020-04-29 ENCOUNTER — Ambulatory Visit: Payer: Medicare Other

## 2020-04-29 ENCOUNTER — Other Ambulatory Visit: Payer: Medicare Other

## 2020-05-08 DIAGNOSIS — Z23 Encounter for immunization: Secondary | ICD-10-CM | POA: Diagnosis not present

## 2020-05-15 DIAGNOSIS — M4316 Spondylolisthesis, lumbar region: Secondary | ICD-10-CM | POA: Diagnosis not present

## 2020-05-19 DIAGNOSIS — Z23 Encounter for immunization: Secondary | ICD-10-CM | POA: Diagnosis not present

## 2020-06-01 ENCOUNTER — Ambulatory Visit: Payer: Medicare Other | Admitting: Obstetrics & Gynecology

## 2020-06-29 ENCOUNTER — Other Ambulatory Visit: Payer: Self-pay

## 2020-06-29 ENCOUNTER — Emergency Department (HOSPITAL_COMMUNITY)
Admission: EM | Admit: 2020-06-29 | Discharge: 2020-06-29 | Disposition: A | Discharge status: Home / Self Care | Payer: Medicare Other | Attending: Emergency Medicine | Admitting: Emergency Medicine

## 2020-06-29 ENCOUNTER — Encounter (HOSPITAL_COMMUNITY): Payer: Self-pay

## 2020-06-29 DIAGNOSIS — M79671 Pain in right foot: Secondary | ICD-10-CM | POA: Diagnosis not present

## 2020-06-29 DIAGNOSIS — S8002XA Contusion of left knee, initial encounter: Secondary | ICD-10-CM | POA: Diagnosis not present

## 2020-06-29 DIAGNOSIS — Z96651 Presence of right artificial knee joint: Secondary | ICD-10-CM | POA: Diagnosis not present

## 2020-06-29 DIAGNOSIS — Z853 Personal history of malignant neoplasm of breast: Secondary | ICD-10-CM | POA: Diagnosis not present

## 2020-06-29 DIAGNOSIS — S0102XA Laceration with foreign body of scalp, initial encounter: Secondary | ICD-10-CM | POA: Insufficient documentation

## 2020-06-29 DIAGNOSIS — M25562 Pain in left knee: Secondary | ICD-10-CM | POA: Diagnosis not present

## 2020-06-29 DIAGNOSIS — E039 Hypothyroidism, unspecified: Secondary | ICD-10-CM | POA: Insufficient documentation

## 2020-06-29 DIAGNOSIS — Y92002 Bathroom of unspecified non-institutional (private) residence single-family (private) house as the place of occurrence of the external cause: Secondary | ICD-10-CM | POA: Insufficient documentation

## 2020-06-29 DIAGNOSIS — S0101XA Laceration without foreign body of scalp, initial encounter: Secondary | ICD-10-CM

## 2020-06-29 DIAGNOSIS — S92351A Displaced fracture of fifth metatarsal bone, right foot, initial encounter for closed fracture: Secondary | ICD-10-CM | POA: Diagnosis not present

## 2020-06-29 DIAGNOSIS — W01198A Fall on same level from slipping, tripping and stumbling with subsequent striking against other object, initial encounter: Secondary | ICD-10-CM | POA: Diagnosis not present

## 2020-06-29 DIAGNOSIS — S0990XA Unspecified injury of head, initial encounter: Secondary | ICD-10-CM | POA: Diagnosis present

## 2020-06-29 MED ORDER — LIDOCAINE-EPINEPHRINE 2 %-1:100000 IJ SOLN
30.0000 mL | Freq: Once | INTRAMUSCULAR | Status: AC
  Administered 2020-06-29: 30 mL
  Filled 2020-06-29 (×2): qty 30

## 2020-06-29 NOTE — ED Provider Notes (Signed)
[facility] DEPT Provider Note   CSN: [id number] Arrival date & time: 06/29/20  11-01-37     History Chief Complaint  Patient presents with  . Fall    Jamie Cole is a 79 y.o. female.  The history is provided by the patient. No language interpreter was used.  Fall This is a new problem. The current episode started 6 to 12 hours ago. The problem occurs constantly. The problem has not changed since onset.Pertinent negatives include no chest pain and no abdominal pain. Nothing aggravates the symptoms. Nothing relieves the symptoms. She has tried nothing for the symptoms. The treatment provided no relief.   Pt reports she fell and broke her foot and hit her head.  Pt saw her Orthopaedist and was placed in a boot.  Pt was told to come here for repair of scalp laceration.  Pt denies any loss of consciousness.    Pt reports she feels normal.  No dizziness. No visual change  Past Medical History:  Diagnosis Date  . Basal cell carcinoma of neck       . Breast cancer ([place]) 08/31/2011   DCIS; right breast, ER/PR +, Her 2 neg  . Complication of anesthesia    slow to wake up  . Compressed cervical disc    compression deformity  at T12  . Endometrial hyperplasia, simple   . Endometrial polyp   . Family history of adverse reaction to anesthesia 02-Nov-1991   Mother died during hip surgery  . Fracture    right arm  . H/O TB skin testing 1970's   + INH rx  . Hx of radiation therapy 10/18/11 to 11/18/11   right breast  . Hx of radiation therapy 1957   4 tx to face for acne  . Hypothyroidism   . Metacarpal bone fracture 08/30/2012   Right 5th finger fracture  . Osteoporosis   . Personal history of radiation therapy     Patient Active Problem List   Diagnosis Date Noted  . History of total knee replacement, right 07/23/2019  . OA (osteoarthritis) of knee 07/08/2019  . Osteoporosis due to aromatase inhibitor 08/29/2016  . Shingles 04/17/2015  . Malignant  neoplasm of upper-outer quadrant of right breast in female, estrogen receptor positive ([place]) 08/22/2013  . Hx of radiation therapy   . Acquired hypothyroidism 09/07/2011    Past Surgical History:  Procedure Laterality Date  . BREAST LUMPECTOMY  2/13   sentinel node  . endometrial polyp    . HYSTEROSCOPY  12/00   polyps  . MASTECTOMY, PARTIAL  09/14/11   right breast  . TONSILLECTOMY     adenoids  . TOTAL KNEE ARTHROPLASTY Right 07/08/2019   Procedure: TOTAL KNEE ARTHROPLASTY;  Surgeon: Gaynelle Arabian, MD;  Location: [facility] ORS;  Service: Orthopedics;  Laterality: Right;  74min     OB History    Gravida  1   Para  1   Term  1   Preterm      AB      Living  1     SAB      TAB      Ectopic      Multiple      Live Births  1        Obstetric Comments  Menarche age 69, menopause 64, HRT x 2-3 yrs        Family History  Problem Relation Age of Onset  . Skin cancer Father 66  . Stroke  Father   . Stomach cancer Paternal Grandmother 89  . Breast cancer Paternal Aunt        x 2  . Breast cancer Cousin        ages 64-60 at dx x 4 paternal  . Prostate cancer Cousin        ????  . Brain cancer Maternal Uncle        brain  . Cancer Cousin        tonsillar  . Stroke Brother   . Colon cancer Neg Hx     Social History   Tobacco Use  . Smoking status: Never Smoker  . Smokeless tobacco: Never Used  Vaping Use  . Vaping Use: Never used  Substance Use Topics  . Alcohol use: No  . Drug use: No    Home Medications Prior to Admission medications   Medication Sig Start Date End Date Taking? Authorizing Provider  ANUCORT-HC 25 MG suppository Place 25 mg rectally 2 (two) times daily as needed for hemorrhoids. 12/31/18   [provider]  Ascorbic Acid (VITAMIN C) 1000 MG tablet Take 1,000 mg by mouth daily.    [provider]  Calcium Carb-Cholecalciferol (CALCIUM 600+D3 PO) Take 1 tablet by mouth 3 (three) times daily.    [provider]   denosumab (PROLIA) 60 MG/ML SOLN injection Inject 60 mg into the skin every 6 (six) months. Administer in upper arm, thigh, or abdomen    [provider]  levothyroxine (SYNTHROID) 112 MCG tablet Take 112 mcg by mouth See admin instructions. Take 0.5 tablet (56 mcg) by mouth on Sundays & Wednesdays in the morning, take 1 tablet (112 mcg) by mouth on all other days of the week.    [provider]  Magnesium Citrate 200 MG TABS Take 200 mg by mouth 2 (two) times daily.    [provider]  RESVERATROL PO Take 500 mg by mouth daily.    [provider]  Vitamin D, Ergocalciferol, (DRISDOL) 1.25 MG (50000 UNIT) CAPS capsule Take 1 capsule (50,000 Units total) by mouth every 7 (seven) days. 04/22/20   Megan Salon, MD    Allergies    Other  Review of Systems   Review of Systems  Cardiovascular: Negative for chest pain.  Gastrointestinal: Negative for abdominal pain.  All other systems reviewed and are negative.   Physical Exam Updated Vital Signs BP (!) 195/74 (BP Location: Left Arm)   Pulse (!) 109   Temp 98.1 F (36.7 C) (Oral)   Resp 18   LMP 08/02/1995   SpO2 100%   Physical Exam Vitals and nursing note reviewed.  Constitutional:      Appearance: She is well-developed.  HENT:     Head: Normocephalic.  Cardiovascular:     Rate and Rhythm: Normal rate and regular rhythm.     Pulses: Normal pulses.     Heart sounds: Normal heart sounds.  Pulmonary:     Effort: Pulmonary effort is normal.  Abdominal:     General: Abdomen is flat. There is no distension.  Musculoskeletal:        General: Normal range of motion.     Cervical back: Normal range of motion.  Skin:    General: Skin is warm.  Neurological:     General: No focal deficit present.     Mental Status: She is alert and oriented to person, place, and time.  Psychiatric:        Mood and Affect: Mood normal.  ED Results / Procedures / Treatments   Labs (all labs ordered are  listed, but only abnormal results are displayed) Labs Reviewed - No data to display  EKG None  Radiology No results found.  Procedures .Marland KitchenLaceration Repair  Date/Time: 06/29/2020 10:52 PM Performed by: Fransico Meadow, PA-C Authorized by: Fransico Meadow, PA-C   Consent:    Consent obtained:  Verbal   Consent given by:  Patient   Risks discussed:  Pain   Alternatives discussed:  No treatment Laceration details:    Length (cm):  1 Repair type:    Repair type:  Simple Pre-procedure details:    Preparation:  Patient was prepped and draped in usual sterile fashion Treatment:    Area cleansed with:  Shur-Clens   Amount of cleaning:  Standard   Irrigation solution:  Sterile saline   Visualized foreign bodies/material removed: no   Skin repair:    Repair method:  Tissue adhesive Post-procedure details:    Patient tolerance of procedure:  Tolerated well, no immediate complications   (including critical care time)  Medications Ordered in ED Medications  lidocaine-EPINEPHrine (XYLOCAINE W/EPI) 2 %-1:100000 (with pres) injection 30 mL (30 mLs Infiltration Given 06/29/20 2232)    ED Course  I have reviewed the triage vital signs and the nursing notes.  Pertinent labs & imaging results that were available during my care of the patient were reviewed by me and considered in my medical decision making (see chart for details).    MDM Rules/Calculators/A&P                          MDM: Pt advised staple removal in 8 days.  Pt will follow up with Orthopaedist for evaltuion  Final Clinical Impression(s) / ED Diagnoses Final diagnoses:  Laceration of scalp, initial encounter    Rx / DC Orders ED Discharge Orders    None    An After Visit Summary was printed and given to the patient.    Fransico Meadow, Hershal Coria 06/29/20 2253    Charlesetta Shanks, MD 07/10/20 [phone]

## 2020-06-29 NOTE — Discharge Instructions (Signed)
Staple removal in 8 days.  Have your Physician recheck your blood pressure

## 2020-06-29 NOTE — ED Triage Notes (Signed)
Patient arrived stating tonight at 630 she slipped in the bathroom and hit her head against the door. Roughly 1 inch laceration to the top of her head. Bleeding controlled. No LOC

## 2020-07-08 DIAGNOSIS — E039 Hypothyroidism, unspecified: Secondary | ICD-10-CM | POA: Diagnosis not present

## 2020-07-08 DIAGNOSIS — C50411 Malignant neoplasm of upper-outer quadrant of right female breast: Secondary | ICD-10-CM | POA: Diagnosis not present

## 2020-07-08 DIAGNOSIS — R7302 Impaired glucose tolerance (oral): Secondary | ICD-10-CM | POA: Diagnosis not present

## 2020-07-08 DIAGNOSIS — E7800 Pure hypercholesterolemia, unspecified: Secondary | ICD-10-CM | POA: Diagnosis not present

## 2020-07-08 DIAGNOSIS — M160 Bilateral primary osteoarthritis of hip: Secondary | ICD-10-CM | POA: Diagnosis not present

## 2020-07-08 DIAGNOSIS — Z4802 Encounter for removal of sutures: Secondary | ICD-10-CM | POA: Diagnosis not present

## 2020-07-08 DIAGNOSIS — Z96651 Presence of right artificial knee joint: Secondary | ICD-10-CM | POA: Diagnosis not present

## 2020-07-08 DIAGNOSIS — M818 Other osteoporosis without current pathological fracture: Secondary | ICD-10-CM | POA: Diagnosis not present

## 2020-07-08 DIAGNOSIS — E559 Vitamin D deficiency, unspecified: Secondary | ICD-10-CM | POA: Diagnosis not present

## 2020-07-08 DIAGNOSIS — E669 Obesity, unspecified: Secondary | ICD-10-CM | POA: Diagnosis not present

## 2020-07-08 DIAGNOSIS — Z170 Estrogen receptor positive status [ER+]: Secondary | ICD-10-CM | POA: Diagnosis not present

## 2020-07-10 DIAGNOSIS — M79671 Pain in right foot: Secondary | ICD-10-CM | POA: Diagnosis not present

## 2020-07-10 DIAGNOSIS — M25562 Pain in left knee: Secondary | ICD-10-CM | POA: Diagnosis not present

## 2020-07-16 DIAGNOSIS — Z96651 Presence of right artificial knee joint: Secondary | ICD-10-CM | POA: Diagnosis not present

## 2020-07-27 DIAGNOSIS — S92351D Displaced fracture of fifth metatarsal bone, right foot, subsequent encounter for fracture with routine healing: Secondary | ICD-10-CM | POA: Diagnosis not present

## 2020-08-05 DIAGNOSIS — L219 Seborrheic dermatitis, unspecified: Secondary | ICD-10-CM | POA: Diagnosis not present

## 2020-08-05 DIAGNOSIS — L309 Dermatitis, unspecified: Secondary | ICD-10-CM | POA: Diagnosis not present

## 2020-08-26 DIAGNOSIS — S92351D Displaced fracture of fifth metatarsal bone, right foot, subsequent encounter for fracture with routine healing: Secondary | ICD-10-CM | POA: Diagnosis not present

## 2020-09-01 ENCOUNTER — Other Ambulatory Visit: Payer: Self-pay | Admitting: Oncology

## 2020-09-01 DIAGNOSIS — Z1231 Encounter for screening mammogram for malignant neoplasm of breast: Secondary | ICD-10-CM

## 2020-09-23 DIAGNOSIS — S92351D Displaced fracture of fifth metatarsal bone, right foot, subsequent encounter for fracture with routine healing: Secondary | ICD-10-CM | POA: Diagnosis not present

## 2020-09-26 DIAGNOSIS — L03011 Cellulitis of right finger: Secondary | ICD-10-CM | POA: Diagnosis not present

## 2020-09-26 DIAGNOSIS — L089 Local infection of the skin and subcutaneous tissue, unspecified: Secondary | ICD-10-CM | POA: Diagnosis not present

## 2020-09-26 DIAGNOSIS — L03012 Cellulitis of left finger: Secondary | ICD-10-CM | POA: Diagnosis not present

## 2020-10-06 ENCOUNTER — Ambulatory Visit: Payer: Medicare Other

## 2020-10-06 ENCOUNTER — Ambulatory Visit: Payer: Medicare Other | Admitting: Oncology

## 2020-10-06 ENCOUNTER — Other Ambulatory Visit: Payer: Medicare Other

## 2020-10-14 ENCOUNTER — Other Ambulatory Visit: Payer: Self-pay

## 2020-10-14 ENCOUNTER — Ambulatory Visit
Admission: RE | Admit: 2020-10-14 | Discharge: 2020-10-14 | Disposition: A | Discharge status: Home / Self Care | Payer: Medicare Other | Source: Ambulatory Visit | Attending: Oncology | Admitting: Oncology

## 2020-10-14 DIAGNOSIS — Z1231 Encounter for screening mammogram for malignant neoplasm of breast: Secondary | ICD-10-CM

## 2020-10-21 DIAGNOSIS — S92351D Displaced fracture of fifth metatarsal bone, right foot, subsequent encounter for fracture with routine healing: Secondary | ICD-10-CM | POA: Diagnosis not present

## 2020-10-21 NOTE — Progress Notes (Signed)
[place]  [phone] [phone]  OFFICE PROGRESS NOTE   ID: Jamie Cole   DOB: [birth year]  MR#: [id number]  GQB#:[id number]   Patient Care Team: Jamie Pao, MD as PCP - General (Internal Medicine) Jamie Salon, MD as Consulting Physician (Gynecology) Jamie Cole, Jamie Dad, MD as Consulting Physician (Oncology) Jamie Arabian, MD as Consulting Physician (Orthopedic Surgery) OTHER MD:  CHIEF COMPLAINT: Estrogen receptor positive early stage breast cancer  CURRENT TREATMENT: denosumab/Prolia    INTERVAL HISTORY: Jamie Cole returns today for follow-up of her estrogen receptor positive breast cancer.   She continues on denosumab/Prolia.  She has had no problems with this medication.  She gets likely her final dose today.  Since her last visit, she underwent bilateral screening mammography with tomography at [facility] on 10/14/2020 showing: breast density category B; no evidence of malignancy Jamie either breast.  She had been scheduled I thought for a DEXA scan at the same time but that was not done for some reason.    REVIEW OF SYSTEMS: Jamie Cole is generally doing fine.  She did injure her foot and just came off a boot.  She is continuing to rehab the foot.  A detailed review of systems was otherwise stable   COVID 19 VACCINATION STATUS: Jamie Cole x2, most recently 09/2019   BREAST CANCER HISTORY: From Jamie Cole new patient evaluation note dated 09/07/2011:  "This is a delightful 80 year old woman here today with her family for discussion of her recent diagnosis of breast cancer. She does undergo annual screening mammography. She did not take any abnormalities Jamie her breast. A screening mammogram 08/15/2011 showed a possible mass Jamie the right breast additional views were recommended. Last mammogram right breast ultrasound performed 08/31/2011 showed a suspicious 5 x 6 x 6 mm mass Jamie the upper outer right breast with calcifications and a biopsy  was recommended. Biopsy. performed 08/31/2011 demonstrated a invasive ductal cancer likely grade 2 HER-2 was negative the ratio 1.56 the tumor was strongly ER and PR positive. An MRI scans performed to 09/06/11 showed a mass Jamie the right breast measuring 9 x 9 x 8 mm. No other lesions were seen. There is no other evidence of adenopathy. A 1 cm lesion Jamie the left lobe of the liver was seen likely a cyst."    Her subsequent history is as detailed below.    PAST MEDICAL HISTORY: Past Medical History:  Diagnosis Date  . Basal cell carcinoma of neck       . Breast cancer ([place]) 08/31/2011   DCIS; right breast, ER/PR +, Her 2 neg  . Complication of anesthesia    slow to wake up  . Compressed cervical disc    compression deformity  at T12  . Endometrial hyperplasia, simple   . Endometrial polyp   . Family history of adverse reaction to anesthesia 10-19-1991   Mother died during hip surgery  . Fracture    right arm  . H/O TB skin testing 1970's   + INH rx  . Hx of radiation therapy 10/18/11 to 11/18/11   right breast  . Hx of radiation therapy 1957   4 tx to face for acne  . Hypothyroidism   . Metacarpal bone fracture 08/30/2012   Right 5th finger fracture  . Osteoporosis   . Personal history of radiation therapy     PAST SURGICAL HISTORY: Past Surgical History:  Procedure Laterality Date  . BREAST LUMPECTOMY  2/13   sentinel node  .  endometrial polyp    . HYSTEROSCOPY  12/00   polyps  . MASTECTOMY, PARTIAL  09/14/11    patial right breast  . TONSILLECTOMY     adenoids  . TOTAL KNEE ARTHROPLASTY Right 07/08/2019   Procedure: TOTAL KNEE ARTHROPLASTY;  Surgeon: Jamie Arabian, MD;  Location: [facility];  Service: Orthopedics;  Laterality: Right;  40mn    FAMILY HISTORY Family History  Problem Relation Age of Onset  . Skin cancer Father 722 . Stroke Father   . Stomach cancer Paternal Grandmother 759 . Breast cancer Paternal Aunt        x 2  . Breast cancer Cousin        ages 321-60at  dx x 4 paternal  . Prostate cancer Cousin        ????  . Brain cancer Maternal Uncle        brain  . Cancer Cousin        tonsillar  . Stroke Brother   . 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 Jamie her spare time the patient enjoys traveling, visiting with friends and family and crocheting.    ADVANCED DIRECTIVES: Jamie place   HEALTH MAINTENANCE: Social History   Tobacco Use  . Smoking status: Never Smoker  . Smokeless tobacco: Never Used  Vaping Use  . Vaping Use: Never used  Substance Use Topics  . Alcohol use: No  . Drug use: No     Colonoscopy: 0March 18, 2013 PAP: 11/2011  Bone density: showed osteopenia with T -1.9   Allergies  Allergen Reactions  . Other     PLEASE DO BLOOD PRESSURE Jamie LEFT ARM ONLY    Current Outpatient Medications  Medication Sig Dispense Refill  . ANUCORT-HC 25 MG suppository Place 25 mg rectally 2 (two) times daily as needed for hemorrhoids.    . Ascorbic Acid (VITAMIN C) 1000 MG tablet Take 1,000 mg by mouth daily.    . Calcium Carb-Cholecalciferol (CALCIUM 600+D3 PO) Take 1 tablet by mouth 3 (three) times daily.    .Marland Kitchendenosumab (PROLIA) 60 MG/ML SOLN injection Inject 60 mg into the skin every 6 (six) months. Administer Jamie upper arm, thigh, or abdomen    . levothyroxine (SYNTHROID) 112 MCG tablet Take 112 mcg by mouth See admin instructions. Take 0.5 tablet (56 mcg) by mouth on Sundays & Wednesdays Jamie the morning, take 1 tablet (112 mcg) by mouth on all other days of the week.    . Magnesium Citrate 200 MG TABS Take 200 mg by mouth 2 (two) times daily.    .Marland KitchenRESVERATROL PO Take 500 mg by  mouth daily.    . Vitamin D, Ergocalciferol, (DRISDOL) 1.25 MG (50000 UNIT) CAPS capsule Take 1 capsule (50,000 Units total) by mouth every 7 (seven) days. 12 capsule 3   No current facility-administered medications for this visit.    OBJECTIVE:  white woman Jamie no acute distress Vitals:   10/22/20 1146  BP: (!) 144/68  Pulse: 71  Resp: 18  Temp: (!) 97 F (36.1 C)  SpO2: 98%  Body mass index is 30.88 kg/m.      ECOG FS: 1 - Symptomatic but completely ambulatory  Sclerae unicteric, EOMs intact Wearing a mask No cervical or supraclavicular adenopathy Lungs no rales or rhonchi Heart regular rate and rhythm Abd soft, obese, nontender, positive bowel sounds MSK moderate kyphosis but no focal spinal tenderness, no upper extremity lymphedema Neuro: nonfocal, well oriented, appropriate affect Breasts: The right breast is status post lumpectomy and radiation.  There is no evidence of local recurrence per the left breast and both axillae are benign   LAB RESULTS: Lab Results  Component Value Date   WBC 6.9 10/22/2020   NEUTROABS 4.1 10/22/2020   HGB 13.9 10/22/2020   HCT 42.2 10/22/2020   MCV 93.6 10/22/2020   PLT 259 10/22/2020      Chemistry      Component Value Date/Time   NA 143 10/22/2020 1104   NA 140 03/07/2017 1219   K 4.1 10/22/2020 1104   K 4.0 03/07/2017 1219   CL 106 10/22/2020 1104   CL 106 01/01/2013 0845   CO2 27 10/22/2020 1104   CO2 23 03/07/2017 1219   BUN 15 10/22/2020 1104   BUN 15.8 03/07/2017 1219   CREATININE 0.74 10/22/2020 1104   CREATININE 0.70 03/05/2018 1248   CREATININE 0.6 03/07/2017 1219      Component Value Date/Time   CALCIUM 9.2 10/22/2020 1104   CALCIUM 8.9 03/07/2017 1219   ALKPHOS 65 10/22/2020 1104   ALKPHOS 67 03/07/2017 1219   AST 16 10/22/2020 1104   AST 17 03/05/2018 1248   AST 15 03/07/2017 1219   ALT 9 10/22/2020 1104   ALT 13 03/05/2018 1248   ALT 12 03/07/2017 1219   BILITOT 0.3 10/22/2020 1104   BILITOT 0.4  03/05/2018 1248   BILITOT 0.32 03/07/2017 1219      Lab Results  Component Value Date   LABCA2 18 06/25/2012    Urinalysis    Component Value Date/Time   COLORURINE YELLOW 11/10/2019 2144   APPEARANCEUR CLEAR 11/10/2019 2144   LABSPEC 1.015 11/10/2019 2144   PHURINE 7.5 11/10/2019 2144   [place] 11/10/2019 2144   HGBUR NEGATIVE 11/10/2019 2144   BILIRUBINUR NEGATIVE 11/10/2019 2144   KETONESUR 40 (A) 11/10/2019 2144   PROTEINUR NEGATIVE 11/10/2019 2144   UROBILINOGEN 0.2 09/14/2011 1013   NITRITE NEGATIVE 11/10/2019 2144   LEUKOCYTESUR NEGATIVE 11/10/2019 2144    STUDIES: MM 3D SCREEN BREAST BILATERAL  Result Date: 10/14/2020 CLINICAL DATA:  Screening. EXAM: DIGITAL SCREENING BILATERAL MAMMOGRAM WITH TOMOSYNTHESIS AND CAD TECHNIQUE: Bilateral screening digital craniocaudal and mediolateral oblique mammograms were obtained. Bilateral screening digital breast tomosynthesis was performed. The images were evaluated with computer-aided detection. COMPARISON:  Previous exam(s). ACR Breast Density Category b: There are scattered areas of fibroglandular density. FINDINGS: There are no findings suspicious for malignancy. The images were evaluated with computer-aided detection. IMPRESSION: No mammographic evidence of malignancy. A result letter of this screening mammogram will be mailed directly to the patient. RECOMMENDATION: Screening mammogram Jamie one year. (Code:SM-B-01Y) BI-RADS CATEGORY  1: Negative. Electronically Signed   By: Claudie Revering M.D.   On: 10/14/2020 16:21      ASSESSMENT: 80 y.o. BRCA negative [place], [place] woman:  1.  Status post right breast core biopsy 08/31/2011 showing invasive ductal carcinoma, ER 100%, PR 100%, Ki-67 19%, HER-2/neu by CISH no amplification with a ratio 1.56.  2.  Status post right breast lumpectomy with right axillary sentinel node biopsy 09/14/2011 for a  pT1b, pN0, stage IA invasive ductal carcinoma, grade 1, again HER-2/neu  by CISH no amplification with ratio of 1.32  3. Status post radiation therapy from 10/18/2011 through 11/18/2011.  4. Started antiestrogen therapy with Arimidex Jamie 10/2011, completing 5 years April 2018  5.  Osteopenia:/ osteoporosis  (a) baseline bone scan January 2014 shows a T score of -1.6  (b)  Bone scan January 2016 shows a T score of -1.9  (c) bone density 08/24/2016 shows a T score of -3.1.   (d) denosumab/Prolia started 09/16/2016, to be repeated every 6 months  (e) repeat bone density 09/03/2018 shows a T score of -1.6   PLAN: Vanshika is now 8 years out from definitive surgery for her breast cancer with no evidence of disease recurrence.  This is very favorable.  She has been receiving Prolia every 6 months for the past 3 years.  She has had a very good response and is no longer osteoporotic.  While I wish we had had a bone density prior to today's visit, I am confident that it will give her some good news.  I have gone ahead and scheduled it for a couple of weeks from now.  At this point my vote is to stop the Prolia.  She could recheck a bone density a couple years from now and if necessary her primary care physician Dr. Osborne Casco could resume those treatments.  Accordingly [place] is "graduating" from follow-up here today.  I gave her a copy of her history so that she has it for her records but we will keep her chart open another 10 years so the data will be available  Total encounter time 25 minutes.*   Lavarius Doughten, Jamie Dad, MD  10/22/20 12:23 PM Medical Oncology and Hematology [facility] [facility] [place], [place] [address] Tel. [phone]    Fax. [phone]    I, Wilburn Mylar, am acting as scribe for Dr. Virgie Cole. Karrington Mccravy.  I, Lurline Del MD, have reviewed the above documentation for accuracy and completeness, and I agree with the above.   *Total Encounter Time as defined by the Centers for Medicare and Medicaid Services includes, Jamie  addition to the face-to-face time of a patient visit (documented Jamie the note above) non-face-to-face time: obtaining and reviewing outside history, ordering and reviewing medications, tests or procedures, care coordination (communications with other health care professionals or caregivers) and documentation Jamie the medical record.

## 2020-10-22 ENCOUNTER — Inpatient Hospital Stay (HOSPITAL_BASED_OUTPATIENT_CLINIC_OR_DEPARTMENT_OTHER): Payer: Medicare Other | Admitting: Oncology

## 2020-10-22 ENCOUNTER — Other Ambulatory Visit: Payer: Self-pay

## 2020-10-22 ENCOUNTER — Inpatient Hospital Stay: Payer: Medicare Other | Attending: Oncology

## 2020-10-22 ENCOUNTER — Inpatient Hospital Stay: Payer: Medicare Other

## 2020-10-22 VITALS — BP 144/68 | HR 71 | Temp 97.0°F | Resp 18 | Ht 64.25 in | Wt 181.3 lb

## 2020-10-22 DIAGNOSIS — Z808 Family history of malignant neoplasm of other organs or systems: Secondary | ICD-10-CM | POA: Diagnosis not present

## 2020-10-22 DIAGNOSIS — C50411 Malignant neoplasm of upper-outer quadrant of right female breast: Secondary | ICD-10-CM

## 2020-10-22 DIAGNOSIS — M818 Other osteoporosis without current pathological fracture: Secondary | ICD-10-CM

## 2020-10-22 DIAGNOSIS — Z8042 Family history of malignant neoplasm of prostate: Secondary | ICD-10-CM | POA: Diagnosis not present

## 2020-10-22 DIAGNOSIS — Z923 Personal history of irradiation: Secondary | ICD-10-CM | POA: Insufficient documentation

## 2020-10-22 DIAGNOSIS — Z79899 Other long term (current) drug therapy: Secondary | ICD-10-CM | POA: Insufficient documentation

## 2020-10-22 DIAGNOSIS — T386X5A Adverse effect of antigonadotrophins, antiestrogens, antiandrogens, not elsewhere classified, initial encounter: Secondary | ICD-10-CM

## 2020-10-22 DIAGNOSIS — Z170 Estrogen receptor positive status [ER+]: Secondary | ICD-10-CM

## 2020-10-22 DIAGNOSIS — M810 Age-related osteoporosis without current pathological fracture: Secondary | ICD-10-CM | POA: Insufficient documentation

## 2020-10-22 DIAGNOSIS — E039 Hypothyroidism, unspecified: Secondary | ICD-10-CM | POA: Insufficient documentation

## 2020-10-22 DIAGNOSIS — Z853 Personal history of malignant neoplasm of breast: Secondary | ICD-10-CM | POA: Insufficient documentation

## 2020-10-22 DIAGNOSIS — Z803 Family history of malignant neoplasm of breast: Secondary | ICD-10-CM | POA: Insufficient documentation

## 2020-10-22 LAB — COMPREHENSIVE METABOLIC PANEL
ALT: 9 U/L (ref 0–44)
AST: 16 U/L (ref 15–41)
Albumin: 3.9 g/dL (ref 3.5–5.0)
Alkaline Phosphatase: 65 U/L (ref 38–126)
Anion gap: 10 (ref 5–15)
BUN: 15 mg/dL (ref 8–23)
CO2: 27 mmol/L (ref 22–32)
Calcium: 9.2 mg/dL (ref 8.9–10.3)
Chloride: 106 mmol/L (ref 98–111)
Creatinine, Ser: 0.74 mg/dL (ref 0.44–1.00)
GFR, Estimated: >60 mL/min (ref >60)
Glucose, Bld: 108 mg/dL — ABNORMAL HIGH (ref 70–99)
Potassium: 4.1 mmol/L (ref 3.5–5.1)
Sodium: 143 mmol/L (ref 135–145)
Total Bilirubin: 0.3 mg/dL (ref 0.3–1.2)
Total Protein: 6.5 g/dL (ref 6.5–8.1)

## 2020-10-22 LAB — CBC WITH DIFFERENTIAL/PLATELET
Abs Immature Granulocytes: 0.01 10*3/uL (ref 0.00–0.07)
Basophils Absolute: 0.1 10*3/uL (ref 0.0–0.1)
Basophils Relative: 1 %
Eosinophils Absolute: 0.4 10*3/uL (ref 0.0–0.5)
Eosinophils Relative: 6 %
HCT: 42.2 % (ref 36.0–46.0)
Hemoglobin: 13.9 g/dL (ref 12.0–15.0)
Immature Granulocytes: 0 %
Lymphocytes Relative: 25 %
Lymphs Abs: 1.7 10*3/uL (ref 0.7–4.0)
MCH: 30.8 pg (ref 26.0–34.0)
MCHC: 32.9 g/dL (ref 30.0–36.0)
MCV: 93.6 fL (ref 80.0–100.0)
Monocytes Absolute: 0.6 10*3/uL (ref 0.1–1.0)
Monocytes Relative: 8 %
Neutro Abs: 4.1 10*3/uL (ref 1.7–7.7)
Neutrophils Relative %: 60 %
Platelets: 259 10*3/uL (ref 150–400)
RBC: 4.51 MIL/uL (ref 3.87–5.11)
RDW: 14 % (ref 11.5–15.5)
WBC: 6.9 10*3/uL (ref 4.0–10.5)
nRBC: 0 % (ref 0.0–0.2)

## 2020-10-22 MED ORDER — DENOSUMAB 60 MG/ML ~~LOC~~ SOSY
PREFILLED_SYRINGE | SUBCUTANEOUS | Status: AC
  Filled 2020-10-22: qty 1

## 2020-10-22 MED ORDER — DENOSUMAB 60 MG/ML ~~LOC~~ SOSY
60.0000 mg | PREFILLED_SYRINGE | Freq: Once | SUBCUTANEOUS | Status: AC
  Administered 2020-10-22: 60 mg via SUBCUTANEOUS

## 2020-10-23 ENCOUNTER — Telehealth: Payer: Self-pay

## 2020-10-23 MED ORDER — PROCHLORPERAZINE MALEATE 10 MG PO TABS
ORAL_TABLET | ORAL | Status: AC
  Filled 2020-10-23: qty 1

## 2020-10-23 NOTE — Telephone Encounter (Signed)
Pt called stating she is unable to have bone density at [facility] until 04/02/21 but Dr Jannifer Rodney told her he would like her to have it in 2 weeks. This information was given to Dr Jannifer Rodney to approve waiting until September, or OK to go somewhere else. Pt expecting call back to let her know,

## 2020-10-26 ENCOUNTER — Telehealth: Payer: Self-pay

## 2020-10-26 NOTE — Telephone Encounter (Signed)
Per MD OK that patient's bone density is not available until September.  RN encouraged patient to contact clinic every few weeks for possible cancellations.

## 2020-11-19 DIAGNOSIS — L309 Dermatitis, unspecified: Secondary | ICD-10-CM | POA: Diagnosis not present

## 2020-11-19 DIAGNOSIS — L814 Other melanin hyperpigmentation: Secondary | ICD-10-CM | POA: Diagnosis not present

## 2020-11-19 DIAGNOSIS — L578 Other skin changes due to chronic exposure to nonionizing radiation: Secondary | ICD-10-CM | POA: Diagnosis not present

## 2020-11-19 DIAGNOSIS — L570 Actinic keratosis: Secondary | ICD-10-CM | POA: Diagnosis not present

## 2020-11-19 DIAGNOSIS — Z86018 Personal history of other benign neoplasm: Secondary | ICD-10-CM | POA: Diagnosis not present

## 2020-11-19 DIAGNOSIS — D225 Melanocytic nevi of trunk: Secondary | ICD-10-CM | POA: Diagnosis not present

## 2020-11-19 DIAGNOSIS — L03019 Cellulitis of unspecified finger: Secondary | ICD-10-CM | POA: Diagnosis not present

## 2020-11-19 DIAGNOSIS — L821 Other seborrheic keratosis: Secondary | ICD-10-CM | POA: Diagnosis not present

## 2020-11-19 DIAGNOSIS — Z85828 Personal history of other malignant neoplasm of skin: Secondary | ICD-10-CM | POA: Diagnosis not present

## 2020-11-19 DIAGNOSIS — J309 Allergic rhinitis, unspecified: Secondary | ICD-10-CM | POA: Diagnosis not present

## 2020-11-19 DIAGNOSIS — L820 Inflamed seborrheic keratosis: Secondary | ICD-10-CM | POA: Diagnosis not present

## 2020-12-14 ENCOUNTER — Other Ambulatory Visit: Payer: Self-pay

## 2020-12-14 ENCOUNTER — Ambulatory Visit
Admission: RE | Admit: 2020-12-14 | Discharge: 2020-12-14 | Disposition: A | Discharge status: Home / Self Care | Payer: Medicare Other | Source: Ambulatory Visit | Attending: Oncology | Admitting: Oncology

## 2020-12-14 DIAGNOSIS — Z780 Asymptomatic menopausal state: Secondary | ICD-10-CM | POA: Diagnosis not present

## 2020-12-14 DIAGNOSIS — M8589 Other specified disorders of bone density and structure, multiple sites: Secondary | ICD-10-CM | POA: Diagnosis not present

## 2020-12-14 DIAGNOSIS — C50411 Malignant neoplasm of upper-outer quadrant of right female breast: Secondary | ICD-10-CM

## 2020-12-31 DIAGNOSIS — E7800 Pure hypercholesterolemia, unspecified: Secondary | ICD-10-CM | POA: Diagnosis not present

## 2020-12-31 DIAGNOSIS — E559 Vitamin D deficiency, unspecified: Secondary | ICD-10-CM | POA: Diagnosis not present

## 2020-12-31 DIAGNOSIS — E039 Hypothyroidism, unspecified: Secondary | ICD-10-CM | POA: Diagnosis not present

## 2020-12-31 DIAGNOSIS — R7302 Impaired glucose tolerance (oral): Secondary | ICD-10-CM | POA: Diagnosis not present

## 2020-12-31 DIAGNOSIS — Z0000 Encounter for general adult medical examination without abnormal findings: Secondary | ICD-10-CM | POA: Diagnosis not present

## 2021-01-07 DIAGNOSIS — Z0000 Encounter for general adult medical examination without abnormal findings: Secondary | ICD-10-CM | POA: Diagnosis not present

## 2021-01-07 DIAGNOSIS — Z170 Estrogen receptor positive status [ER+]: Secondary | ICD-10-CM | POA: Diagnosis not present

## 2021-01-07 DIAGNOSIS — M818 Other osteoporosis without current pathological fracture: Secondary | ICD-10-CM | POA: Diagnosis not present

## 2021-01-07 DIAGNOSIS — E669 Obesity, unspecified: Secondary | ICD-10-CM | POA: Diagnosis not present

## 2021-01-07 DIAGNOSIS — R7302 Impaired glucose tolerance (oral): Secondary | ICD-10-CM | POA: Diagnosis not present

## 2021-01-07 DIAGNOSIS — C50411 Malignant neoplasm of upper-outer quadrant of right female breast: Secondary | ICD-10-CM | POA: Diagnosis not present

## 2021-01-07 DIAGNOSIS — E039 Hypothyroidism, unspecified: Secondary | ICD-10-CM | POA: Diagnosis not present

## 2021-01-07 DIAGNOSIS — M160 Bilateral primary osteoarthritis of hip: Secondary | ICD-10-CM | POA: Diagnosis not present

## 2021-01-07 DIAGNOSIS — R82998 Other abnormal findings in urine: Secondary | ICD-10-CM | POA: Diagnosis not present

## 2021-01-07 DIAGNOSIS — Z1331 Encounter for screening for depression: Secondary | ICD-10-CM | POA: Diagnosis not present

## 2021-01-07 DIAGNOSIS — E7800 Pure hypercholesterolemia, unspecified: Secondary | ICD-10-CM | POA: Diagnosis not present

## 2021-01-07 DIAGNOSIS — Z1339 Encounter for screening examination for other mental health and behavioral disorders: Secondary | ICD-10-CM | POA: Diagnosis not present

## 2021-01-07 DIAGNOSIS — Z96651 Presence of right artificial knee joint: Secondary | ICD-10-CM | POA: Diagnosis not present

## 2021-01-19 DIAGNOSIS — H4323 Crystalline deposits in vitreous body, bilateral: Secondary | ICD-10-CM | POA: Diagnosis not present

## 2021-01-19 DIAGNOSIS — H26491 Other secondary cataract, right eye: Secondary | ICD-10-CM | POA: Diagnosis not present

## 2021-01-19 DIAGNOSIS — H52203 Unspecified astigmatism, bilateral: Secondary | ICD-10-CM | POA: Diagnosis not present

## 2021-01-30 DIAGNOSIS — S50362A Insect bite (nonvenomous) of left elbow, initial encounter: Secondary | ICD-10-CM | POA: Diagnosis not present

## 2021-04-02 ENCOUNTER — Other Ambulatory Visit: Payer: Medicare Other

## 2021-04-05 DIAGNOSIS — L255 Unspecified contact dermatitis due to plants, except food: Secondary | ICD-10-CM | POA: Diagnosis not present

## 2021-04-26 ENCOUNTER — Other Ambulatory Visit: Payer: Self-pay | Admitting: Obstetrics & Gynecology

## 2021-05-03 ENCOUNTER — Other Ambulatory Visit (HOSPITAL_BASED_OUTPATIENT_CLINIC_OR_DEPARTMENT_OTHER): Payer: Self-pay

## 2021-05-03 ENCOUNTER — Other Ambulatory Visit: Payer: Self-pay

## 2021-05-03 ENCOUNTER — Other Ambulatory Visit (INDEPENDENT_AMBULATORY_CARE_PROVIDER_SITE_OTHER): Payer: Medicare Other

## 2021-05-03 ENCOUNTER — Ambulatory Visit: Payer: Medicare Other | Attending: Internal Medicine

## 2021-05-03 ENCOUNTER — Encounter: Payer: Self-pay | Admitting: Oncology

## 2021-05-03 DIAGNOSIS — T386X5A Adverse effect of antigonadotrophins, antiestrogens, antiandrogens, not elsewhere classified, initial encounter: Secondary | ICD-10-CM

## 2021-05-03 DIAGNOSIS — E559 Vitamin D deficiency, unspecified: Secondary | ICD-10-CM

## 2021-05-03 DIAGNOSIS — Z23 Encounter for immunization: Secondary | ICD-10-CM

## 2021-05-03 DIAGNOSIS — M818 Other osteoporosis without current pathological fracture: Secondary | ICD-10-CM

## 2021-05-03 MED ORDER — PFIZER COVID-19 VAC BIVALENT 30 MCG/0.3ML IM SUSP
INTRAMUSCULAR | 0 refills | Status: DC
  Filled 2021-05-03: qty 0.3, 1d supply, fill #0

## 2021-05-03 NOTE — Progress Notes (Signed)
   Covid-19 Vaccination Clinic  Name:  Jamie Cole    MRN: [id number] DOB: 1940-08-14  05/03/2021  Jamie Cole was observed post Covid-19 immunization for 15 minutes without incident. She was provided with Vaccine Information Sheet and instruction to access the V-Safe system.   Jamie Cole was instructed to call 911 with any severe reactions post vaccine: Difficulty breathing  Swelling of face and throat  A fast heartbeat  A bad rash all over body  Dizziness and weakness

## 2021-05-03 NOTE — Progress Notes (Signed)
Pt here for Vit D level.

## 2021-05-04 ENCOUNTER — Other Ambulatory Visit (HOSPITAL_BASED_OUTPATIENT_CLINIC_OR_DEPARTMENT_OTHER): Payer: Self-pay | Admitting: Obstetrics & Gynecology

## 2021-05-04 LAB — VITAMIN D 25 HYDROXY (VIT D DEFICIENCY, FRACTURES): Vit D, 25-Hydroxy: 63.6 ng/mL (ref 30.0–100.0)

## 2021-07-09 DIAGNOSIS — E559 Vitamin D deficiency, unspecified: Secondary | ICD-10-CM | POA: Diagnosis not present

## 2021-07-09 DIAGNOSIS — K635 Polyp of colon: Secondary | ICD-10-CM | POA: Diagnosis not present

## 2021-07-09 DIAGNOSIS — M160 Bilateral primary osteoarthritis of hip: Secondary | ICD-10-CM | POA: Diagnosis not present

## 2021-07-09 DIAGNOSIS — R739 Hyperglycemia, unspecified: Secondary | ICD-10-CM | POA: Diagnosis not present

## 2021-07-09 DIAGNOSIS — Z96651 Presence of right artificial knee joint: Secondary | ICD-10-CM | POA: Diagnosis not present

## 2021-07-09 DIAGNOSIS — E669 Obesity, unspecified: Secondary | ICD-10-CM | POA: Diagnosis not present

## 2021-07-09 DIAGNOSIS — R7302 Impaired glucose tolerance (oral): Secondary | ICD-10-CM | POA: Diagnosis not present

## 2021-07-09 DIAGNOSIS — C50411 Malignant neoplasm of upper-outer quadrant of right female breast: Secondary | ICD-10-CM | POA: Diagnosis not present

## 2021-07-09 DIAGNOSIS — M818 Other osteoporosis without current pathological fracture: Secondary | ICD-10-CM | POA: Diagnosis not present

## 2021-07-09 DIAGNOSIS — Z170 Estrogen receptor positive status [ER+]: Secondary | ICD-10-CM | POA: Diagnosis not present

## 2021-07-09 DIAGNOSIS — E039 Hypothyroidism, unspecified: Secondary | ICD-10-CM | POA: Diagnosis not present

## 2021-07-09 DIAGNOSIS — E7800 Pure hypercholesterolemia, unspecified: Secondary | ICD-10-CM | POA: Diagnosis not present

## 2021-08-02 DIAGNOSIS — J029 Acute pharyngitis, unspecified: Secondary | ICD-10-CM | POA: Diagnosis not present

## 2021-08-04 ENCOUNTER — Encounter: Payer: Self-pay | Admitting: Oncology

## 2021-08-05 ENCOUNTER — Encounter (HOSPITAL_BASED_OUTPATIENT_CLINIC_OR_DEPARTMENT_OTHER): Payer: Self-pay | Admitting: Obstetrics & Gynecology

## 2021-08-05 ENCOUNTER — Ambulatory Visit (INDEPENDENT_AMBULATORY_CARE_PROVIDER_SITE_OTHER): Payer: Medicare Other | Admitting: Obstetrics & Gynecology

## 2021-08-05 ENCOUNTER — Other Ambulatory Visit: Payer: Self-pay

## 2021-08-05 VITALS — BP 141/73 | HR 79 | Ht 63.25 in | Wt 186.0 lb

## 2021-08-05 DIAGNOSIS — E039 Hypothyroidism, unspecified: Secondary | ICD-10-CM

## 2021-08-05 DIAGNOSIS — E559 Vitamin D deficiency, unspecified: Secondary | ICD-10-CM

## 2021-08-05 DIAGNOSIS — Z1231 Encounter for screening mammogram for malignant neoplasm of breast: Secondary | ICD-10-CM

## 2021-08-05 DIAGNOSIS — Z170 Estrogen receptor positive status [ER+]: Secondary | ICD-10-CM

## 2021-08-05 DIAGNOSIS — Z853 Personal history of malignant neoplasm of breast: Secondary | ICD-10-CM | POA: Diagnosis not present

## 2021-08-05 DIAGNOSIS — Z9189 Other specified personal risk factors, not elsewhere classified: Secondary | ICD-10-CM | POA: Diagnosis not present

## 2021-08-05 DIAGNOSIS — C50411 Malignant neoplasm of upper-outer quadrant of right female breast: Secondary | ICD-10-CM

## 2021-08-05 NOTE — Progress Notes (Signed)
81 y.o. G38P1001 Widowed White or Caucasian female here for breast and pelvic exam.  I am also following her for ER positive early breast cancer.  Will be 10 years since diagnosis later this year.  Release from oncology when saw Dr. Jana Hakim last.  Had [facility] Nov 11, 2020.  Marland Kitchen  Denies vaginal bleeding.  On Vit D for low Vit D level.  RF done 05/2021.  Does not need RF today but desires to continue.  Patient's last menstrual period was 08/02/1995.          Sexually active: No.  H/O STD:  no  Health Maintenance: PCP:  [place].  Last wellness appt was 07/2021.  Did blood work at that appt:  yes.  Is seen every 6 months.6/15 Vaccines are up to date:  yes Colonoscopy:  10/11/11, no follow up recommended MMG:  2020/11/11 neg BMD:  12/14/20 osteopenia Last pap smear:  08/16/16 neg.   H/o abnormal pap smear:  no   reports that she has never smoked. She has never used smokeless tobacco. She reports that she does not drink alcohol and does not use drugs.  Past Medical History:  Diagnosis Date   Basal cell carcinoma of neck        Breast cancer ([place]) 08/31/2011   DCIS; right breast, ER/PR +, Her 2 neg   Complication of anesthesia    slow to wake up   Compressed cervical disc    compression deformity  at T12   Endometrial hyperplasia, simple    Endometrial polyp    Family history of adverse reaction to anesthesia 12-Nov-1991   Mother died during hip surgery   Fracture    right arm   H/O TB skin testing 1970's   + INH rx   Hx of radiation therapy 10/18/11 to 11/18/11   right breast   Hx of radiation therapy 1957   4 tx to face for acne   Hypothyroidism    Metacarpal bone fracture 08/30/2012   Right 5th finger fracture   Osteoporosis    Personal history of radiation therapy     Past Surgical History:  Procedure Laterality Date   BREAST LUMPECTOMY  2/13   sentinel node   endometrial polyp     HYSTEROSCOPY  12/00   polyps   MASTECTOMY, PARTIAL  09/14/11    patial right breast    TONSILLECTOMY     adenoids   TOTAL KNEE ARTHROPLASTY Right 07/08/2019   Procedure: TOTAL KNEE ARTHROPLASTY;  Surgeon: Gaynelle Arabian, MD;  Location: [facility];  Service: Orthopedics;  Laterality: Right;  65min    Current Outpatient Medications  Medication Sig Dispense Refill   ANUCORT-HC 25 MG suppository Place 25 mg rectally 2 (two) times daily as needed for hemorrhoids.     Ascorbic Acid (VITAMIN C) 1000 MG tablet Take 1,000 mg by mouth daily.     Calcium Carb-Cholecalciferol (CALCIUM 600+D3 PO) Take 1 tablet by mouth 3 (three) times daily.     levothyroxine (SYNTHROID) 112 MCG tablet Take 112 mcg by mouth See admin instructions. Take 0.5 tablet (56 mcg) by mouth on Sundays & Wednesdays in the morning, take 1 tablet (112 mcg) by mouth on all other days of the week.     Magnesium Citrate 200 MG TABS Take 200 mg by mouth 2 (two) times daily.     RESVERATROL PO Take 500 mg by mouth daily.     Vitamin D, Ergocalciferol, (DRISDOL) 1.25 MG (50000 UNIT) CAPS capsule TAKE 1 CAPSULE BY MOUTH EVERY  7 DAYS 12 capsule 3   denosumab (PROLIA) 60 MG/ML SOLN injection Inject 60 mg into the skin every 6 (six) months. Administer in upper arm, thigh, or abdomen (Patient not taking: Reported on 08/05/2021)     No current facility-administered medications for this visit.    Family History  Problem Relation Age of Onset   Skin cancer Father 49   Stroke Father    Stomach cancer Paternal Grandmother 98   Breast cancer Paternal Aunt        x 2   Breast cancer Cousin        ages 4-60 at dx x 4 paternal   Prostate cancer Cousin        ????   Brain cancer Maternal Uncle        brain   Cancer Cousin        tonsillar   Stroke Brother    Colon cancer Neg Hx     Review of Systems  All other systems reviewed and are negative.  Exam:   BP (!) 141/73    Pulse 79    Ht 5' 3.25" (1.607 m)    Wt 186 lb (84.4 kg)    LMP 08/02/1995    BMI 32.69 kg/m   Height: 5' 3.25" (160.7 cm)  General  appearance: alert, cooperative and appears stated age Breasts: normal appearance, no masses or tenderness, well healed incisions on right, radiation changes, stable. Abdomen: soft, non-tender; bowel sounds normal; no masses,  no organomegaly Lymph nodes: Cervical, supraclavicular, and axillary nodes normal.  No abnormal inguinal nodes palpated Neurologic: Grossly normal  Pelvic: External genitalia:  no lesions              Urethra:  normal appearing urethra with no masses, tenderness or lesions              Bartholins and Skenes: normal                 Vagina: normal appearing vagina with atrophic changes and no discharge, no lesions              Cervix: no lesions              Pap taken: No. Bimanual Exam:  Uterus:  normal size, contour, position, consistency, mobility, non-tender              Adnexa: normal adnexa and no mass, fullness, tenderness               Rectovaginal: Confirms               Anus:  normal sphincter tone, no lesions  Chaperone, Octaviano Batty, CMA, was present for exam.  Assessment/Plan: 1. GYN exam for high-risk Medicare patient - pap smear not indicated - doing yearly MMG - colonoscopy not indicated unless has new issues - BMD 11/2020 with osteopenia - lab work done with Dr. Osborne Casco - vaccines reviewed/updated  2. Encounter for screening mammogram for malignant neoplasm of breast - MM 3D SCREEN BREAST BILATERAL; Future  3. Vitamin D deficiency - does not need RF at this time as was done 05/2021  4. Malignant neoplasm of upper-outer quadrant of right breast in female, estrogen receptor positive ([place]) - release from oncology  5. Acquired hypothyroidism - on levothyroxine

## 2021-10-08 ENCOUNTER — Other Ambulatory Visit: Payer: Self-pay

## 2021-10-08 ENCOUNTER — Encounter (HOSPITAL_COMMUNITY): Payer: Self-pay | Admitting: Emergency Medicine

## 2021-10-08 ENCOUNTER — Ambulatory Visit (HOSPITAL_COMMUNITY)
Admission: EM | Admit: 2021-10-08 | Discharge: 2021-10-08 | Disposition: A | Discharge status: Home / Self Care | Payer: Medicare Other

## 2021-10-08 DIAGNOSIS — R220 Localized swelling, mass and lump, head: Secondary | ICD-10-CM | POA: Diagnosis not present

## 2021-10-08 NOTE — ED Provider Notes (Signed)
?[place] ? ? ? ?CSN: [id number] ?Arrival date & time: 10/08/21  1211 ? ? ?  ? ?History   ?Chief Complaint ?Chief Complaint  ?Patient presents with  ? Hair/Scalp Problem  ?  Knot on head, does not remember striking it  ? ? ?HPI ?Jamie Cole is a 81 y.o. female.  ? ?Patient reports yesterday she was moving boxes around in her garage and noticed a bump on the back of her head this morning.  She does not recall bumping her head on anything recently.  She does report a distant fall to the back of her head which required stitches, however this was years ago and she has fully healed since.  She denies changes in vision, appetite, mood.  She is not having difficulty with her words, fevers, or nausea/vomiting.  The bump is not painful or tender. ? ? ?Past Medical History:  ?Diagnosis Date  ? Basal cell carcinoma of neck   ?    ? Breast cancer ([place]) 08/31/2011  ? DCIS; right breast, ER/PR +, Her 2 neg  ? Complication of anesthesia   ? slow to wake up  ? Compressed cervical disc   ? compression deformity  at T12  ? Endometrial hyperplasia, simple   ? Endometrial polyp   ? Family history of adverse reaction to anesthesia 1993  ? Mother died during hip surgery  ? Fracture   ? right arm  ? H/O TB skin testing 1970's  ? + INH rx  ? Hx of radiation therapy 10/18/11 to 11/18/11  ? right breast  ? Hx of radiation therapy 1957  ? 4 tx to face for acne  ? Hypothyroidism   ? Metacarpal bone fracture 08/30/2012  ? Right 5th finger fracture  ? Osteoporosis   ? Personal history of radiation therapy   ? ? ?Patient Active Problem List  ? Diagnosis Date Noted  ? History of total knee replacement, right 07/23/2019  ? OA (osteoarthritis) of knee 07/08/2019  ? Osteoporosis due to aromatase inhibitor 08/29/2016  ? Shingles 04/17/2015  ? Malignant neoplasm of upper-outer quadrant of right breast in female, estrogen receptor positive ([place]) 08/22/2013  ? Hx of radiation therapy   ? Acquired hypothyroidism 09/07/2011  ? ? ?Past Surgical  History:  ?Procedure Laterality Date  ? BREAST LUMPECTOMY  2/13  ? sentinel node  ? endometrial polyp    ? HYSTEROSCOPY  12/00  ? polyps  ? MASTECTOMY, PARTIAL  09/14/11  ?  patial right breast  ? TONSILLECTOMY    ? adenoids  ? TOTAL KNEE ARTHROPLASTY Right 07/08/2019  ? Procedure: TOTAL KNEE ARTHROPLASTY;  Surgeon: Gaynelle Arabian, MD;  Location: [facility];  Service: Orthopedics;  Laterality: Right;  67mn  ? ? ?OB History   ? ? Gravida  ?1  ? Para  ?1  ? Term  ?1  ? Preterm  ?   ? AB  ?   ? Living  ?1  ?  ? ? SAB  ?   ? IAB  ?   ? Ectopic  ?   ? Multiple  ?   ? Live Births  ?1  ?   ?  ? Obstetric Comments  ?Menarche age 81 menopause 554 HRT x 2-3 yrs  ?  ? ?  ? ? ? ?Home Medications   ? ?Prior to Admission medications   ?Medication Sig Start Date End Date Taking? Authorizing Provider  ?ANUCORT-HC 25 MG suppository Place 25 mg rectally 2 (two) times  daily as needed for hemorrhoids. 12/31/18   [provider]  ?Ascorbic Acid (VITAMIN C) 1000 MG tablet Take 1,000 mg by mouth daily.    [provider]  ?Calcium Carb-Cholecalciferol (CALCIUM 600+D3 PO) Take 1 tablet by mouth 3 (three) times daily.    [provider]  ?levothyroxine (SYNTHROID) 112 MCG tablet Take 112 mcg by mouth See admin instructions. Take 0.5 tablet (56 mcg) by mouth on Sundays & Wednesdays in the morning, take 1 tablet (112 mcg) by mouth on all other days of the week.    [provider]  ?Magnesium Citrate 200 MG TABS Take 200 mg by mouth 2 (two) times daily.    [provider]  ?RESVERATROL PO Take 500 mg by mouth daily.    [provider]  ?Vitamin D, Ergocalciferol, (DRISDOL) 1.25 MG (50000 UNIT) CAPS capsule TAKE 1 CAPSULE BY MOUTH EVERY 7 DAYS 05/04/21   Megan Salon, MD  ? ? ?Family History ?Family History  ?Problem Relation Age of Onset  ? Skin cancer Father 30  ? Stroke Father   ? Stomach cancer Paternal Grandmother 24  ? Breast cancer Paternal Aunt   ?     x 2  ? Breast cancer Cousin   ?      ages 48-60 at dx x 4 paternal  ? Prostate cancer Cousin   ?     ????  ? Brain cancer Maternal Uncle   ?     brain  ? Cancer Cousin   ?     tonsillar  ? Stroke Brother   ? Colon cancer Neg Hx   ? ? ?Social History ?Social History  ? ?Tobacco Use  ? Smoking status: Never  ? Smokeless tobacco: Never  ?Vaping Use  ? Vaping Use: Never used  ?Substance Use Topics  ? Alcohol use: No  ? Drug use: No  ? ? ? ?Allergies   ?Other ? ? ?Review of Systems ?Review of Systems ?Per HPI ? ?Physical Exam ?Triage Vital Signs ?ED Triage Vitals  ?Enc Vitals Group  ?   BP 10/08/21 1310 (!) 168/84  ?   Pulse Rate 10/08/21 1310 80  ?   Resp 10/08/21 1310 16  ?   Temp 10/08/21 1310 98.4 ?F (36.9 ?C)  ?   Temp Source 10/08/21 1310 Oral  ?   SpO2 10/08/21 1310 97 %  ?   Weight --   ?   Height --   ?   Head Circumference --   ?   Peak Flow --   ?   Pain Score 10/08/21 1309 2  ?   Pain Loc --   ?   Pain Edu? --   ?   Excl. in [place]? --   ? ?No data found. ? ?Updated Vital Signs ?BP (!) 168/84   Pulse 80   Temp 98.4 ?F (36.9 ?C) (Oral)   Resp 16   LMP 08/02/1995   SpO2 97%  ? ?Visual Acuity ?Right Eye Distance:   ?Left Eye Distance:   ?Bilateral Distance:   ? ?Right Eye Near:   ?Left Eye Near:    ?Bilateral Near:    ? ?Physical Exam ?Vitals and nursing note reviewed.  ?Constitutional:   ?   General: She is not in acute distress. ?   Appearance: Normal appearance. She is not toxic-appearing.  ?HENT:  ?   Head:  ? ?   Comments: Approximately 1 cm x 2 cm solid, hard, mass to the  posterior scalp immediately superior to healed scar.  There is no redness, fluctuance, warmth, draining, or tenderness to palpation.   ?Skin: ?   General: Skin is warm.  ?   Coloration: Skin is not jaundiced or pale.  ?   Findings: No erythema.  ?Neurological:  ?   Mental Status: She is alert and oriented to person, place, and time.  ?Psychiatric:     ?   Mood and Affect: Mood normal.     ?   Behavior: Behavior normal.     ?   Thought Content: Thought content normal.     ?    Judgment: Judgment normal.  ? ? ? ?UC Treatments / Results  ?Labs ?(all labs ordered are listed, but only abnormal results are displayed) ?Labs Reviewed - No data to display ? ?EKG ? ? ?Radiology ?No results found. ? ?Procedures ?Procedures (including critical care time) ? ?Medications Ordered in UC ?Medications - No data to display ? ?Initial Impression / Assessment and Plan / UC Course  ?I have reviewed the triage vital signs and the nursing notes. ? ?Pertinent labs & imaging results that were available during my care of the patient were reviewed by me and considered in my medical decision making (see chart for details). ? ?  ?Suspect the mass she is feeling is scar tissue.  There are no signs of abscess or acute infection today.  Encouraged leaving the area alone.  If signs or symptoms of infection develop, return here or go to the Emergency Room. ?Final Clinical Impressions(s) / UC Diagnoses  ? ?Final diagnoses:  ?Localized swelling, mass, and lump of head  ? ? ? ?Discharge Instructions   ? ?  ?I believe the bump you are feeling is scar tissue.  If the bump gets worse, please seek care or go to the Emergency Room.   ? ? ? ? ?ED Prescriptions   ?None ?  ? ?PDMP not reviewed this encounter. ?  ?Eulogio Bear, NP ?10/08/21 1330 ? ?

## 2021-10-08 NOTE — ED Triage Notes (Signed)
PT was moving things in her garage yesterday, but does not remember bumping her head on any surface.  ? ?Palpable swollen area on back of head.  ?

## 2021-10-08 NOTE — Discharge Instructions (Addendum)
I believe the bump you are feeling is scar tissue.  If the bump gets worse, please seek care or go to the Emergency Room.   ?

## 2021-10-15 ENCOUNTER — Ambulatory Visit
Admission: RE | Admit: 2021-10-15 | Discharge: 2021-10-15 | Disposition: A | Discharge status: Home / Self Care | Payer: Medicare Other | Source: Ambulatory Visit | Attending: Obstetrics & Gynecology | Admitting: Obstetrics & Gynecology

## 2021-10-15 DIAGNOSIS — Z1231 Encounter for screening mammogram for malignant neoplasm of breast: Secondary | ICD-10-CM

## 2021-11-22 DIAGNOSIS — L578 Other skin changes due to chronic exposure to nonionizing radiation: Secondary | ICD-10-CM | POA: Diagnosis not present

## 2021-11-22 DIAGNOSIS — L821 Other seborrheic keratosis: Secondary | ICD-10-CM | POA: Diagnosis not present

## 2021-11-22 DIAGNOSIS — L814 Other melanin hyperpigmentation: Secondary | ICD-10-CM | POA: Diagnosis not present

## 2021-11-22 DIAGNOSIS — Z85828 Personal history of other malignant neoplasm of skin: Secondary | ICD-10-CM | POA: Diagnosis not present

## 2021-11-22 DIAGNOSIS — Z86018 Personal history of other benign neoplasm: Secondary | ICD-10-CM | POA: Diagnosis not present

## 2021-11-22 DIAGNOSIS — D225 Melanocytic nevi of trunk: Secondary | ICD-10-CM | POA: Diagnosis not present

## 2021-11-27 ENCOUNTER — Ambulatory Visit (HOSPITAL_COMMUNITY)
Admission: EM | Admit: 2021-11-27 | Discharge: 2021-11-27 | Disposition: A | Discharge status: Home / Self Care | Payer: Medicare Other | Attending: Internal Medicine | Admitting: Internal Medicine

## 2021-11-27 ENCOUNTER — Encounter (HOSPITAL_COMMUNITY): Payer: Self-pay

## 2021-11-27 DIAGNOSIS — J3089 Other allergic rhinitis: Secondary | ICD-10-CM

## 2021-11-27 MED ORDER — LORATADINE 10 MG PO TABS: 10.0000 mg | ORAL_TABLET | Freq: Every day | ORAL | 0 refills | Status: DC

## 2021-11-27 NOTE — Discharge Instructions (Addendum)
Your symptoms are likely caused by the exposure to the cleaning chemical at the beach.  You have a lot of irritation in your throat and your nose.  I would like for you to take loratadine (Claritin) for the next 14 days to calm down the drainage and inflammation in your throat.  You may also do salt water and baking soda gargles.  Please put 1 teaspoon of salt and 1/2 teaspoon of baking soda in 8 ounces of warm water and gargle this then spit it out.  This will help with your throat pain as well. ? ?If you develop any new or worsening symptoms, please return to urgent care.  If your symptoms are severe please go to the emergency room.  Otherwise, you may follow-up with your primary care provider as needed. ? ?I hope you feel better! ? ?

## 2021-11-27 NOTE — ED Triage Notes (Signed)
Pt presents with non productive cough and sore throat for past few days. ?

## 2021-11-27 NOTE — ED Provider Notes (Signed)
?[place] ? ? ? ?CSN: [id number] ?Arrival date & time: 11/27/21  1727 ? ? ?  ? ?History   ?Chief Complaint ?Chief Complaint  ?Patient presents with  ? Cough  ? Sore Throat  ? ? ?HPI ?Jamie Cole is a 81 y.o. female.  ? ?Presents to urgent care for a cough and sore throat for the last 3 days. Denies fever/chills at home. Denies nausea, vomiting, diarrhea, constipation, dizziness, headache, shortness of breath, and chest pain. Patient has taken honey and vinegar to ease her sore throat and states that it has helped a little bit. States the coughing was from a "tickle" in her throat. Denies nasal congestion and nasal drainage. Denies sick contacts and recent antibiotic use. Denies increased intake of acidic foods that could cause increased throat clearing due to acid reflux. She states "I wanted to make sure it wasn't strep throat." She was just at the beach and she came back home 3 days ago. The cough started when she was on the way home from the beach. Denies any other aggravating or relieving factors.  ? ? ?Cough ?Sore Throat ? ? ?Past Medical History:  ?Diagnosis Date  ? Basal cell carcinoma of neck   ?    ? Breast cancer ([place]) 08/31/2011  ? DCIS; right breast, ER/PR +, Her 2 neg  ? Complication of anesthesia   ? slow to wake up  ? Compressed cervical disc   ? compression deformity  at T12  ? Endometrial hyperplasia, simple   ? Endometrial polyp   ? Family history of adverse reaction to anesthesia 1993  ? Mother died during hip surgery  ? Fracture   ? right arm  ? H/O TB skin testing 1970's  ? + INH rx  ? Hx of radiation therapy 10/18/11 to 11/18/11  ? right breast  ? Hx of radiation therapy 1957  ? 4 tx to face for acne  ? Hypothyroidism   ? Metacarpal bone fracture 08/30/2012  ? Right 5th finger fracture  ? Osteoporosis   ? Personal history of radiation therapy   ? ? ?Patient Active Problem List  ? Diagnosis Date Noted  ? History of total knee replacement, right 07/23/2019  ? OA (osteoarthritis) of  knee 07/08/2019  ? Osteoporosis due to aromatase inhibitor 08/29/2016  ? Shingles 04/17/2015  ? Malignant neoplasm of upper-outer quadrant of right breast in female, estrogen receptor positive ([place]) 08/22/2013  ? Hx of radiation therapy   ? Acquired hypothyroidism 09/07/2011  ? ? ?Past Surgical History:  ?Procedure Laterality Date  ? BREAST LUMPECTOMY  2/13  ? sentinel node  ? endometrial polyp    ? HYSTEROSCOPY  12/00  ? polyps  ? MASTECTOMY, PARTIAL  09/14/11  ?  patial right breast  ? TONSILLECTOMY    ? adenoids  ? TOTAL KNEE ARTHROPLASTY Right 07/08/2019  ? Procedure: TOTAL KNEE ARTHROPLASTY;  Surgeon: Gaynelle Arabian, MD;  Location: [facility];  Service: Orthopedics;  Laterality: Right;  38mn  ? ? ?OB History   ? ? Gravida  ?1  ? Para  ?1  ? Term  ?1  ? Preterm  ?   ? AB  ?   ? Living  ?1  ?  ? ? SAB  ?   ? IAB  ?   ? Ectopic  ?   ? Multiple  ?   ? Live Births  ?1  ?   ?  ? Obstetric Comments  ?Menarche age 81 menopause 534  HRT x 2-3 yrs  ?  ? ?  ? ? ? ?Home Medications   ? ?Prior to Admission medications   ?Medication Sig Start Date End Date Taking? Authorizing Provider  ?loratadine (CLARITIN) 10 MG tablet Take 1 tablet (10 mg total) by mouth daily for 14 days. 11/27/21 12/11/21 Yes Talbot Grumbling, FNP  ?ANUCORT-HC 25 MG suppository Place 25 mg rectally 2 (two) times daily as needed for hemorrhoids. 12/31/18   [provider]  ?Ascorbic Acid (VITAMIN C) 1000 MG tablet Take 1,000 mg by mouth daily.    [provider]  ?Calcium Carb-Cholecalciferol (CALCIUM 600+D3 PO) Take 1 tablet by mouth 3 (three) times daily.    [provider]  ?levothyroxine (SYNTHROID) 112 MCG tablet Take 112 mcg by mouth See admin instructions. Take 0.5 tablet (56 mcg) by mouth on Sundays & Wednesdays in the morning, take 1 tablet (112 mcg) by mouth on all other days of the week.    [provider]  ?Magnesium Citrate 200 MG TABS Take 200 mg by mouth 2 (two) times daily.    [provider]   ?RESVERATROL PO Take 500 mg by mouth daily.    [provider]  ?Vitamin D, Ergocalciferol, (DRISDOL) 1.25 MG (50000 UNIT) CAPS capsule TAKE 1 CAPSULE BY MOUTH EVERY 7 DAYS 05/04/21   Megan Salon, MD  ? ? ?Family History ?Family History  ?Problem Relation Age of Onset  ? Skin cancer Father 27  ? Stroke Father   ? Stomach cancer Paternal Grandmother 75  ? Breast cancer Paternal Aunt   ?     x 2  ? Breast cancer Cousin   ?     ages 74-60 at dx x 4 paternal  ? Prostate cancer Cousin   ?     ????  ? Brain cancer Maternal Uncle   ?     brain  ? Cancer Cousin   ?     tonsillar  ? Stroke Brother   ? Colon cancer Neg Hx   ? ? ?Social History ?Social History  ? ?Tobacco Use  ? Smoking status: Never  ? Smokeless tobacco: Never  ?Vaping Use  ? Vaping Use: Never used  ?Substance Use Topics  ? Alcohol use: No  ? Drug use: No  ? ? ? ?Allergies   ?Other ? ? ?Review of Systems ?Review of Systems  ?Respiratory:  Positive for cough.   ? ? ?Physical Exam ?Triage Vital Signs ?ED Triage Vitals  ?Enc Vitals Group  ?   BP 11/27/21 1807 (!) 165/74  ?   Pulse Rate 11/27/21 1807 83  ?   Resp 11/27/21 1807 17  ?   Temp 11/27/21 1807 99 ?F (37.2 ?C)  ?   Temp Source 11/27/21 1807 Oral  ?   SpO2 11/27/21 1807 98 %  ?   Weight --   ?   Height --   ?   Head Circumference --   ?   Peak Flow --   ?   Pain Score 11/27/21 1808 5  ?   Pain Loc --   ?   Pain Edu? --   ?   Excl. in [place]? --   ? ?No data found. ? ?Updated Vital Signs ?BP (!) 165/74 (BP Location: Left Arm)   Pulse 83   Temp 99 ?F (37.2 ?C) (Oral)   Resp 17   LMP 08/02/1995   SpO2 98%  ? ?Visual Acuity ?Right Eye Distance:   ?Left  Eye Distance:   ?Bilateral Distance:   ? ?Right Eye Near:   ?Left Eye Near:    ?Bilateral Near:    ? ?Physical Exam ?Vitals and nursing note reviewed.  ?Constitutional:   ?   General: She is not in acute distress. ?   Appearance: Normal appearance. She is well-developed. She is not ill-appearing.  ?HENT:  ?   Head: Normocephalic and atraumatic.  ?    Right Ear: Tympanic membrane, ear canal and external ear normal. No tenderness. Tympanic membrane is not erythematous.  ?   Left Ear: Tympanic membrane, ear canal and external ear normal. No tenderness. Tympanic membrane is not erythematous.  ?   Nose: Rhinorrhea present. Rhinorrhea is clear.  ?   Mouth/Throat:  ?   Lips: Pink.  ?   Mouth: Mucous membranes are moist.  ?   Pharynx: No posterior oropharyngeal erythema.  ?   Tonsils: No tonsillar exudate or tonsillar abscesses. 0 on the right. 0 on the left.  ?   Comments: Minimal post nasal drainage noted to posterior oropharynx. ?Eyes:  ?   Extraocular Movements: Extraocular movements intact.  ?   Conjunctiva/sclera: Conjunctivae normal.  ?   Pupils: Pupils are equal, round, and reactive to light.  ?Cardiovascular:  ?   Rate and Rhythm: Normal rate and regular rhythm.  ?   Heart sounds: Normal heart sounds. No murmur heard. ?  No friction rub. No gallop.  ?Pulmonary:  ?   Effort: Pulmonary effort is normal. No respiratory distress.  ?   Breath sounds: Normal breath sounds. No wheezing, rhonchi or rales.  ?Chest:  ?   Chest wall: No tenderness.  ?Abdominal:  ?   General: Bowel sounds are normal.  ?   Palpations: Abdomen is soft.  ?   Tenderness: There is no abdominal tenderness. There is no right CVA tenderness or left CVA tenderness.  ?Musculoskeletal:     ?   General: No swelling.  ?   Cervical back: Neck supple.  ?Skin: ?   General: Skin is warm and dry.  ?   Capillary Refill: Capillary refill takes less than 2 seconds.  ?   Findings: No rash.  ?Neurological:  ?   General: No focal deficit present.  ?   Mental Status: She is alert and oriented to person, place, and time.  ?Psychiatric:     ?   Mood and Affect: Mood normal.     ?   Behavior: Behavior normal.     ?   Thought Content: Thought content normal.     ?   Judgment: Judgment normal.  ? ? ? ?UC Treatments / Results  ?Labs ?(all labs ordered are listed, but only abnormal results are displayed) ?Labs Reviewed  - No data to display ? ?EKG ? ? ?Radiology ?No results found. ? ?Procedures ?Procedures (including critical care time) ? ?Medications Ordered in UC ?Medications - No data to display ? ?Initial Impression / [place]

## 2021-11-29 DIAGNOSIS — Z1152 Encounter for screening for COVID-19: Secondary | ICD-10-CM | POA: Diagnosis not present

## 2021-11-29 DIAGNOSIS — J020 Streptococcal pharyngitis: Secondary | ICD-10-CM | POA: Diagnosis not present

## 2021-11-29 DIAGNOSIS — R051 Acute cough: Secondary | ICD-10-CM | POA: Diagnosis not present

## 2021-12-20 ENCOUNTER — Encounter: Payer: Self-pay | Admitting: Gastroenterology

## 2021-12-22 DIAGNOSIS — L03111 Cellulitis of right axilla: Secondary | ICD-10-CM | POA: Diagnosis not present

## 2021-12-24 DIAGNOSIS — Z96651 Presence of right artificial knee joint: Secondary | ICD-10-CM | POA: Diagnosis not present

## 2021-12-24 DIAGNOSIS — L03116 Cellulitis of left lower limb: Secondary | ICD-10-CM | POA: Diagnosis not present

## 2022-01-14 DIAGNOSIS — E039 Hypothyroidism, unspecified: Secondary | ICD-10-CM | POA: Diagnosis not present

## 2022-01-14 DIAGNOSIS — E559 Vitamin D deficiency, unspecified: Secondary | ICD-10-CM | POA: Diagnosis not present

## 2022-01-14 DIAGNOSIS — E7800 Pure hypercholesterolemia, unspecified: Secondary | ICD-10-CM | POA: Diagnosis not present

## 2022-01-14 DIAGNOSIS — R7302 Impaired glucose tolerance (oral): Secondary | ICD-10-CM | POA: Diagnosis not present

## 2022-01-21 DIAGNOSIS — R82998 Other abnormal findings in urine: Secondary | ICD-10-CM | POA: Diagnosis not present

## 2022-01-21 DIAGNOSIS — E669 Obesity, unspecified: Secondary | ICD-10-CM | POA: Diagnosis not present

## 2022-01-21 DIAGNOSIS — M818 Other osteoporosis without current pathological fracture: Secondary | ICD-10-CM | POA: Diagnosis not present

## 2022-01-21 DIAGNOSIS — Z0000 Encounter for general adult medical examination without abnormal findings: Secondary | ICD-10-CM | POA: Diagnosis not present

## 2022-01-21 DIAGNOSIS — E039 Hypothyroidism, unspecified: Secondary | ICD-10-CM | POA: Diagnosis not present

## 2022-01-21 DIAGNOSIS — R7302 Impaired glucose tolerance (oral): Secondary | ICD-10-CM | POA: Diagnosis not present

## 2022-01-21 DIAGNOSIS — E559 Vitamin D deficiency, unspecified: Secondary | ICD-10-CM | POA: Diagnosis not present

## 2022-01-21 DIAGNOSIS — K635 Polyp of colon: Secondary | ICD-10-CM | POA: Diagnosis not present

## 2022-01-21 DIAGNOSIS — Z1331 Encounter for screening for depression: Secondary | ICD-10-CM | POA: Diagnosis not present

## 2022-01-21 DIAGNOSIS — Z96651 Presence of right artificial knee joint: Secondary | ICD-10-CM | POA: Diagnosis not present

## 2022-01-21 DIAGNOSIS — M160 Bilateral primary osteoarthritis of hip: Secondary | ICD-10-CM | POA: Diagnosis not present

## 2022-01-21 DIAGNOSIS — R739 Hyperglycemia, unspecified: Secondary | ICD-10-CM | POA: Diagnosis not present

## 2022-01-21 DIAGNOSIS — E7800 Pure hypercholesterolemia, unspecified: Secondary | ICD-10-CM | POA: Diagnosis not present

## 2022-01-21 DIAGNOSIS — Z1339 Encounter for screening examination for other mental health and behavioral disorders: Secondary | ICD-10-CM | POA: Diagnosis not present

## 2022-01-21 DIAGNOSIS — Z170 Estrogen receptor positive status [ER+]: Secondary | ICD-10-CM | POA: Diagnosis not present

## 2022-01-26 DIAGNOSIS — H52203 Unspecified astigmatism, bilateral: Secondary | ICD-10-CM | POA: Diagnosis not present

## 2022-01-26 DIAGNOSIS — Z961 Presence of intraocular lens: Secondary | ICD-10-CM | POA: Diagnosis not present

## 2022-01-26 DIAGNOSIS — H26491 Other secondary cataract, right eye: Secondary | ICD-10-CM | POA: Diagnosis not present

## 2022-01-26 DIAGNOSIS — H4323 Crystalline deposits in vitreous body, bilateral: Secondary | ICD-10-CM | POA: Diagnosis not present

## 2022-03-04 ENCOUNTER — Emergency Department (HOSPITAL_COMMUNITY)
Admission: EM | Admit: 2022-03-04 | Discharge: 2022-03-04 | Disposition: A | Discharge status: Home / Self Care | Payer: Medicare Other | Attending: Emergency Medicine | Admitting: Emergency Medicine

## 2022-03-04 ENCOUNTER — Emergency Department (HOSPITAL_COMMUNITY): Payer: Medicare Other

## 2022-03-04 ENCOUNTER — Encounter (HOSPITAL_COMMUNITY): Payer: Self-pay

## 2022-03-04 ENCOUNTER — Other Ambulatory Visit: Payer: Self-pay

## 2022-03-04 DIAGNOSIS — S0990XA Unspecified injury of head, initial encounter: Secondary | ICD-10-CM | POA: Diagnosis present

## 2022-03-04 DIAGNOSIS — S0001XA Abrasion of scalp, initial encounter: Secondary | ICD-10-CM | POA: Insufficient documentation

## 2022-03-04 DIAGNOSIS — S199XXA Unspecified injury of neck, initial encounter: Secondary | ICD-10-CM | POA: Diagnosis not present

## 2022-03-04 DIAGNOSIS — W19XXXA Unspecified fall, initial encounter: Secondary | ICD-10-CM

## 2022-03-04 DIAGNOSIS — I2510 Atherosclerotic heart disease of native coronary artery without angina pectoris: Secondary | ICD-10-CM | POA: Diagnosis not present

## 2022-03-04 DIAGNOSIS — M25561 Pain in right knee: Secondary | ICD-10-CM | POA: Diagnosis not present

## 2022-03-04 DIAGNOSIS — S0181XA Laceration without foreign body of other part of head, initial encounter: Secondary | ICD-10-CM | POA: Diagnosis not present

## 2022-03-04 DIAGNOSIS — M47812 Spondylosis without myelopathy or radiculopathy, cervical region: Secondary | ICD-10-CM | POA: Diagnosis not present

## 2022-03-04 DIAGNOSIS — W108XXA Fall (on) (from) other stairs and steps, initial encounter: Secondary | ICD-10-CM | POA: Diagnosis not present

## 2022-03-04 DIAGNOSIS — M4312 Spondylolisthesis, cervical region: Secondary | ICD-10-CM | POA: Diagnosis not present

## 2022-03-04 NOTE — ED Triage Notes (Signed)
Pt reports tripping on a stair and falling. Pt reports she hit her head on the side railing of her porch. Pt has lac to top of head and bleeding is controlled. Pt denies LOC and denies taking blood thinners. Pt also endorses R knee pain.

## 2022-03-04 NOTE — Discharge Instructions (Signed)
CT scan of the head and neck demonstrates no intracranial process.  No spinal fractures.  You have a small scalp abrasion.  Please keep this clean.  Apply bacitracin nightly before bed.

## 2022-03-04 NOTE — ED Provider Notes (Signed)
[facility] DEPT Provider Note   CSN: [id number] Arrival date & time: 03/04/22  1616     History  Chief Complaint  Patient presents with   Jamie Cole is a 81 y.o. female.  Patient is an 81 year old female presenting for fall.  Patient states she was walking up the stairs when she missed a step and fell on her right knee, swelling over to her left side, and hit a wooden side rail with the top of her head.  Denies any LOC.  No history of blood thinner use.  Admits to headache but otherwise no sensation or motor dysfunction.  Denies any midline spinal tenderness.  Admits to right-sided knee pain.  The history is provided by the patient. No language interpreter was used.  Fall Associated symptoms include headaches. Pertinent negatives include no chest pain, no abdominal pain and no shortness of breath.       Home Medications Prior to Admission medications   Medication Sig Start Date End Date Taking? Authorizing Provider  ANUCORT-HC 25 MG suppository Place 25 mg rectally 2 (two) times daily as needed for hemorrhoids. 12/31/18   [provider]  Ascorbic Acid (VITAMIN C) 1000 MG tablet Take 1,000 mg by mouth daily.    [provider]  Calcium Carb-Cholecalciferol (CALCIUM 600+D3 PO) Take 1 tablet by mouth 3 (three) times daily.    [provider]  levothyroxine (SYNTHROID) 112 MCG tablet Take 112 mcg by mouth See admin instructions. Take 0.5 tablet (56 mcg) by mouth on Sundays & Wednesdays in the morning, take 1 tablet (112 mcg) by mouth on all other days of the week.    [provider]  loratadine (CLARITIN) 10 MG tablet Take 1 tablet (10 mg total) by mouth daily for 14 days. 11/27/21 12/11/21  Talbot Grumbling, FNP  Magnesium Citrate 200 MG TABS Take 200 mg by mouth 2 (two) times daily.    [provider]  RESVERATROL PO Take 500 mg by mouth daily.    [provider]  Vitamin D,  Ergocalciferol, (DRISDOL) 1.25 MG (50000 UNIT) CAPS capsule TAKE 1 CAPSULE BY MOUTH EVERY 7 DAYS 05/04/21   Megan Salon, MD      Allergies    Other    Review of Systems   Review of Systems  Constitutional:  Negative for chills and fever.  HENT:  Negative for ear pain and sore throat.   Eyes:  Negative for pain and visual disturbance.  Respiratory:  Negative for cough and shortness of breath.   Cardiovascular:  Negative for chest pain and palpitations.  Gastrointestinal:  Negative for abdominal pain and vomiting.  Genitourinary:  Negative for dysuria and hematuria.  Musculoskeletal:  Negative for arthralgias and back pain.  Skin:  Negative for color change and rash.  Neurological:  Positive for headaches. Negative for seizures and syncope.  All other systems reviewed and are negative.   Physical Exam Updated Vital Signs BP (!) 148/76   Pulse 74   Temp 98.2 F (36.8 C) (Oral)   Resp 18   Ht '5\' 4"'$  (1.626 m)   Wt 79.4 kg   LMP 08/02/1995   SpO2 100%   BMI 30.04 kg/m  Physical Exam Vitals and nursing note reviewed.  Constitutional:      General: She is not in acute distress.    Appearance: She is well-developed.  HENT:     Head: Normocephalic.   Eyes:     General:  Lids are normal. Vision grossly intact.     Conjunctiva/sclera: Conjunctivae normal.     Pupils: Pupils are equal, round, and reactive to light.  Cardiovascular:     Rate and Rhythm: Normal rate and regular rhythm.     Heart sounds: No murmur heard. Pulmonary:     Effort: Pulmonary effort is normal. No respiratory distress.     Breath sounds: Normal breath sounds.  Abdominal:     Palpations: Abdomen is soft.     Tenderness: There is no abdominal tenderness.  Musculoskeletal:        General: No swelling.     Cervical back: Neck supple.  Skin:    General: Skin is warm and dry.     Capillary Refill: Capillary refill takes less than 2 seconds.       Neurological:     General: No focal deficit  present.     Mental Status: She is alert and oriented to person, place, and time.     GCS: GCS eye subscore is 4. GCS verbal subscore is 5. GCS motor subscore is 6.     Cranial Nerves: Cranial nerves 2-12 are intact.     Sensory: Sensation is intact.     Motor: Motor function is intact.     Coordination: Coordination is intact.     Gait: Gait is intact.  Psychiatric:        Mood and Affect: Mood normal.     ED Results / Procedures / Treatments   Labs (all labs ordered are listed, but only abnormal results are displayed) Labs Reviewed - No data to display  EKG None  Radiology CT Head Wo Contrast  Result Date: 03/04/2022 CLINICAL DATA:  Trauma.  Fall.  Laceration to the top of the head. EXAM: CT HEAD WITHOUT CONTRAST CT CERVICAL SPINE WITHOUT CONTRAST TECHNIQUE: Multidetector CT imaging of the head and cervical spine was performed following the standard protocol without intravenous contrast. Multiplanar CT image reconstructions of the cervical spine were also generated. RADIATION DOSE REDUCTION: This exam was performed according to the departmental dose-optimization program which includes automated exposure control, adjustment of the mA and/or kV according to patient size and/or use of iterative reconstruction technique. COMPARISON:  Head CT 11/10/2019 FINDINGS: CT HEAD FINDINGS Brain: There is no evidence of an acute infarct, intracranial hemorrhage, mass, midline shift, or extra-axial fluid collection. Hypodensities in the cerebral white matter are stable to slightly increased from the prior CT and are nonspecific but compatible with mild chronic small vessel ischemic disease. Mild cerebral atrophy is within normal limits for age. Vascular: Calcified atherosclerosis at the skull base. No hyperdense vessel. Skull: No fracture or suspicious osseous lesion. Sinuses/Orbits: Visualized paranasal sinuses are clear. Trace right mastoid fluid. Bilateral cataract extraction. Other: Mild scalp soft  tissue swelling at the vertex. CT CERVICAL SPINE FINDINGS Alignment: Mild reversal of the normal lordosis in the lower cervical spine. Minimal anterolisthesis of C4 on C5 and C5 on C6, likely degenerative. Skull base and vertebrae: No acute fracture or suspicious osseous lesion. Soft tissues and spinal canal: No prevertebral fluid or swelling. No visible canal hematoma. Disc levels: Widespread cervical facet arthrosis, asymmetrically severe on the right in the mid and upper cervical spine. Predominantly lower cervical disc degeneration, most advanced at C6-7. No evidence of high-grade spinal canal or neural foraminal stenosis. Upper chest: Clear lung apices. Other: None. IMPRESSION: 1. No evidence of acute intracranial abnormality. 2. Mild scalp swelling at the vertex. 3. No acute cervical spine fracture. Advanced  cervical facet arthrosis. Electronically Signed   By: Logan Bores M.D.   On: 03/04/2022 17:29   CT Cervical Spine Wo Contrast  Result Date: 03/04/2022 CLINICAL DATA:  Trauma.  Fall.  Laceration to the top of the head. EXAM: CT HEAD WITHOUT CONTRAST CT CERVICAL SPINE WITHOUT CONTRAST TECHNIQUE: Multidetector CT imaging of the head and cervical spine was performed following the standard protocol without intravenous contrast. Multiplanar CT image reconstructions of the cervical spine were also generated. RADIATION DOSE REDUCTION: This exam was performed according to the departmental dose-optimization program which includes automated exposure control, adjustment of the mA and/or kV according to patient size and/or use of iterative reconstruction technique. COMPARISON:  Head CT 11/10/2019 FINDINGS: CT HEAD FINDINGS Brain: There is no evidence of an acute infarct, intracranial hemorrhage, mass, midline shift, or extra-axial fluid collection. Hypodensities in the cerebral white matter are stable to slightly increased from the prior CT and are nonspecific but compatible with mild chronic small vessel ischemic  disease. Mild cerebral atrophy is within normal limits for age. Vascular: Calcified atherosclerosis at the skull base. No hyperdense vessel. Skull: No fracture or suspicious osseous lesion. Sinuses/Orbits: Visualized paranasal sinuses are clear. Trace right mastoid fluid. Bilateral cataract extraction. Other: Mild scalp soft tissue swelling at the vertex. CT CERVICAL SPINE FINDINGS Alignment: Mild reversal of the normal lordosis in the lower cervical spine. Minimal anterolisthesis of C4 on C5 and C5 on C6, likely degenerative. Skull base and vertebrae: No acute fracture or suspicious osseous lesion. Soft tissues and spinal canal: No prevertebral fluid or swelling. No visible canal hematoma. Disc levels: Widespread cervical facet arthrosis, asymmetrically severe on the right in the mid and upper cervical spine. Predominantly lower cervical disc degeneration, most advanced at C6-7. No evidence of high-grade spinal canal or neural foraminal stenosis. Upper chest: Clear lung apices. Other: None. IMPRESSION: 1. No evidence of acute intracranial abnormality. 2. Mild scalp swelling at the vertex. 3. No acute cervical spine fracture. Advanced cervical facet arthrosis. Electronically Signed   By: Logan Bores M.D.   On: 03/04/2022 17:29   DG Knee Complete 4 Views Right  Result Date: 03/04/2022 CLINICAL DATA:  Pain following fall EXAM: RIGHT KNEE - COMPLETE 4+ VIEW COMPARISON:  None Available. FINDINGS: Right TKA. No radiographic evidence of loosening. No fracture or dislocation. No significant joint effusion. IMPRESSION: Right TKA.  No acute abnormality. Electronically Signed   By: Placido Sou M.D.   On: 03/04/2022 17:14    Procedures Procedures    Medications Ordered in ED Medications - No data to display  ED Course/ Medical Decision Making/ A&P                           Medical Decision Making Amount and/or Complexity of Data Reviewed Radiology: ordered.   42:53 PM 81 year old female presenting for  fall.  Patient states she was walking up the stairs when she missed a step and fell on her right knee, swelling over to her left side, and hit a wooden side rail with the top of her head.  Patient is alert and oriented x3, no acute distress, afebrile, stable vital signs.  Physical exam demonstrates abrasions to the top of the scalp.  Wound irrigated and cleaned with tap water.  Bacitracin applied.  Tdap up-to-date 8 years ago.  No need for sutures or staples.  CT head and neck demonstrates no acute process.  Patient admits to right-sided knee pain.  No abrasions.  X-ray demonstrates no fractures.  Patient is otherwise well-appearing at this time with no signs or symptoms of a concussion.  Describes headache with the focal point being at her scalp abrasion and scalp swelling.  Patient is otherwise neurovascularly intact with a stable gait and safe for discharge at this time.  Patient in no distress and overall condition improved here in the ED. Detailed discussions were had with the patient regarding current findings, and need for close f/u with PCP or on call doctor. The patient has been instructed to return immediately if the symptoms worsen in any way for re-evaluation. Patient verbalized understanding and is in agreement with current care plan. All questions answered prior to discharge.         Final Clinical Impression(s) / ED Diagnoses Final diagnoses:  Fall, initial encounter  Abrasion of scalp, initial encounter    Rx / DC Orders ED Discharge Orders     None         Lianne Cure, DO 22/02/54 1814

## 2022-03-04 NOTE — ED Provider Triage Note (Signed)
Emergency Medicine Provider Triage Evaluation Note  Jamie Cole , a 81 y.o. female  was evaluated in triage.  Pt complains of fall as she was going up a flight of steps.  She fell onto her right knee, and got swung around and hit the head on a piece of 2 x 4.  Has a laceration to top of head.  Denies loss of consciousness.  Is not on anticoagulation..  Review of Systems  Positive: As above Negative: As above  Physical Exam  BP (!) 184/77   Pulse 91   Temp (!) 97.4 F (36.3 C) (Oral)   Resp 18   Ht '5\' 4"'$  (1.626 m)   Wt 79.4 kg   LMP 08/02/1995   SpO2 99%   BMI 30.04 kg/m  Gen:   Awake, no distress   Resp:  Normal effort  MSK:   Moves extremities without difficulty  Other:    Medical Decision Making  Medically screening exam initiated at 4:49 PM.  Appropriate orders placed.  Mittie Knittel Rief was informed that the remainder of the evaluation will be completed by another provider, this initial triage assessment does not replace that evaluation, and the importance of remaining in the ED until their evaluation is complete.     Evlyn Courier, PA-C 03/04/22 1651

## 2022-03-08 DIAGNOSIS — M25552 Pain in left hip: Secondary | ICD-10-CM | POA: Diagnosis not present

## 2022-03-15 ENCOUNTER — Emergency Department (HOSPITAL_COMMUNITY): Payer: Medicare Other

## 2022-03-15 ENCOUNTER — Emergency Department (HOSPITAL_COMMUNITY)
Admission: EM | Admit: 2022-03-15 | Discharge: 2022-03-15 | Disposition: A | Discharge status: Home / Self Care | Payer: Medicare Other | Attending: Emergency Medicine | Admitting: Emergency Medicine

## 2022-03-15 ENCOUNTER — Encounter (HOSPITAL_COMMUNITY): Payer: Self-pay

## 2022-03-15 ENCOUNTER — Other Ambulatory Visit: Payer: Self-pay

## 2022-03-15 DIAGNOSIS — M25552 Pain in left hip: Secondary | ICD-10-CM | POA: Diagnosis not present

## 2022-03-15 DIAGNOSIS — W19XXXA Unspecified fall, initial encounter: Secondary | ICD-10-CM

## 2022-03-15 DIAGNOSIS — R0789 Other chest pain: Secondary | ICD-10-CM | POA: Insufficient documentation

## 2022-03-15 DIAGNOSIS — W1830XA Fall on same level, unspecified, initial encounter: Secondary | ICD-10-CM | POA: Insufficient documentation

## 2022-03-15 DIAGNOSIS — R0781 Pleurodynia: Secondary | ICD-10-CM | POA: Diagnosis not present

## 2022-03-15 DIAGNOSIS — Z043 Encounter for examination and observation following other accident: Secondary | ICD-10-CM | POA: Diagnosis not present

## 2022-03-15 NOTE — ED Triage Notes (Signed)
Pt reports she was here on 8/4 for a fall. Pt reports they scanned her head, but is still having right rib cage and left hip pain.

## 2022-03-15 NOTE — ED Provider Triage Note (Signed)
Emergency Medicine Provider Triage Evaluation Note  Jamie Cole , a 81 y.o. female  was evaluated in triage.  Pt complains of right rib pain and hip pain, fell 10 days ago when she stumped her toe. Seen here at that time and head checked out ok but continues to have pain in her left lateral hip and right lower ribs. . Able to ambulate Review of Systems  Positive: Rib pain, hip pain Negative: Fever, cough, abdominal pain   Physical Exam  BP 139/76 (BP Location: Left Arm)   Pulse 85   Temp 97.9 F (36.6 C) (Oral)   Resp 16   Ht '5\' 4"'$  (1.626 m)   Wt 82.9 kg   LMP 08/02/1995   SpO2 99%   BMI 31.38 kg/m  Gen:   Awake, no distress   Resp:  Normal effort  MSK:   Moves extremities without difficulty  Other:    Medical Decision Making  Medically screening exam initiated at 9:15 AM.  Appropriate orders placed.  Virginie Josten Madej was informed that the remainder of the evaluation will be completed by another provider, this initial triage assessment does not replace that evaluation, and the importance of remaining in the ED until their evaluation is complete.     Tacy Learn, PA-C 03/15/22 [phone]

## 2022-03-15 NOTE — Discharge Instructions (Addendum)
Consider using a cane for a few days for extra stability on your left side with your pain in your left hip.  Contact your doctors office schedule follow-up appointment.  You can use heating packs as needed to continue taking ibuprofen or Tylenol for pain at home.

## 2022-03-15 NOTE — ED Provider Notes (Signed)
[facility] DEPT Provider Note   CSN: [id number] Arrival date & time: 03/15/22  0092     History  Chief Complaint  Patient presents with   Jamie Cole is a 81 y.o. female presenting to the ED with a fall that occurred over 10 days ago, on 4 August, reporting now she is having persistent pain in her left hip and her right lower ribs.  She was seen in the ED at that time after her initial fall and had CT scan of the head and C-spine which was unremarkable, x-ray of the knee which showed no acute traumatic injuries.  She subsequently reports she has had a twinge and pain in her left hip, worse with sitting down on the bed, for 10 days, and also some discomfort in her right lower rib line.  This is worse at night.  Not reproducible worse with inspiration.  She just wanted to come "get checked out".  She is not on blood thinners  HPI     Home Medications Prior to Admission medications   Medication Sig Start Date End Date Taking? Authorizing Provider  ANUCORT-HC 25 MG suppository Place 25 mg rectally 2 (two) times daily as needed for hemorrhoids. 12/31/18   [provider]  Ascorbic Acid (VITAMIN C) 1000 MG tablet Take 1,000 mg by mouth daily.    [provider]  Calcium Carb-Cholecalciferol (CALCIUM 600+D3 PO) Take 1 tablet by mouth 3 (three) times daily.    [provider]  levothyroxine (SYNTHROID) 112 MCG tablet Take 112 mcg by mouth See admin instructions. Take 0.5 tablet (56 mcg) by mouth on Sundays & Wednesdays in the morning, take 1 tablet (112 mcg) by mouth on all other days of the week.    [provider]  loratadine (CLARITIN) 10 MG tablet Take 1 tablet (10 mg total) by mouth daily for 14 days. 11/27/21 12/11/21  Talbot Grumbling, FNP  Magnesium Citrate 200 MG TABS Take 200 mg by mouth 2 (two) times daily.    [provider]  RESVERATROL PO Take 500 mg by mouth daily.    [provider]   Vitamin D, Ergocalciferol, (DRISDOL) 1.25 MG (50000 UNIT) CAPS capsule TAKE 1 CAPSULE BY MOUTH EVERY 7 DAYS 05/04/21   Megan Salon, MD      Allergies    Other    Review of Systems   Review of Systems  Physical Exam Updated Vital Signs BP 139/76 (BP Location: Left Arm)   Pulse 85   Temp 97.9 F (36.6 C) (Oral)   Resp 16   Ht '5\' 4"'$  (1.626 m)   Wt 82.9 kg   LMP 08/02/1995   SpO2 99%   BMI 31.38 kg/m  Physical Exam Constitutional:      General: She is not in acute distress.    Appearance: She is obese.  HENT:     Head: Normocephalic and atraumatic.  Eyes:     Conjunctiva/sclera: Conjunctivae normal.     Pupils: Pupils are equal, round, and reactive to light.  Cardiovascular:     Rate and Rhythm: Normal rate and regular rhythm.  Pulmonary:     Effort: Pulmonary effort is normal. No respiratory distress.  Abdominal:     General: There is no distension.     Tenderness: There is no abdominal tenderness.  Musculoskeletal:     Comments: No reproducible right-sided rib line pain or chest wall tenderness, negative Murphy sign Focal tenderness of the  left lateral hip near the hip joint, patient is able to ambulate and provide full range of motion at the hip  Skin:    General: Skin is warm and dry.  Neurological:     General: No focal deficit present.     Mental Status: She is alert. Mental status is at baseline.  Psychiatric:        Mood and Affect: Mood normal.        Behavior: Behavior normal.     ED Results / Procedures / Treatments   Labs (all labs ordered are listed, but only abnormal results are displayed) Labs Reviewed - No data to display  EKG None  Radiology DG Ribs Unilateral W/Chest Right  Result Date: 03/15/2022 CLINICAL DATA:  Fall, pain EXAM: RIGHT RIBS AND CHEST - 3+ VIEW COMPARISON:  09/12/2011 FINDINGS: No displaced fracture or other bone lesions are seen involving the ribs. There is no evidence of pneumothorax or pleural effusion. Both lungs are  clear. Heart size and mediastinal contours are within normal limits. IMPRESSION: No displaced fracture or other radiographic abnormality of the right ribs to explain pain. Electronically Signed   By: Delanna Ahmadi M.D.   On: 03/15/2022 10:52   DG Hip Unilat With Pelvis 2-3 Views Left  Result Date: 03/15/2022 CLINICAL DATA:  Fall 03/04/2022 EXAM: DG HIP (WITH OR WITHOUT PELVIS) 2-3V LEFT COMPARISON:  None Available. FINDINGS: There is no evidence of hip fracture or dislocation. There is no evidence of arthropathy or other focal bone abnormality. IMPRESSION: Negative. Electronically Signed   By: Franchot Gallo M.D.   On: 03/15/2022 10:49    Procedures Procedures    Medications Ordered in ED Medications - No data to display  ED Course/ Medical Decision Making/ A&P                           Medical Decision Making  Patient is presenting with focal tenderness of the left hip and the right lower chest after a fall.  Her vital signs are otherwise normal.  X-rays of the hip and chest were ordered and personally reviewed and interpreted, notable for no acute traumatic injuries.  She was able to ambulate and bear weight on her legs steadily, the lower suspicion for hairline fracture, or for surgical or emergent hip fracture at this time.  Likewise I have a lower suspicion for pneumonia.  I advised PCP follow-up.  She verbalized understanding.        Final Clinical Impression(s) / ED Diagnoses Final diagnoses:  Fall, initial encounter  Left hip pain  Right-sided chest wall pain    Rx / DC Orders ED Discharge Orders     None         Wyvonnia Dusky, MD 03/15/22 1110

## 2022-04-23 DIAGNOSIS — M79674 Pain in right toe(s): Secondary | ICD-10-CM | POA: Diagnosis not present

## 2022-05-02 DIAGNOSIS — E7800 Pure hypercholesterolemia, unspecified: Secondary | ICD-10-CM | POA: Diagnosis not present

## 2022-05-02 DIAGNOSIS — E039 Hypothyroidism, unspecified: Secondary | ICD-10-CM | POA: Diagnosis not present

## 2022-05-21 DIAGNOSIS — M79674 Pain in right toe(s): Secondary | ICD-10-CM | POA: Diagnosis not present

## 2022-07-20 DIAGNOSIS — M25552 Pain in left hip: Secondary | ICD-10-CM | POA: Diagnosis not present

## 2022-08-10 DIAGNOSIS — E559 Vitamin D deficiency, unspecified: Secondary | ICD-10-CM | POA: Diagnosis not present

## 2022-08-10 DIAGNOSIS — C50411 Malignant neoplasm of upper-outer quadrant of right female breast: Secondary | ICD-10-CM | POA: Diagnosis not present

## 2022-08-10 DIAGNOSIS — D0590 Unspecified type of carcinoma in situ of unspecified breast: Secondary | ICD-10-CM | POA: Diagnosis not present

## 2022-08-10 DIAGNOSIS — R7302 Impaired glucose tolerance (oral): Secondary | ICD-10-CM | POA: Diagnosis not present

## 2022-08-10 DIAGNOSIS — M160 Bilateral primary osteoarthritis of hip: Secondary | ICD-10-CM | POA: Diagnosis not present

## 2022-08-10 DIAGNOSIS — Z96651 Presence of right artificial knee joint: Secondary | ICD-10-CM | POA: Diagnosis not present

## 2022-08-10 DIAGNOSIS — E039 Hypothyroidism, unspecified: Secondary | ICD-10-CM | POA: Diagnosis not present

## 2022-08-10 DIAGNOSIS — K635 Polyp of colon: Secondary | ICD-10-CM | POA: Diagnosis not present

## 2022-08-10 DIAGNOSIS — E7800 Pure hypercholesterolemia, unspecified: Secondary | ICD-10-CM | POA: Diagnosis not present

## 2022-08-10 DIAGNOSIS — Z170 Estrogen receptor positive status [ER+]: Secondary | ICD-10-CM | POA: Diagnosis not present

## 2022-08-10 DIAGNOSIS — M818 Other osteoporosis without current pathological fracture: Secondary | ICD-10-CM | POA: Diagnosis not present

## 2022-08-10 DIAGNOSIS — E669 Obesity, unspecified: Secondary | ICD-10-CM | POA: Diagnosis not present

## 2022-08-11 ENCOUNTER — Other Ambulatory Visit: Payer: Self-pay | Admitting: Internal Medicine

## 2022-08-11 DIAGNOSIS — M810 Age-related osteoporosis without current pathological fracture: Secondary | ICD-10-CM

## 2022-08-17 ENCOUNTER — Other Ambulatory Visit: Payer: Self-pay | Admitting: Obstetrics & Gynecology

## 2022-08-17 DIAGNOSIS — Z1231 Encounter for screening mammogram for malignant neoplasm of breast: Secondary | ICD-10-CM

## 2022-11-29 DIAGNOSIS — Z85828 Personal history of other malignant neoplasm of skin: Secondary | ICD-10-CM | POA: Diagnosis not present

## 2022-11-29 DIAGNOSIS — L578 Other skin changes due to chronic exposure to nonionizing radiation: Secondary | ICD-10-CM | POA: Diagnosis not present

## 2022-11-29 DIAGNOSIS — D225 Melanocytic nevi of trunk: Secondary | ICD-10-CM | POA: Diagnosis not present

## 2022-11-29 DIAGNOSIS — L820 Inflamed seborrheic keratosis: Secondary | ICD-10-CM | POA: Diagnosis not present

## 2022-11-29 DIAGNOSIS — Z86018 Personal history of other benign neoplasm: Secondary | ICD-10-CM | POA: Diagnosis not present

## 2022-11-29 DIAGNOSIS — L821 Other seborrheic keratosis: Secondary | ICD-10-CM | POA: Diagnosis not present

## 2022-11-29 DIAGNOSIS — L814 Other melanin hyperpigmentation: Secondary | ICD-10-CM | POA: Diagnosis not present

## 2023-01-10 DIAGNOSIS — L820 Inflamed seborrheic keratosis: Secondary | ICD-10-CM | POA: Diagnosis not present

## 2023-01-23 ENCOUNTER — Ambulatory Visit
Admission: RE | Admit: 2023-01-23 | Discharge: 2023-01-23 | Disposition: A | Discharge status: Home / Self Care | Payer: Medicare Other | Source: Ambulatory Visit | Attending: Obstetrics & Gynecology | Admitting: Obstetrics & Gynecology

## 2023-01-23 ENCOUNTER — Ambulatory Visit
Admission: RE | Admit: 2023-01-23 | Discharge: 2023-01-23 | Disposition: A | Discharge status: Home / Self Care | Payer: Medicare Other | Source: Ambulatory Visit | Attending: Internal Medicine | Admitting: Internal Medicine

## 2023-01-23 DIAGNOSIS — N958 Other specified menopausal and perimenopausal disorders: Secondary | ICD-10-CM | POA: Diagnosis not present

## 2023-01-23 DIAGNOSIS — Z1231 Encounter for screening mammogram for malignant neoplasm of breast: Secondary | ICD-10-CM | POA: Diagnosis not present

## 2023-01-23 DIAGNOSIS — M810 Age-related osteoporosis without current pathological fracture: Secondary | ICD-10-CM

## 2023-01-23 DIAGNOSIS — E349 Endocrine disorder, unspecified: Secondary | ICD-10-CM | POA: Diagnosis not present

## 2023-01-24 DIAGNOSIS — H52203 Unspecified astigmatism, bilateral: Secondary | ICD-10-CM | POA: Diagnosis not present

## 2023-01-24 DIAGNOSIS — Z961 Presence of intraocular lens: Secondary | ICD-10-CM | POA: Diagnosis not present

## 2023-01-24 DIAGNOSIS — H4323 Crystalline deposits in vitreous body, bilateral: Secondary | ICD-10-CM | POA: Diagnosis not present

## 2023-01-24 DIAGNOSIS — H26491 Other secondary cataract, right eye: Secondary | ICD-10-CM | POA: Diagnosis not present

## 2023-02-07 DIAGNOSIS — E039 Hypothyroidism, unspecified: Secondary | ICD-10-CM | POA: Diagnosis not present

## 2023-02-07 DIAGNOSIS — E7800 Pure hypercholesterolemia, unspecified: Secondary | ICD-10-CM | POA: Diagnosis not present

## 2023-02-07 DIAGNOSIS — R7302 Impaired glucose tolerance (oral): Secondary | ICD-10-CM | POA: Diagnosis not present

## 2023-02-07 DIAGNOSIS — R739 Hyperglycemia, unspecified: Secondary | ICD-10-CM | POA: Diagnosis not present

## 2023-02-07 DIAGNOSIS — M818 Other osteoporosis without current pathological fracture: Secondary | ICD-10-CM | POA: Diagnosis not present

## 2023-02-07 DIAGNOSIS — E559 Vitamin D deficiency, unspecified: Secondary | ICD-10-CM | POA: Diagnosis not present

## 2023-02-14 DIAGNOSIS — R82998 Other abnormal findings in urine: Secondary | ICD-10-CM | POA: Diagnosis not present

## 2023-02-14 DIAGNOSIS — R7302 Impaired glucose tolerance (oral): Secondary | ICD-10-CM | POA: Diagnosis not present

## 2023-02-14 DIAGNOSIS — Z1339 Encounter for screening examination for other mental health and behavioral disorders: Secondary | ICD-10-CM | POA: Diagnosis not present

## 2023-02-14 DIAGNOSIS — Z96651 Presence of right artificial knee joint: Secondary | ICD-10-CM | POA: Diagnosis not present

## 2023-02-14 DIAGNOSIS — M818 Other osteoporosis without current pathological fracture: Secondary | ICD-10-CM | POA: Diagnosis not present

## 2023-02-14 DIAGNOSIS — E7800 Pure hypercholesterolemia, unspecified: Secondary | ICD-10-CM | POA: Diagnosis not present

## 2023-02-14 DIAGNOSIS — E663 Overweight: Secondary | ICD-10-CM | POA: Diagnosis not present

## 2023-02-14 DIAGNOSIS — C50411 Malignant neoplasm of upper-outer quadrant of right female breast: Secondary | ICD-10-CM | POA: Diagnosis not present

## 2023-02-14 DIAGNOSIS — Z0000 Encounter for general adult medical examination without abnormal findings: Secondary | ICD-10-CM | POA: Diagnosis not present

## 2023-02-14 DIAGNOSIS — M160 Bilateral primary osteoarthritis of hip: Secondary | ICD-10-CM | POA: Diagnosis not present

## 2023-02-14 DIAGNOSIS — Z1331 Encounter for screening for depression: Secondary | ICD-10-CM | POA: Diagnosis not present

## 2023-02-14 DIAGNOSIS — Z170 Estrogen receptor positive status [ER+]: Secondary | ICD-10-CM | POA: Diagnosis not present

## 2023-02-14 DIAGNOSIS — E039 Hypothyroidism, unspecified: Secondary | ICD-10-CM | POA: Diagnosis not present

## 2023-02-21 ENCOUNTER — Other Ambulatory Visit (HOSPITAL_COMMUNITY): Payer: Self-pay | Admitting: *Deleted

## 2023-02-23 ENCOUNTER — Ambulatory Visit (HOSPITAL_COMMUNITY)
Admission: RE | Admit: 2023-02-23 | Discharge: 2023-02-23 | Disposition: A | Discharge status: Home / Self Care | Payer: Medicare Other | Source: Ambulatory Visit | Attending: Internal Medicine | Admitting: Internal Medicine

## 2023-02-23 DIAGNOSIS — Z170 Estrogen receptor positive status [ER+]: Secondary | ICD-10-CM | POA: Insufficient documentation

## 2023-02-23 DIAGNOSIS — T386X5A Adverse effect of antigonadotrophins, antiestrogens, antiandrogens, not elsewhere classified, initial encounter: Secondary | ICD-10-CM | POA: Diagnosis not present

## 2023-02-23 DIAGNOSIS — M818 Other osteoporosis without current pathological fracture: Secondary | ICD-10-CM | POA: Diagnosis not present

## 2023-02-23 DIAGNOSIS — C50411 Malignant neoplasm of upper-outer quadrant of right female breast: Secondary | ICD-10-CM | POA: Diagnosis not present

## 2023-02-23 MED ORDER — DENOSUMAB 60 MG/ML ~~LOC~~ SOSY
PREFILLED_SYRINGE | SUBCUTANEOUS | Status: AC
  Filled 2023-02-23: qty 1

## 2023-02-23 MED ORDER — DENOSUMAB 60 MG/ML ~~LOC~~ SOSY
60.0000 mg | PREFILLED_SYRINGE | Freq: Once | SUBCUTANEOUS | Status: AC
  Administered 2023-02-23: 60 mg via SUBCUTANEOUS

## 2023-03-01 DIAGNOSIS — L820 Inflamed seborrheic keratosis: Secondary | ICD-10-CM | POA: Diagnosis not present

## 2023-03-01 DIAGNOSIS — L570 Actinic keratosis: Secondary | ICD-10-CM | POA: Diagnosis not present

## 2023-03-01 DIAGNOSIS — M7130 Other bursal cyst, unspecified site: Secondary | ICD-10-CM | POA: Diagnosis not present

## 2023-04-08 DIAGNOSIS — R197 Diarrhea, unspecified: Secondary | ICD-10-CM | POA: Diagnosis not present

## 2023-04-17 DIAGNOSIS — L820 Inflamed seborrheic keratosis: Secondary | ICD-10-CM | POA: Diagnosis not present

## 2023-04-26 ENCOUNTER — Emergency Department (HOSPITAL_COMMUNITY)
Admission: EM | Admit: 2023-04-26 | Discharge: 2023-04-26 | Disposition: A | Discharge status: Home / Self Care | Payer: Medicare Other

## 2023-04-26 ENCOUNTER — Emergency Department (HOSPITAL_COMMUNITY): Payer: Medicare Other

## 2023-04-26 ENCOUNTER — Other Ambulatory Visit: Payer: Self-pay

## 2023-04-26 DIAGNOSIS — Z853 Personal history of malignant neoplasm of breast: Secondary | ICD-10-CM | POA: Insufficient documentation

## 2023-04-26 DIAGNOSIS — S0181XA Laceration without foreign body of other part of head, initial encounter: Secondary | ICD-10-CM

## 2023-04-26 DIAGNOSIS — S01111A Laceration without foreign body of right eyelid and periocular area, initial encounter: Secondary | ICD-10-CM | POA: Diagnosis not present

## 2023-04-26 DIAGNOSIS — Z471 Aftercare following joint replacement surgery: Secondary | ICD-10-CM | POA: Diagnosis not present

## 2023-04-26 DIAGNOSIS — M25561 Pain in right knee: Secondary | ICD-10-CM | POA: Diagnosis not present

## 2023-04-26 DIAGNOSIS — M19041 Primary osteoarthritis, right hand: Secondary | ICD-10-CM | POA: Diagnosis not present

## 2023-04-26 DIAGNOSIS — I6789 Other cerebrovascular disease: Secondary | ICD-10-CM | POA: Insufficient documentation

## 2023-04-26 DIAGNOSIS — Z96651 Presence of right artificial knee joint: Secondary | ICD-10-CM | POA: Diagnosis not present

## 2023-04-26 DIAGNOSIS — W01198A Fall on same level from slipping, tripping and stumbling with subsequent striking against other object, initial encounter: Secondary | ICD-10-CM | POA: Diagnosis not present

## 2023-04-26 DIAGNOSIS — S199XXA Unspecified injury of neck, initial encounter: Secondary | ICD-10-CM | POA: Diagnosis not present

## 2023-04-26 DIAGNOSIS — I10 Essential (primary) hypertension: Secondary | ICD-10-CM | POA: Diagnosis not present

## 2023-04-26 DIAGNOSIS — W19XXXA Unspecified fall, initial encounter: Secondary | ICD-10-CM

## 2023-04-26 DIAGNOSIS — M47812 Spondylosis without myelopathy or radiculopathy, cervical region: Secondary | ICD-10-CM | POA: Diagnosis not present

## 2023-04-26 DIAGNOSIS — M47811 Spondylosis without myelopathy or radiculopathy, occipito-atlanto-axial region: Secondary | ICD-10-CM | POA: Diagnosis not present

## 2023-04-26 DIAGNOSIS — E039 Hypothyroidism, unspecified: Secondary | ICD-10-CM | POA: Diagnosis not present

## 2023-04-26 DIAGNOSIS — M79641 Pain in right hand: Secondary | ICD-10-CM | POA: Diagnosis not present

## 2023-04-26 DIAGNOSIS — M4802 Spinal stenosis, cervical region: Secondary | ICD-10-CM | POA: Diagnosis not present

## 2023-04-26 DIAGNOSIS — S0993XA Unspecified injury of face, initial encounter: Secondary | ICD-10-CM | POA: Diagnosis present

## 2023-04-26 MED ORDER — LIDOCAINE-EPINEPHRINE-TETRACAINE (LET) TOPICAL GEL
3.0000 mL | Freq: Once | TOPICAL | Status: AC
  Administered 2023-04-26: 3 mL via TOPICAL
  Filled 2023-04-26: qty 3

## 2023-04-26 MED ORDER — LIDOCAINE-EPINEPHRINE (PF) 2 %-1:200000 IJ SOLN
10.0000 mL | Freq: Once | INTRAMUSCULAR | Status: DC
  Filled 2023-04-26: qty 20

## 2023-04-26 NOTE — ED Triage Notes (Addendum)
Laceration to right brow, pt states had mechanical fall pta. No LOC, no blood thinners

## 2023-04-26 NOTE — ED Provider Notes (Signed)
Care assumed from Sherian Maroon, PA-C at shift change. Please see their note for further information.  Briefly: Patient with mechanical fall not on thinners immediately prior to arrival today. No LOC, ambulatory afterwards. Lac to the right upper eyebrow that has been repaired by previous provider.   Plan: CT head and neck ordered and performed which are benign, knee x-ray benign as well. Right hand x-ray pending. After this results suspect patient should be able to be discharged. Her pain has been controlled without analgesia here and she has walked to the bathroom several times without assistance.   5:30PM:  Hand x-ray has resulted and reveals  1. Linear ossific density adjacent to base of first proximal phalanx, indeterminate for soft tissue calcification or small cortical fracture, correlate for point tenderness. 2. Degenerative changes.  I have personally reviewed and interpreted this imaging and agree with radiology interpretation.  Upon my physical exam, patient has no point tenderness at the base of the first proximal phalanx.  Her tenderness is exclusively to the base of the fifth phalanx area as well as the lateral dorsal right hand.  I have discussed the x-ray findings with the patient who is in agreement with this.  Will give referral to orthopedics to follow-up with.  I offered a knee brace as well as a wrist brace for support which she declined needing.  She is ready to go home and her pain continues to be controlled. Evaluation and diagnostic testing in the emergency department does not suggest an emergent condition requiring admission or immediate intervention beyond what has been performed at this time.  Plan for discharge with close PCP follow-up.  Patient is understanding and amenable with plan, educated on red flag symptoms that would prompt immediate return.  Patient discharged in stable condition.    Vear Clock 04/26/23 1752    Durwin Glaze, MD 04/27/23  Marlyne Beards

## 2023-04-26 NOTE — ED Provider Notes (Signed)
EMERGENCY DEPARTMENT AT [facility] Provider Note   CSN: [id number] Arrival date & time: 04/26/23  1213     History {Add pertinent medical, surgical, social history, OB history to HPI:1} Chief Complaint  Patient presents with   Laceration    Jamie Cole is a 82 y.o. female.   Laceration   83 year old female presents emergency department after mechanical fall.  Patient states that she was walking up the steps of her back deck when she tripped and fell forward striking her head on the wooden deck and hitting her right knee as well as right wrist.  Patient denies loss of consciousness, blood thinner use.  Was able to get up moments after the incident occurred independently and has been walking at baseline.  States she was concerned that the cut above her right eyebrow may "need the stitches" prompting visit emergency department.  Denies any chest pain, shortness of breath, abdominal pain, nausea, vomiting, visual disturbance, gait abnormality, slurred speech, facial droop, weakness/sensory deficits in upper or lower extremities.  Past medical history significant for breast cancer, osteoporosis, hypothyroidism, OA  Home Medications Prior to Admission medications   Medication Sig Start Date End Date Taking? Authorizing Provider  ANUCORT-HC 25 MG suppository Place 25 mg rectally 2 (two) times daily as needed for hemorrhoids. 12/31/18   [provider]  Ascorbic Acid (VITAMIN C) 1000 MG tablet Take 1,000 mg by mouth daily.    [provider]  Calcium Carb-Cholecalciferol (CALCIUM 600+D3 PO) Take 1 tablet by mouth 3 (three) times daily.    [provider]  levothyroxine (SYNTHROID) 112 MCG tablet Take 112 mcg by mouth See admin instructions. Take 0.5 tablet (56 mcg) by mouth on Sundays & Wednesdays in the morning, take 1 tablet (112 mcg) by mouth on all other days of the week.    [provider]  loratadine (CLARITIN) 10 MG tablet  Take 1 tablet (10 mg total) by mouth daily for 14 days. 11/27/21 12/11/21  Carlisle Beers, FNP  Magnesium Citrate 200 MG TABS Take 200 mg by mouth 2 (two) times daily.    [provider]  RESVERATROL PO Take 500 mg by mouth daily.    [provider]  Vitamin D, Ergocalciferol, (DRISDOL) 1.25 MG (50000 UNIT) CAPS capsule TAKE 1 CAPSULE BY MOUTH EVERY 7 DAYS 05/04/21   Jerene Bears, MD      Allergies    Other    Review of Systems   Review of Systems  All other systems reviewed and are negative.   Physical Exam Updated Vital Signs BP (!) 140/73 (BP Location: Left Arm)   Pulse 88   Temp 97.8 F (36.6 C) (Oral)   Resp 16   Ht 5\' 4"  (1.626 m)   Wt 82.9 kg   LMP 08/02/1995   SpO2 99%   BMI 31.37 kg/m  Physical Exam Vitals and nursing note reviewed.  Constitutional:      General: She is not in acute distress.    Appearance: She is well-developed.  HENT:     Head: Normocephalic.     Comments: What appears to be skin avulsion 2.5 cm in length above right eyebrow. Eyes:     Conjunctiva/sclera: Conjunctivae normal.  Cardiovascular:     Rate and Rhythm: Normal rate and regular rhythm.  Pulmonary:     Effort: Pulmonary effort is normal. No respiratory distress.     Breath sounds: Normal breath sounds.  Abdominal:     Palpations:  Abdomen is soft.     Tenderness: There is no abdominal tenderness.  Musculoskeletal:        General: No swelling.     Cervical back: Neck supple.     Comments: No midline tenderness of cervical, thoracic, lumbar spine with no obvious step-off or deformity noted.  Patient without tenderness to palpation along upper extremities bilaterally.  Mild tenderness of medial joint line of right knee otherwise, no tenderness of bilateral lower extremities.  No chest wall tenderness to palpation.  Skin:    General: Skin is warm and dry.     Capillary Refill: Capillary refill takes less than 2 seconds.  Neurological:     Mental Status: She is  alert.     Comments: Alert and oriented to self, place, time and event.   Speech is fluent, clear without dysarthria or dysphasia.   Strength symmetric in upper/lower extremities   Sensation intact in upper/lower extremities   Normal gait.  CN I not tested  CN II not tested CN III, IV, VI PERRLA and EOMs intact bilaterally  CN V Intact sensation to sharp and light touch to the face  CN VII facial movements symmetric  CN VIII not tested  CN IX, X no uvula deviation, symmetric rise of soft palate  CN XI symmetric SCM and trapezius strength bilaterally  CN XII Midline tongue protrusion, symmetric L/R movements     Psychiatric:        Mood and Affect: Mood normal.     ED Results / Procedures / Treatments   Labs (all labs ordered are listed, but only abnormal results are displayed) Labs Reviewed - No data to display  EKG None  Radiology No results found.  Procedures .Marland KitchenLaceration Repair  Date/Time: 04/26/2023 2:47 PM  Performed by: Peter Garter, PA Authorized by: Peter Garter, PA   Consent:    Consent obtained:  Verbal   Consent given by:  Patient   Risks, benefits, and alternatives were discussed: yes     Risks discussed:  Infection, need for additional repair, nerve damage, poor wound healing, poor cosmetic result, pain, retained foreign body, tendon damage and vascular damage   Alternatives discussed:  Delayed treatment, observation and no treatment Universal protocol:    Procedure explained and questions answered to patient or proxy's satisfaction: yes     Patient identity confirmed:  Verbally with patient and arm band Anesthesia:    Anesthesia method:  Topical application Laceration details:    Location:  Face   Face location:  R eyebrow   Length (cm):  3 Exploration:    Limited defect created (wound extended): no     Hemostasis achieved with:  Direct pressure   Imaging obtained: x-ray     Imaging outcome: foreign body not noted     Wound  exploration: wound explored through full range of motion     Contaminated: no   Treatment:    Area cleansed with:  Saline   Amount of cleaning:  Standard   Irrigation solution:  Sterile saline   Irrigation volume:  200cc   Irrigation method:  Syringe   Visualized foreign bodies/material removed: no     Debridement:  None   Undermining:  None   Scar revision: no   Skin repair:    Repair method:  Tissue adhesive Approximation:    Approximation:  Close Repair type:    Repair type:  Simple Post-procedure details:    Dressing:  Open (no dressing)   Procedure completion:  Tolerated well, no immediate complications   {Document cardiac monitor, telemetry assessment procedure when appropriate:1}  Medications Ordered in ED Medications  lidocaine-EPINEPHrine (XYLOCAINE W/EPI) 2 %-1:200000 (PF) injection 10 mL (has no administration in time range)  lidocaine-EPINEPHrine-tetracaine (LET) topical gel (3 mLs Topical Given 04/26/23 1245)    ED Course/ Medical Decision Making/ A&P   {   Click here for ABCD2, HEART and other calculatorsREFRESH Note before signing :1}                              Medical Decision Making Amount and/or Complexity of Data Reviewed Radiology: ordered.  Risk Prescription drug management.   This patient presents to the ED for concern of fall, this involves an extensive number of treatment options, and is a complaint that carries with it a high risk of complications and morbidity.  The differential diagnosis includes CVA, fracture, strain/dislocation, ligamentous/tendinous injury, neurovascular compromise   Co morbidities that complicate the patient evaluation  See HPI   Additional history obtained:  Additional history obtained from EMR External records from outside source obtained and reviewed including hospital records   Lab Tests:  N/a   Imaging Studies ordered:  I ordered imaging studies including CT head/cervical spine, x-ray right knee I  independently visualized and interpreted imaging which showed  CT head/cervical spine:*** Right knee x-ray:*** X-ray right hand:*** I agree with the radiologist interpretation   Cardiac Monitoring: / EKG:  The patient was maintained on a cardiac monitor.  I personally viewed and interpreted the cardiac monitored which showed an underlying rhythm of: Sinus rhythm   Consultations Obtained:  N/a   Problem List / ED Course / Critical interventions / Medication management  Fall, head trauma, laceration I ordered medication including LET  Reevaluation of the patient after these medicines showed that the patient improved I have reviewed the patients home medicines and have made adjustments as needed   Social Determinants of Health:  Denies tobacco, licit drug use   Test / Admission - Considered:  Fall, head trauma, laceration Vitals signs significant for hypertension blood pressure 140/73. Otherwise within normal range and stable throughout visit. Imaging studies significant for: See above *** Worrisome signs and symptoms were discussed with the patient, and the patient acknowledged understanding to return to the ED if noticed. Patient was stable upon discharge.    {Document critical care time when appropriate:1} {Document review of labs and clinical decision tools ie heart score, Chads2Vasc2 etc:1}  {Document your independent review of radiology images, and any outside records:1} {Document your discussion with family members, caretakers, and with consultants:1} {Document social determinants of health affecting pt's care:1} {Document your decision making why or why not admission, treatments were needed:1} Final Clinical Impression(s) / ED Diagnoses Final diagnoses:  None    Rx / DC Orders ED Discharge Orders     None

## 2023-04-26 NOTE — Discharge Instructions (Addendum)
As we discussed, your workup in the ER today was reassuring for acute findings.  CT imaging of your head and neck and x-ray imaging of your hand and knee did not reveal any emergent findings.  I have given you a referral to orthopedics with a number to call to schedule an appointment for follow-up if you are still having pain in the next few days.  In the interim, I recommend that you rest, ice, compress, and elevate areas that are sore and take on a as needed for pain.  Your right eyebrow laceration was glued closed today.  This glue should come off on its own in a week or so.  Do not pick at it.  You can shower normally and rinse with soap and water, please pat dry of this area and do not scrub it.  Monitor for any signs of infection including increasing redness, pain, or drainage as this would be caused to return for antibiotics.  Return if development of any new or worsening symptoms.

## 2023-04-28 DIAGNOSIS — T148XXA Other injury of unspecified body region, initial encounter: Secondary | ICD-10-CM | POA: Diagnosis not present

## 2023-04-28 DIAGNOSIS — S0083XA Contusion of other part of head, initial encounter: Secondary | ICD-10-CM | POA: Diagnosis not present

## 2023-05-30 ENCOUNTER — Telehealth: Payer: Self-pay | Admitting: *Deleted

## 2023-05-30 NOTE — Telephone Encounter (Signed)
Transition Care Management Unsuccessful Follow-up Telephone Call  Date of discharge and from where:  [facility]  04/26/2023  Attempts:  1st Attempt  Reason for unsuccessful TCM follow-up call:  Left voice message

## 2023-06-01 ENCOUNTER — Emergency Department (HOSPITAL_COMMUNITY): Payer: Medicare Other

## 2023-06-01 ENCOUNTER — Encounter (HOSPITAL_COMMUNITY): Payer: Self-pay

## 2023-06-01 ENCOUNTER — Emergency Department (HOSPITAL_COMMUNITY)
Admission: EM | Admit: 2023-06-01 | Discharge: 2023-06-01 | Disposition: A | Discharge status: Home / Self Care | Payer: Medicare Other | Attending: Emergency Medicine | Admitting: Emergency Medicine

## 2023-06-01 DIAGNOSIS — Y92009 Unspecified place in unspecified non-institutional (private) residence as the place of occurrence of the external cause: Secondary | ICD-10-CM | POA: Insufficient documentation

## 2023-06-01 DIAGNOSIS — E039 Hypothyroidism, unspecified: Secondary | ICD-10-CM | POA: Insufficient documentation

## 2023-06-01 DIAGNOSIS — S0993XA Unspecified injury of face, initial encounter: Secondary | ICD-10-CM | POA: Diagnosis not present

## 2023-06-01 DIAGNOSIS — S0081XA Abrasion of other part of head, initial encounter: Secondary | ICD-10-CM | POA: Diagnosis not present

## 2023-06-01 DIAGNOSIS — S0180XA Unspecified open wound of other part of head, initial encounter: Secondary | ICD-10-CM

## 2023-06-01 DIAGNOSIS — Z79890 Hormone replacement therapy (postmenopausal): Secondary | ICD-10-CM | POA: Insufficient documentation

## 2023-06-01 DIAGNOSIS — S0083XA Contusion of other part of head, initial encounter: Secondary | ICD-10-CM

## 2023-06-01 DIAGNOSIS — S0031XA Abrasion of nose, initial encounter: Secondary | ICD-10-CM | POA: Insufficient documentation

## 2023-06-01 DIAGNOSIS — I6782 Cerebral ischemia: Secondary | ICD-10-CM | POA: Diagnosis not present

## 2023-06-01 DIAGNOSIS — W010XXA Fall on same level from slipping, tripping and stumbling without subsequent striking against object, initial encounter: Secondary | ICD-10-CM | POA: Diagnosis not present

## 2023-06-01 DIAGNOSIS — Z853 Personal history of malignant neoplasm of breast: Secondary | ICD-10-CM | POA: Diagnosis not present

## 2023-06-01 DIAGNOSIS — S199XXA Unspecified injury of neck, initial encounter: Secondary | ICD-10-CM | POA: Diagnosis not present

## 2023-06-01 DIAGNOSIS — S0990XA Unspecified injury of head, initial encounter: Secondary | ICD-10-CM | POA: Diagnosis not present

## 2023-06-01 NOTE — Discharge Instructions (Addendum)
Thankfully today there are no broken bones in your face or neck.  No internal bleeding.  You need to apply Vaseline or Neosporin to the cut under your nose and do not blow your nose.  You can use saline spray if needed.  Use Tylenol Extra Strength every 6 hours as needed for pain.  You will be very sore over the next few days.

## 2023-06-01 NOTE — ED Triage Notes (Signed)
Pt arrived via POV, c/o facial pain, mainly in nose after mechanical fall this morning. States caught her toe, fell onto hardwood floor at home. Nose bleeding PTA but not currently bleeding in triage. States some minimal pain in bilat knees as well. Denies any LOC, no blood thinners. Denies any vision issues. Ambulatory in triage.

## 2023-06-01 NOTE — ED Provider Notes (Signed)
[place] EMERGENCY DEPARTMENT AT [facility] Provider Note   CSN: [id number] Arrival date & time: 06/01/23  2841     History  Chief Complaint  Patient presents with   Fall   Facial Injury    Jamie Cole is a 82 y.o. female.  Patient is an 82 year old female with a history of hypothyroidism, breast cancer in remission who is presenting today after a trip and fall at home.  She reports she is not exactly what caused her fall whether she caught her foot on the corner of the door frame or tripped over something on the floor but she fell face first onto the hard floor.  She mostly hit her nose on the floor but reports that it started bleeding uncontrollably.  She denies any loss of consciousness reports that her teeth feel normal.  She was able to stand and walk after this.  She denies any pain in her wrists knees or legs.  She does not take any anticoagulation and reports her tetanus shot is up-to-date.  The history is provided by the patient.  Fall  Facial Injury      Home Medications Prior to Admission medications   Medication Sig Start Date End Date Taking? Authorizing Provider  ANUCORT-HC 25 MG suppository Place 25 mg rectally 2 (two) times daily as needed for hemorrhoids. 12/31/18   [provider]  Ascorbic Acid (VITAMIN C) 1000 MG tablet Take 1,000 mg by mouth daily.    [provider]  Calcium Carb-Cholecalciferol (CALCIUM 600+D3 PO) Take 1 tablet by mouth 3 (three) times daily.    [provider]  levothyroxine (SYNTHROID) 112 MCG tablet Take 112 mcg by mouth See admin instructions. Take 0.5 tablet (56 mcg) by mouth on Sundays & Wednesdays in the morning, take 1 tablet (112 mcg) by mouth on all other days of the week.    [provider]  loratadine (CLARITIN) 10 MG tablet Take 1 tablet (10 mg total) by mouth daily for 14 days. 11/27/21 12/11/21  Carlisle Beers, FNP  Magnesium Citrate 200 MG TABS Take 200 mg by mouth 2  (two) times daily.    [provider]  RESVERATROL PO Take 500 mg by mouth daily.    [provider]  Vitamin D, Ergocalciferol, (DRISDOL) 1.25 MG (50000 UNIT) CAPS capsule TAKE 1 CAPSULE BY MOUTH EVERY 7 DAYS 05/04/21   Jerene Bears, MD      Allergies    Other    Review of Systems   Review of Systems  Physical Exam Updated Vital Signs BP (!) 180/88 (BP Location: Left Arm)   Pulse 89   Temp 98.1 F (36.7 C) (Oral)   Resp 11   LMP 08/02/1995   SpO2 99%  Physical Exam Vitals and nursing note reviewed.  Constitutional:      General: She is not in acute distress.    Appearance: She is well-developed.  HENT:     Head: Normocephalic and atraumatic.     Right Ear: Tympanic membrane normal.     Left Ear: Tympanic membrane normal.     Nose: Signs of injury and nasal tenderness present.     Comments: Significant ecchymosis and swelling over the nose.  Abrasion of the external nasal septum with small skin avulsion.  No deep lacerations noted.  No septal hematomas.  Abrasion to the chin.  No mandibular tenderness and normal jaw opening and closing. Eyes:     Pupils: Pupils are equal, round, and  reactive to light.  Cardiovascular:     Rate and Rhythm: Normal rate and regular rhythm.     Heart sounds: Normal heart sounds. No murmur heard.    No friction rub.  Pulmonary:     Effort: Pulmonary effort is normal.     Breath sounds: Normal breath sounds. No wheezing or rales.  Musculoskeletal:        General: No tenderness. Normal range of motion.     Cervical back: Normal range of motion and neck supple. No tenderness.     Comments: No edema  Skin:    General: Skin is warm and dry.     Findings: No rash.  Neurological:     Mental Status: She is alert and oriented to person, place, and time.     Cranial Nerves: No cranial nerve deficit.  Psychiatric:        Behavior: Behavior normal.     ED Results / Procedures / Treatments   Labs (all labs ordered are listed,  but only abnormal results are displayed) Labs Reviewed - No data to display  EKG None  Radiology CT Head Wo Contrast  Result Date: 06/01/2023 CLINICAL DATA:  Neck trauma.  Mechanical fall this morning. EXAM: CT HEAD WITHOUT CONTRAST CT MAXILLOFACIAL WITHOUT CONTRAST CT CERVICAL SPINE WITHOUT CONTRAST TECHNIQUE: Multidetector CT imaging of the head, cervical spine, and maxillofacial structures were performed using the standard protocol without intravenous contrast. Multiplanar CT image reconstructions of the cervical spine and maxillofacial structures were also generated. RADIATION DOSE REDUCTION: This exam was performed according to the departmental dose-optimization program which includes automated exposure control, adjustment of the mA and/or kV according to patient size and/or use of iterative reconstruction technique. COMPARISON:  04/26/2023 FINDINGS: CT HEAD FINDINGS Brain: No evidence of acute infarction, hemorrhage, hydrocephalus, extra-axial collection or mass lesion/mass effect. Generalized atrophy and chronic small vessel ischemia that is mild for age. Vascular: No hyperdense vessel or unexpected calcification. Skull: Normal. Negative for fracture or focal lesion. CT MAXILLOFACIAL FINDINGS Osseous: No acute fracture or mandibular dislocation. Orbits: No evidence of injury Sinuses: No evidence of injury Soft tissues: Soft tissue swelling over the nose. No opaque foreign body or hematoma. CT CERVICAL SPINE FINDINGS Alignment: No traumatic malalignment Skull base and vertebrae: No acute fracture.  Generalized osteopenia Soft tissues and spinal canal: No prevertebral fluid or swelling. No visible canal hematoma. Disc levels: Generalized degenerative endplate and facet spurring. Facet spurring is asymmetrically bulky on the right. Upper chest: No evidence of injury IMPRESSION: No evidence of acute intracranial or cervical spine injury. Negative for facial fracture. Electronically Signed   By: Tiburcio Pea M.D.   On: 06/01/2023 12:23   CT Maxillofacial Wo Contrast  Result Date: 06/01/2023 CLINICAL DATA:  Neck trauma.  Mechanical fall this morning. EXAM: CT HEAD WITHOUT CONTRAST CT MAXILLOFACIAL WITHOUT CONTRAST CT CERVICAL SPINE WITHOUT CONTRAST TECHNIQUE: Multidetector CT imaging of the head, cervical spine, and maxillofacial structures were performed using the standard protocol without intravenous contrast. Multiplanar CT image reconstructions of the cervical spine and maxillofacial structures were also generated. RADIATION DOSE REDUCTION: This exam was performed according to the departmental dose-optimization program which includes automated exposure control, adjustment of the mA and/or kV according to patient size and/or use of iterative reconstruction technique. COMPARISON:  04/26/2023 FINDINGS: CT HEAD FINDINGS Brain: No evidence of acute infarction, hemorrhage, hydrocephalus, extra-axial collection or mass lesion/mass effect. Generalized atrophy and chronic small vessel ischemia that is mild for age. Vascular: No hyperdense vessel or unexpected  calcification. Skull: Normal. Negative for fracture or focal lesion. CT MAXILLOFACIAL FINDINGS Osseous: No acute fracture or mandibular dislocation. Orbits: No evidence of injury Sinuses: No evidence of injury Soft tissues: Soft tissue swelling over the nose. No opaque foreign body or hematoma. CT CERVICAL SPINE FINDINGS Alignment: No traumatic malalignment Skull base and vertebrae: No acute fracture.  Generalized osteopenia Soft tissues and spinal canal: No prevertebral fluid or swelling. No visible canal hematoma. Disc levels: Generalized degenerative endplate and facet spurring. Facet spurring is asymmetrically bulky on the right. Upper chest: No evidence of injury IMPRESSION: No evidence of acute intracranial or cervical spine injury. Negative for facial fracture. Electronically Signed   By: Tiburcio Pea M.D.   On: 06/01/2023 12:23   CT Cervical  Spine Wo Contrast  Result Date: 06/01/2023 CLINICAL DATA:  Neck trauma.  Mechanical fall this morning. EXAM: CT HEAD WITHOUT CONTRAST CT MAXILLOFACIAL WITHOUT CONTRAST CT CERVICAL SPINE WITHOUT CONTRAST TECHNIQUE: Multidetector CT imaging of the head, cervical spine, and maxillofacial structures were performed using the standard protocol without intravenous contrast. Multiplanar CT image reconstructions of the cervical spine and maxillofacial structures were also generated. RADIATION DOSE REDUCTION: This exam was performed according to the departmental dose-optimization program which includes automated exposure control, adjustment of the mA and/or kV according to patient size and/or use of iterative reconstruction technique. COMPARISON:  04/26/2023 FINDINGS: CT HEAD FINDINGS Brain: No evidence of acute infarction, hemorrhage, hydrocephalus, extra-axial collection or mass lesion/mass effect. Generalized atrophy and chronic small vessel ischemia that is mild for age. Vascular: No hyperdense vessel or unexpected calcification. Skull: Normal. Negative for fracture or focal lesion. CT MAXILLOFACIAL FINDINGS Osseous: No acute fracture or mandibular dislocation. Orbits: No evidence of injury Sinuses: No evidence of injury Soft tissues: Soft tissue swelling over the nose. No opaque foreign body or hematoma. CT CERVICAL SPINE FINDINGS Alignment: No traumatic malalignment Skull base and vertebrae: No acute fracture.  Generalized osteopenia Soft tissues and spinal canal: No prevertebral fluid or swelling. No visible canal hematoma. Disc levels: Generalized degenerative endplate and facet spurring. Facet spurring is asymmetrically bulky on the right. Upper chest: No evidence of injury IMPRESSION: No evidence of acute intracranial or cervical spine injury. Negative for facial fracture. Electronically Signed   By: Tiburcio Pea M.D.   On: 06/01/2023 12:23    Procedures Procedures    Medications Ordered in  ED Medications - No data to display  ED Course/ Medical Decision Making/ A&P                                 Medical Decision Making Amount and/or Complexity of Data Reviewed Radiology: ordered and independent interpretation performed. Decision-making details documented in ED Course.   Pt with multiple medical problems and comorbidities and presenting today with a complaint that caries a high risk for morbidity and mortality.  Here today after a fall face first onto the ground.  Superficial skin avulsion to the nasal septum but no deep lacerations that appear to need repair.  No cartilage exposure.  No septal hematomas.  CT of the face head and neck to evaluate for any underlying injury especially given flexion of the neck from hitting the face will rule out cervical fractures.  Neurovascularly intact at this time.  Tetanus up-to-date and no anticoagulation.  12:41 PM I have independently visualized and interpreted pt's images today.  CT without evidence of acute bleed in the brain or facial fracture.  Radiology reports no acute cervical or intracranial pathology and negative for facial fractures.  Findings discussed with the patient.  At this time patient is stable for discharge home.  Discussed localized wound care to the avulsion underneath her nose and Tylenol as needed for pain.  She is comfortable with this plan.  No social determinants affecting her discharge today.          Final Clinical Impression(s) / ED Diagnoses Final diagnoses:  Contusion of face, initial encounter  Avulsion of skin of face, initial encounter    Rx / DC Orders ED Discharge Orders     None         Gwyneth Sprout, MD 06/01/23 1242

## 2023-06-05 DIAGNOSIS — S0083XA Contusion of other part of head, initial encounter: Secondary | ICD-10-CM | POA: Diagnosis not present

## 2023-06-05 DIAGNOSIS — M818 Other osteoporosis without current pathological fracture: Secondary | ICD-10-CM | POA: Diagnosis not present

## 2023-06-05 DIAGNOSIS — W19XXXA Unspecified fall, initial encounter: Secondary | ICD-10-CM | POA: Diagnosis not present

## 2023-06-05 DIAGNOSIS — M51369 Other intervertebral disc degeneration, lumbar region without mention of lumbar back pain or lower extremity pain: Secondary | ICD-10-CM | POA: Diagnosis not present

## 2023-06-05 DIAGNOSIS — Z96651 Presence of right artificial knee joint: Secondary | ICD-10-CM | POA: Diagnosis not present

## 2023-06-22 ENCOUNTER — Encounter: Payer: Self-pay | Admitting: Oncology

## 2023-06-22 ENCOUNTER — Encounter: Payer: Self-pay | Admitting: Neurology

## 2023-06-22 ENCOUNTER — Ambulatory Visit: Payer: Medicare Other | Admitting: Neurology

## 2023-06-22 VITALS — BP 140/85 | HR 69 | Ht 64.0 in | Wt 169.0 lb

## 2023-06-22 DIAGNOSIS — R269 Unspecified abnormalities of gait and mobility: Secondary | ICD-10-CM | POA: Diagnosis not present

## 2023-06-22 NOTE — Progress Notes (Signed)
Chief Complaint  Patient presents with   Gait Problem    Rm 14 alone Pt is well, reports her gait has been an issue since Sept. She has noticed she trips over her L foot often. She has had two falls since Sept.    ASSESSMENT AND PLAN  Jamie Cole is a 82 y.o. female   Gait abnormality  Moderate left medial knee pain upon deep palpitation, cautious gait,  Her complaints of gait abnormality intermittent, can be related to aging, deconditioning, knee pain,  Is set up for physical therapy already,  Will call back to clinic if she continue to have gait issues,  DIAGNOSTIC DATA (LABS, IMAGING, TESTING) - I reviewed patient records, labs, notes, testing and imaging myself where available.   MEDICAL HISTORY:  Jamie Cole, is a 82 year old female seen in request by her primary care from [facility] medical associate Dr. Wylene Simmer, Adelfa Koh, for unsteady gait, initial evaluation June 22, 2023  History is obtained from the patient and review of electronic medical records. I personally reviewed pertinent available imaging films in PACS.   PMHx of  Right breast cancer, s/p right lobectomy in 2013, radiation Osteoporosis Right knee replacement,   She lives alone, independent in her daily activity, fell in September 2024, landed on her right side, did not need stitches at the right frontal region, no loss of consciousness, she was able to get up from the floor, drove herself to emergency room,  Since then, for few days she felt unsteady gait, but gradually improved, recently she had a few episode of suddenly left neck buckle, left toe caught on the floor, she began to use cane for safety, but generally holding the cane in her hand without relying on it  She denies significant low back pain, neck pain, no sensory loss, no bowel and bladder incontinence  Personally reviewed CT head without contrast June 01, 2023, no acute abnormality,  CT cervical spine, multilevel degenerative  changes, no evidence of significant canal foraminal stenosis  PHYSICAL EXAM:   Vitals:   06/22/23 1518 06/22/23 1530  BP: (!) 149/90 (!) 140/85  Pulse: 69 69  Weight: 169 lb (76.7 kg)   Height: 5\' 4"  (1.626 m)    Body mass index is 29.01 kg/m.  PHYSICAL EXAMNIATION:  Gen: NAD, conversant, well nourised, well groomed                     Cardiovascular: Regular rate rhythm, no peripheral edema, warm, nontender. Eyes: Conjunctivae clear without exudates or hemorrhage Neck: Supple, no carotid bruits. Pulmonary: Clear to auscultation bilaterally   NEUROLOGICAL EXAM:  MENTAL STATUS: Speech/cognition: Awake, alert, oriented to history taking and casual conversation CRANIAL NERVES: CN II: Visual fields are full to confrontation. Pupils are round equal and briskly reactive to light. CN III, IV, VI: extraocular movement are normal. No ptosis. CN V: Facial sensation is intact to light touch CN VII: Face is symmetric with normal eye closure  CN VIII: Hearing is normal to causal conversation. CN IX, X: Phonation is normal. CN XI: Head turning and shoulder shrug are intact  MOTOR: There is no pronator drift of out-stretched arms. Muscle bulk and tone are normal. Muscle strength is normal.  Moderate left median knee pain upon deep palpitation  REFLEXES: Reflexes are 2+ and symmetric at the biceps, triceps, knees, and ankles. Plantar responses are flexor.  SENSORY: Intact to light touch, pinprick and vibratory sensation are intact in fingers and toes.  COORDINATION:  There is no trunk or limb dysmetria noted.  GAIT/STANCE: Able to get up from seated position arm crossed, cautious, mildly unsteady  REVIEW OF SYSTEMS:  Full 14 system review of systems performed and notable only for as above All other review of systems were negative.   ALLERGIES: Allergies  Allergen Reactions   Other     PLEASE DO BLOOD PRESSURE IN LEFT ARM ONLY    HOME MEDICATIONS: Current Outpatient  Medications  Medication Sig Dispense Refill   ANUCORT-HC 25 MG suppository Place 25 mg rectally 2 (two) times daily as needed for hemorrhoids.     Ascorbic Acid (VITAMIN C) 1000 MG tablet Take 1,000 mg by mouth daily.     Calcium Carb-Cholecalciferol (CALCIUM 600+D3 PO) Take 1 tablet by mouth 3 (three) times daily.     Magnesium Citrate 200 MG TABS Take 200 mg by mouth 2 (two) times daily.     RESVERATROL PO Take 500 mg by mouth daily.     SYNTHROID 88 MCG tablet Take 1 tablet by mouth every morning.     Vitamin D, Ergocalciferol, (DRISDOL) 1.25 MG (50000 UNIT) CAPS capsule TAKE 1 CAPSULE BY MOUTH EVERY 7 DAYS 12 capsule 3   No current facility-administered medications for this visit.    PAST MEDICAL HISTORY: Past Medical History:  Diagnosis Date   Basal cell carcinoma of neck        Breast cancer (HCC) 08/31/2011   DCIS; right breast, ER/PR +, Her 2 neg   Complication of anesthesia    slow to wake up   Compressed cervical disc    compression deformity  at T12   Endometrial hyperplasia, simple    Endometrial polyp    Family history of adverse reaction to anesthesia 07-18-92   Mother died during hip surgery   Fracture    right arm   H/O TB skin testing 1970's   + INH rx   Hx of radiation therapy 10/18/11 to 11/18/11   right breast   Hx of radiation therapy 1957   4 tx to face for acne   Hypothyroidism    Metacarpal bone fracture 08/30/2012   Right 5th finger fracture   Osteoporosis    Personal history of radiation therapy     PAST SURGICAL HISTORY: Past Surgical History:  Procedure Laterality Date   BREAST LUMPECTOMY  2/13   sentinel node   endometrial polyp     HYSTEROSCOPY  12/00   polyps   MASTECTOMY, PARTIAL  09/14/11    patial right breast   TONSILLECTOMY     adenoids   TOTAL KNEE ARTHROPLASTY Right 07/08/2019   Procedure: TOTAL KNEE ARTHROPLASTY;  Surgeon: Ollen Gross, MD;  Location: [facility];  Service: Orthopedics;  Laterality: Right;     FAMILY  HISTORY: Family History  Problem Relation Age of Onset   Skin cancer Father 82   Stroke Father    Stomach cancer Paternal Grandmother 5   Breast cancer Paternal Aunt        x 2   Breast cancer Cousin        ages 45-60 at dx x 4 paternal   Prostate cancer Cousin        ????   Brain cancer Maternal Uncle        brain   Cancer Cousin        tonsillar   Stroke Brother    Colon cancer Neg Hx     SOCIAL HISTORY: Social History   Socioeconomic History  Marital status: Widowed    Spouse name: Not on file   Number of children: Not on file   Years of education: Not on file   Highest education level: Not on file  Occupational History   Not on file  Tobacco Use   Smoking status: Never   Smokeless tobacco: Never  Vaping Use   Vaping status: Never Used  Substance and Sexual Activity   Alcohol use: No   Drug use: No   Sexual activity: Not Currently    Birth control/protection: Post-menopausal    Comment: patient asked but does not feel important  Other Topics Concern   Not on file  Social History Narrative   Married, 1 son, retired [employer] officer   Social Determinants of Health   Financial Resource Strain: Not on file  Food Insecurity: Not on file  Transportation Needs: Not on file  Physical Activity: Not on file  Stress: Not on file  Social Connections: Not on file  Intimate Partner Violence: Not on file      Levert Feinstein, M.D. Ph.D.  [facility] [address], [address] [place], [place] [address] Ph: [phone] Fax: [phone]  CC:  Tisovec, Adelfa Koh, MD [address] [place],  [place] [address]  Tisovec, Adelfa Koh, MD

## 2023-06-27 DIAGNOSIS — R269 Unspecified abnormalities of gait and mobility: Secondary | ICD-10-CM | POA: Diagnosis not present

## 2023-06-27 DIAGNOSIS — M25511 Pain in right shoulder: Secondary | ICD-10-CM | POA: Diagnosis not present

## 2023-06-27 DIAGNOSIS — M545 Low back pain, unspecified: Secondary | ICD-10-CM | POA: Diagnosis not present

## 2023-07-04 DIAGNOSIS — R269 Unspecified abnormalities of gait and mobility: Secondary | ICD-10-CM | POA: Diagnosis not present

## 2023-07-04 DIAGNOSIS — M545 Low back pain, unspecified: Secondary | ICD-10-CM | POA: Diagnosis not present

## 2023-07-04 DIAGNOSIS — M25511 Pain in right shoulder: Secondary | ICD-10-CM | POA: Diagnosis not present

## 2023-07-12 DIAGNOSIS — M545 Low back pain, unspecified: Secondary | ICD-10-CM | POA: Diagnosis not present

## 2023-07-12 DIAGNOSIS — R269 Unspecified abnormalities of gait and mobility: Secondary | ICD-10-CM | POA: Diagnosis not present

## 2023-07-12 DIAGNOSIS — M25511 Pain in right shoulder: Secondary | ICD-10-CM | POA: Diagnosis not present

## 2023-07-13 DIAGNOSIS — H6502 Acute serous otitis media, left ear: Secondary | ICD-10-CM | POA: Diagnosis not present

## 2023-07-13 DIAGNOSIS — H9202 Otalgia, left ear: Secondary | ICD-10-CM | POA: Diagnosis not present

## 2023-07-28 DIAGNOSIS — R269 Unspecified abnormalities of gait and mobility: Secondary | ICD-10-CM | POA: Diagnosis not present

## 2023-07-28 DIAGNOSIS — M545 Low back pain, unspecified: Secondary | ICD-10-CM | POA: Diagnosis not present

## 2023-07-28 DIAGNOSIS — M25511 Pain in right shoulder: Secondary | ICD-10-CM | POA: Diagnosis not present

## 2023-07-31 ENCOUNTER — Encounter: Payer: Self-pay | Admitting: Oncology

## 2023-08-07 ENCOUNTER — Ambulatory Visit (HOSPITAL_BASED_OUTPATIENT_CLINIC_OR_DEPARTMENT_OTHER): Payer: Medicare Other | Admitting: Obstetrics & Gynecology

## 2023-08-07 ENCOUNTER — Encounter (HOSPITAL_BASED_OUTPATIENT_CLINIC_OR_DEPARTMENT_OTHER): Payer: Self-pay | Admitting: Obstetrics & Gynecology

## 2023-08-07 VITALS — BP 138/64 | HR 73 | Ht 64.0 in | Wt 167.6 lb

## 2023-08-07 DIAGNOSIS — M818 Other osteoporosis without current pathological fracture: Secondary | ICD-10-CM

## 2023-08-07 DIAGNOSIS — Z780 Asymptomatic menopausal state: Secondary | ICD-10-CM

## 2023-08-07 DIAGNOSIS — Z170 Estrogen receptor positive status [ER+]: Secondary | ICD-10-CM | POA: Diagnosis not present

## 2023-08-07 DIAGNOSIS — Z9189 Other specified personal risk factors, not elsewhere classified: Secondary | ICD-10-CM | POA: Diagnosis not present

## 2023-08-07 DIAGNOSIS — Z853 Personal history of malignant neoplasm of breast: Secondary | ICD-10-CM | POA: Diagnosis not present

## 2023-08-07 NOTE — Progress Notes (Signed)
 83 y.o. G60P1001 Widowed White or Caucasian female here for breast and pelvic exam.  I am also following her for h/o of ER positive breast cancer.  Has been released from oncology at this point.    Denies vaginal bleeding.  She is back on Prolia  due to her bone density.  Dr. Tisovec is administering this.    Patient's last menstrual period was 08/02/1995.          Sexually active: No.   Health Maintenance: PCP:  Dr. Tisovec.  Last wellness appt was August.  Did blood work at that appt:  yes Vaccines are up to date:  did not due a flu shot this year Colonoscopy:  10/11/2011 MMG:  0624/2024 Negative BMD:  01/23/2023, t score -2.5 Last pap smear:  07-29-17.   H/o abnormal pap smear:      reports that she has never smoked. She has never used smokeless tobacco. She reports that she does not drink alcohol  and does not use drugs.  Past Medical History:  Diagnosis Date   Basal cell carcinoma of neck        Breast cancer (HCC) 08/31/2011   DCIS; right breast, ER/PR +, Her 2 neg   Complication of anesthesia    slow to wake up   Compressed cervical disc    compression deformity  at T12   Endometrial hyperplasia, simple    Endometrial polyp    Family history of adverse reaction to anesthesia 1992/07/29   Mother died during hip surgery   Fracture    right arm   H/O TB skin testing 1970's   + INH rx   Hx of radiation therapy 10/18/11 to 11/18/11   right breast   Hx of radiation therapy 1957   4 tx to face for acne   Hypothyroidism    Metacarpal bone fracture 08/30/2012   Right 5th finger fracture   Osteoporosis    Personal history of radiation therapy     Past Surgical History:  Procedure Laterality Date   BREAST LUMPECTOMY  2/13   sentinel node   endometrial polyp     HYSTEROSCOPY  12/00   polyps   MASTECTOMY, PARTIAL  09/14/11    patial right breast   TONSILLECTOMY     adenoids   TOTAL KNEE ARTHROPLASTY Right Jul 30, 2019   Procedure: TOTAL KNEE ARTHROPLASTY;  Surgeon: Melodi Lerner,  MD;  Location: [facility];  Service: Orthopedics;  Laterality: Right;     Current Outpatient Medications  Medication Sig Dispense Refill   ANUCORT-HC  25 MG suppository Place 25 mg rectally 2 (two) times daily as needed for hemorrhoids.     Ascorbic Acid (VITAMIN C) 1000 MG tablet Take 1,000 mg by mouth daily.     Calcium Carb-Cholecalciferol (CALCIUM 600+D3 PO) Take 1 tablet by mouth 3 (three) times daily.     Magnesium Citrate 200 MG TABS Take 200 mg by mouth 2 (two) times daily.     RESVERATROL PO Take 500 mg by mouth daily.     SYNTHROID  88 MCG tablet Take 1 tablet by mouth every morning.     Vitamin D , Ergocalciferol , (DRISDOL ) 1.25 MG (50000 UNIT) CAPS capsule TAKE 1 CAPSULE BY MOUTH EVERY 7 DAYS 12 capsule 3   No current facility-administered medications for this visit.    Family History  Problem Relation Age of Onset   Skin cancer Father 42   Stroke Father    Stomach cancer Paternal Grandmother 37   Breast cancer Paternal Aunt  x 2   Breast cancer Cousin        ages 70-60 at dx x 4 paternal   Prostate cancer Cousin        ????   Brain cancer Maternal Uncle        brain   Cancer Cousin        tonsillar   Stroke Brother    Colon cancer Neg Hx     Review of Systems  Constitutional: Negative.   Genitourinary: Negative.     Exam:   BP 138/64 (BP Location: Left Arm, Patient Position: Sitting, Cuff Size: Large)   Pulse 73   Ht 5' 4 (1.626 m)   Wt 167 lb 9.6 oz (76 kg)   LMP 08/02/1995   BMI 28.77 kg/m   Height: 5' 4 (162.6 cm)  General appearance: alert, cooperative and appears stated age Breasts: normal appearance, no masses or tenderness Abdomen: soft, non-tender; bowel sounds normal; no masses,  no organomegaly Lymph nodes: Cervical, supraclavicular, and axillary nodes normal.  No abnormal inguinal nodes palpated Neurologic: Grossly normal  Pelvic: External genitalia:  no lesions              Urethra:  normal appearing urethra with no masses,  tenderness or lesions              Bartholins and Skenes: normal                 Vagina: normal appearing vagina with atrophic changes and no discharge, no lesions              Cervix: no lesions              Pap taken: No. Bimanual Exam:  Uterus:  normal size, contour, position, consistency, mobility, non-tender              Adnexa: normal adnexa               Rectovaginal: Confirms               Anus:  normal sphincter tone, no lesions  Chaperone, Bascom Kotyk, CMA, was present for exam.  Assessment/Plan: 1. GYN exam for high-risk Medicare patient (Primary) - Pap smear not indicated - Mammogram 12/2022 - Colonoscopy 2013 - Bone mineral density 12/2022 - lab work done with PCP, Dr. Tisovec - vaccines reviewed/updated  2. Malignant neoplasm of upper-outer quadrant of right breast in female, estrogen receptor positive (HCC) - released from oncology  3. Osteoporosis due to aromatase inhibitor - will restart prolia   4. Postmenopausal

## 2023-08-09 DIAGNOSIS — R269 Unspecified abnormalities of gait and mobility: Secondary | ICD-10-CM | POA: Diagnosis not present

## 2023-08-09 DIAGNOSIS — M25511 Pain in right shoulder: Secondary | ICD-10-CM | POA: Diagnosis not present

## 2023-08-09 DIAGNOSIS — M545 Low back pain, unspecified: Secondary | ICD-10-CM | POA: Diagnosis not present

## 2023-08-16 DIAGNOSIS — M25511 Pain in right shoulder: Secondary | ICD-10-CM | POA: Diagnosis not present

## 2023-08-16 DIAGNOSIS — R269 Unspecified abnormalities of gait and mobility: Secondary | ICD-10-CM | POA: Diagnosis not present

## 2023-08-16 DIAGNOSIS — M545 Low back pain, unspecified: Secondary | ICD-10-CM | POA: Diagnosis not present

## 2023-08-17 DIAGNOSIS — Z170 Estrogen receptor positive status [ER+]: Secondary | ICD-10-CM | POA: Diagnosis not present

## 2023-08-17 DIAGNOSIS — M818 Other osteoporosis without current pathological fracture: Secondary | ICD-10-CM | POA: Diagnosis not present

## 2023-08-17 DIAGNOSIS — M160 Bilateral primary osteoarthritis of hip: Secondary | ICD-10-CM | POA: Diagnosis not present

## 2023-08-17 DIAGNOSIS — R7302 Impaired glucose tolerance (oral): Secondary | ICD-10-CM | POA: Diagnosis not present

## 2023-08-17 DIAGNOSIS — H9190 Unspecified hearing loss, unspecified ear: Secondary | ICD-10-CM | POA: Diagnosis not present

## 2023-08-17 DIAGNOSIS — E039 Hypothyroidism, unspecified: Secondary | ICD-10-CM | POA: Diagnosis not present

## 2023-08-17 DIAGNOSIS — K635 Polyp of colon: Secondary | ICD-10-CM | POA: Diagnosis not present

## 2023-08-17 DIAGNOSIS — E559 Vitamin D deficiency, unspecified: Secondary | ICD-10-CM | POA: Diagnosis not present

## 2023-08-17 DIAGNOSIS — Z96651 Presence of right artificial knee joint: Secondary | ICD-10-CM | POA: Diagnosis not present

## 2023-08-17 DIAGNOSIS — E663 Overweight: Secondary | ICD-10-CM | POA: Diagnosis not present

## 2023-08-17 DIAGNOSIS — E7800 Pure hypercholesterolemia, unspecified: Secondary | ICD-10-CM | POA: Diagnosis not present

## 2023-08-17 DIAGNOSIS — C50411 Malignant neoplasm of upper-outer quadrant of right female breast: Secondary | ICD-10-CM | POA: Diagnosis not present

## 2023-08-24 ENCOUNTER — Other Ambulatory Visit (HOSPITAL_COMMUNITY): Payer: Self-pay

## 2023-08-28 ENCOUNTER — Encounter: Payer: Self-pay | Admitting: Oncology

## 2023-08-28 ENCOUNTER — Ambulatory Visit (HOSPITAL_COMMUNITY)
Admission: RE | Admit: 2023-08-28 | Discharge: 2023-08-28 | Disposition: A | Discharge status: Home / Self Care | Payer: Medicare Other | Source: Ambulatory Visit | Attending: Internal Medicine | Admitting: Internal Medicine

## 2023-08-28 DIAGNOSIS — M810 Age-related osteoporosis without current pathological fracture: Secondary | ICD-10-CM | POA: Insufficient documentation

## 2023-08-28 MED ORDER — DENOSUMAB 60 MG/ML ~~LOC~~ SOSY
PREFILLED_SYRINGE | SUBCUTANEOUS | Status: AC
  Administered 2023-08-28: 60 mg via SUBCUTANEOUS
  Filled 2023-08-28: qty 1

## 2023-08-28 MED ORDER — DENOSUMAB 60 MG/ML ~~LOC~~ SOSY: 60.0000 mg | PREFILLED_SYRINGE | Freq: Once | SUBCUTANEOUS | Status: AC

## 2023-09-01 DIAGNOSIS — M545 Low back pain, unspecified: Secondary | ICD-10-CM | POA: Diagnosis not present

## 2023-09-01 DIAGNOSIS — M25511 Pain in right shoulder: Secondary | ICD-10-CM | POA: Diagnosis not present

## 2023-09-01 DIAGNOSIS — R269 Unspecified abnormalities of gait and mobility: Secondary | ICD-10-CM | POA: Diagnosis not present

## 2023-10-02 ENCOUNTER — Other Ambulatory Visit: Payer: Self-pay | Admitting: Obstetrics & Gynecology

## 2023-10-02 DIAGNOSIS — Z1231 Encounter for screening mammogram for malignant neoplasm of breast: Secondary | ICD-10-CM

## 2023-11-21 DIAGNOSIS — H9202 Otalgia, left ear: Secondary | ICD-10-CM | POA: Diagnosis not present

## 2023-11-21 DIAGNOSIS — H6503 Acute serous otitis media, bilateral: Secondary | ICD-10-CM | POA: Diagnosis not present

## 2023-12-11 DIAGNOSIS — L821 Other seborrheic keratosis: Secondary | ICD-10-CM | POA: Diagnosis not present

## 2023-12-11 DIAGNOSIS — L814 Other melanin hyperpigmentation: Secondary | ICD-10-CM | POA: Diagnosis not present

## 2023-12-11 DIAGNOSIS — Z85828 Personal history of other malignant neoplasm of skin: Secondary | ICD-10-CM | POA: Diagnosis not present

## 2023-12-11 DIAGNOSIS — Z86018 Personal history of other benign neoplasm: Secondary | ICD-10-CM | POA: Diagnosis not present

## 2023-12-11 DIAGNOSIS — D225 Melanocytic nevi of trunk: Secondary | ICD-10-CM | POA: Diagnosis not present

## 2023-12-11 DIAGNOSIS — L820 Inflamed seborrheic keratosis: Secondary | ICD-10-CM | POA: Diagnosis not present

## 2023-12-11 DIAGNOSIS — L578 Other skin changes due to chronic exposure to nonionizing radiation: Secondary | ICD-10-CM | POA: Diagnosis not present

## 2023-12-29 DIAGNOSIS — M79671 Pain in right foot: Secondary | ICD-10-CM | POA: Diagnosis not present

## 2024-01-04 DIAGNOSIS — M79642 Pain in left hand: Secondary | ICD-10-CM | POA: Diagnosis not present

## 2024-01-04 DIAGNOSIS — M25562 Pain in left knee: Secondary | ICD-10-CM | POA: Diagnosis not present

## 2024-01-04 DIAGNOSIS — M25561 Pain in right knee: Secondary | ICD-10-CM | POA: Diagnosis not present

## 2024-01-04 DIAGNOSIS — M79641 Pain in right hand: Secondary | ICD-10-CM | POA: Diagnosis not present

## 2024-01-19 ENCOUNTER — Telehealth: Payer: Self-pay | Admitting: Pharmacy Technician

## 2024-01-19 NOTE — Telephone Encounter (Signed)
 Auth Submission: NO AUTH NEEDED Site of care: Site of care: [facility] Payer: MEDICARE A/B & [employer] [place] Medication & CPT/J Code(s) submitted: Prolia  (Denosumab ) [id number] Diagnosis Code:  Route of submission (phone, fax, portal):  Phone # Fax # Auth type: Buy/Bill HB Units/visits requested: 60MG  [id number] X2 Reference number:  Approval from: 01/19/24 to 07/31/24

## 2024-01-22 DIAGNOSIS — M79642 Pain in left hand: Secondary | ICD-10-CM | POA: Diagnosis not present

## 2024-01-22 DIAGNOSIS — M25561 Pain in right knee: Secondary | ICD-10-CM | POA: Diagnosis not present

## 2024-01-22 DIAGNOSIS — M79641 Pain in right hand: Secondary | ICD-10-CM | POA: Diagnosis not present

## 2024-01-22 DIAGNOSIS — M25562 Pain in left knee: Secondary | ICD-10-CM | POA: Diagnosis not present

## 2024-01-24 ENCOUNTER — Ambulatory Visit
Admission: RE | Admit: 2024-01-24 | Discharge: 2024-01-24 | Discharge status: Home / Self Care | Source: Ambulatory Visit | Attending: Obstetrics & Gynecology | Admitting: Obstetrics & Gynecology

## 2024-01-24 DIAGNOSIS — Z961 Presence of intraocular lens: Secondary | ICD-10-CM | POA: Diagnosis not present

## 2024-01-24 DIAGNOSIS — Z1231 Encounter for screening mammogram for malignant neoplasm of breast: Secondary | ICD-10-CM | POA: Diagnosis not present

## 2024-01-24 DIAGNOSIS — H52203 Unspecified astigmatism, bilateral: Secondary | ICD-10-CM | POA: Diagnosis not present

## 2024-01-24 DIAGNOSIS — H4323 Crystalline deposits in vitreous body, bilateral: Secondary | ICD-10-CM | POA: Diagnosis not present

## 2024-02-09 DIAGNOSIS — E7849 Other hyperlipidemia: Secondary | ICD-10-CM | POA: Diagnosis not present

## 2024-02-09 DIAGNOSIS — R7302 Impaired glucose tolerance (oral): Secondary | ICD-10-CM | POA: Diagnosis not present

## 2024-02-09 DIAGNOSIS — E559 Vitamin D deficiency, unspecified: Secondary | ICD-10-CM | POA: Diagnosis not present

## 2024-02-09 DIAGNOSIS — E7800 Pure hypercholesterolemia, unspecified: Secondary | ICD-10-CM | POA: Diagnosis not present

## 2024-02-09 DIAGNOSIS — E039 Hypothyroidism, unspecified: Secondary | ICD-10-CM | POA: Diagnosis not present

## 2024-02-16 DIAGNOSIS — E039 Hypothyroidism, unspecified: Secondary | ICD-10-CM | POA: Diagnosis not present

## 2024-02-16 DIAGNOSIS — M160 Bilateral primary osteoarthritis of hip: Secondary | ICD-10-CM | POA: Diagnosis not present

## 2024-02-16 DIAGNOSIS — H9190 Unspecified hearing loss, unspecified ear: Secondary | ICD-10-CM | POA: Diagnosis not present

## 2024-02-16 DIAGNOSIS — K635 Polyp of colon: Secondary | ICD-10-CM | POA: Diagnosis not present

## 2024-02-16 DIAGNOSIS — M818 Other osteoporosis without current pathological fracture: Secondary | ICD-10-CM | POA: Diagnosis not present

## 2024-02-16 DIAGNOSIS — D0590 Unspecified type of carcinoma in situ of unspecified breast: Secondary | ICD-10-CM | POA: Diagnosis not present

## 2024-02-16 DIAGNOSIS — E559 Vitamin D deficiency, unspecified: Secondary | ICD-10-CM | POA: Diagnosis not present

## 2024-02-16 DIAGNOSIS — R82998 Other abnormal findings in urine: Secondary | ICD-10-CM | POA: Diagnosis not present

## 2024-02-16 DIAGNOSIS — C50411 Malignant neoplasm of upper-outer quadrant of right female breast: Secondary | ICD-10-CM | POA: Diagnosis not present

## 2024-02-16 DIAGNOSIS — Z170 Estrogen receptor positive status [ER+]: Secondary | ICD-10-CM | POA: Diagnosis not present

## 2024-02-16 DIAGNOSIS — E7800 Pure hypercholesterolemia, unspecified: Secondary | ICD-10-CM | POA: Diagnosis not present

## 2024-02-16 DIAGNOSIS — E663 Overweight: Secondary | ICD-10-CM | POA: Diagnosis not present

## 2024-02-16 DIAGNOSIS — R7302 Impaired glucose tolerance (oral): Secondary | ICD-10-CM | POA: Diagnosis not present

## 2024-02-22 ENCOUNTER — Other Ambulatory Visit (HOSPITAL_COMMUNITY): Payer: Self-pay

## 2024-02-26 ENCOUNTER — Ambulatory Visit (HOSPITAL_COMMUNITY)
Admission: RE | Admit: 2024-02-26 | Discharge: 2024-02-26 | Disposition: A | Discharge status: Home / Self Care | Source: Ambulatory Visit | Attending: Internal Medicine | Admitting: Internal Medicine

## 2024-02-26 DIAGNOSIS — M810 Age-related osteoporosis without current pathological fracture: Secondary | ICD-10-CM | POA: Diagnosis not present

## 2024-02-26 MED ORDER — DENOSUMAB 60 MG/ML ~~LOC~~ SOSY
60.0000 mg | PREFILLED_SYRINGE | Freq: Once | SUBCUTANEOUS | Status: AC
  Administered 2024-02-26: 60 mg via SUBCUTANEOUS

## 2024-02-26 MED ORDER — DENOSUMAB 60 MG/ML ~~LOC~~ SOSY
PREFILLED_SYRINGE | SUBCUTANEOUS | Status: AC
  Filled 2024-02-26: qty 1

## 2024-07-09 ENCOUNTER — Other Ambulatory Visit (HOSPITAL_COMMUNITY): Payer: Self-pay | Admitting: Internal Medicine

## 2024-07-09 DIAGNOSIS — T50905A Adverse effect of unspecified drugs, medicaments and biological substances, initial encounter: Secondary | ICD-10-CM | POA: Insufficient documentation

## 2024-08-07 ENCOUNTER — Other Ambulatory Visit: Payer: Self-pay | Admitting: Internal Medicine

## 2024-08-07 DIAGNOSIS — Z1231 Encounter for screening mammogram for malignant neoplasm of breast: Secondary | ICD-10-CM

## 2024-08-29 ENCOUNTER — Inpatient Hospital Stay (HOSPITAL_COMMUNITY): Admission: RE | Admit: 2024-08-29 | Source: Ambulatory Visit

## 2025-01-24 ENCOUNTER — Ambulatory Visit

## 2025-02-27 ENCOUNTER — Encounter (HOSPITAL_COMMUNITY)
